# Patient Record
Sex: Female | Born: 1955 | Race: White | Hispanic: No | Marital: Married | Smoking: Former smoker
Health system: Southern US, Community
[De-identification: ages and names within clinical notes are randomized; demographics above are authoritative.]

## PROBLEM LIST (undated history)

## (undated) DIAGNOSIS — M810 Age-related osteoporosis without current pathological fracture: Secondary | ICD-10-CM

## (undated) DIAGNOSIS — N200 Calculus of kidney: Secondary | ICD-10-CM

## (undated) DIAGNOSIS — K862 Cyst of pancreas: Secondary | ICD-10-CM

## (undated) DIAGNOSIS — R978 Other abnormal tumor markers: Secondary | ICD-10-CM

## (undated) DIAGNOSIS — T7840XA Allergy, unspecified, initial encounter: Secondary | ICD-10-CM

## (undated) DIAGNOSIS — I10 Essential (primary) hypertension: Secondary | ICD-10-CM

## (undated) DIAGNOSIS — E785 Hyperlipidemia, unspecified: Secondary | ICD-10-CM

## (undated) DIAGNOSIS — R42 Dizziness and giddiness: Secondary | ICD-10-CM

## (undated) HISTORY — DX: Other abnormal tumor markers: R97.8

## (undated) HISTORY — DX: Cyst of pancreas: K86.2

## (undated) HISTORY — PX: CARDIAC CATHETERIZATION: SHX172

## (undated) HISTORY — DX: Essential (primary) hypertension: I10

## (undated) HISTORY — PX: COLONOSCOPY: SHX174

## (undated) HISTORY — DX: Calculus of kidney: N20.0

## (undated) HISTORY — DX: Allergy, unspecified, initial encounter: T78.40XA

## (undated) HISTORY — DX: Hyperlipidemia, unspecified: E78.5

---

## 2014-07-29 LAB — CBC AND DIFFERENTIAL
HEMATOCRIT: 44 (ref 36–46)
HEMOGLOBIN: 14.8 (ref 12.0–16.0)
Platelets: 219 (ref 150–399)
WBC: 5.5

## 2014-07-29 LAB — LIPID PANEL
Cholesterol: 243 — AB (ref 0–200)
HDL: 85 — AB (ref 35–70)
LDL CALC: 136
TRIGLYCERIDES: 74 (ref 40–160)

## 2014-07-29 LAB — HEPATIC FUNCTION PANEL
ALT: 14 (ref 7–35)
AST: 17 (ref 13–35)
Alkaline Phosphatase: 96 (ref 25–125)
BILIRUBIN, TOTAL: 0.6

## 2014-07-29 LAB — BASIC METABOLIC PANEL
BUN: 9 (ref 4–21)
Creatinine: 0.7 (ref 0.5–1.1)
Glucose: 90
Potassium: 3.7 (ref 3.4–5.3)
SODIUM: 145 (ref 137–147)

## 2014-07-29 LAB — TSH: TSH: 0.53 (ref 0.41–5.90)

## 2015-11-08 LAB — HEPATIC FUNCTION PANEL
ALK PHOS: 94 (ref 25–125)
ALT: 14 (ref 7–35)
AST: 17 (ref 13–35)

## 2015-11-08 LAB — CBC AND DIFFERENTIAL
HEMATOCRIT: 43 (ref 36–46)
HEMOGLOBIN: 14.7 (ref 12.0–16.0)
Platelets: 225 (ref 150–399)
WBC: 5.6

## 2015-11-08 LAB — LIPID PANEL
Cholesterol: 276 — AB (ref 0–200)
HDL: 109 — AB (ref 35–70)
LDL CALC: 157
TRIGLYCERIDES: 51 (ref 40–160)

## 2015-11-08 LAB — TSH: TSH: 0.65 (ref 0.41–5.90)

## 2015-11-08 LAB — BASIC METABOLIC PANEL
BUN: 11 (ref 4–21)
Creatinine: 0.7 (ref 0.5–1.1)
GLUCOSE: 84
Potassium: 3.9 (ref 3.4–5.3)
Sodium: 145 (ref 137–147)

## 2017-04-03 ENCOUNTER — Ambulatory Visit (INDEPENDENT_AMBULATORY_CARE_PROVIDER_SITE_OTHER): Payer: No Typology Code available for payment source | Admitting: Family Medicine

## 2017-04-03 ENCOUNTER — Encounter: Payer: Self-pay | Admitting: Family Medicine

## 2017-04-03 VITALS — BP 110/68 | HR 86 | Resp 12 | Ht 63.5 in | Wt 96.1 lb

## 2017-04-03 DIAGNOSIS — E785 Hyperlipidemia, unspecified: Secondary | ICD-10-CM

## 2017-04-03 DIAGNOSIS — I10 Essential (primary) hypertension: Secondary | ICD-10-CM | POA: Diagnosis not present

## 2017-04-03 DIAGNOSIS — K862 Cyst of pancreas: Secondary | ICD-10-CM

## 2017-04-03 DIAGNOSIS — N83202 Unspecified ovarian cyst, left side: Secondary | ICD-10-CM | POA: Diagnosis not present

## 2017-04-03 DIAGNOSIS — Z8249 Family history of ischemic heart disease and other diseases of the circulatory system: Secondary | ICD-10-CM | POA: Diagnosis not present

## 2017-04-03 MED ORDER — AMLODIPINE BESYLATE 2.5 MG PO TABS: 2.5000 mg | ORAL_TABLET | Freq: Every day | ORAL | 0 refills | Status: DC

## 2017-04-03 MED ORDER — ATORVASTATIN CALCIUM 10 MG PO TABS: 10.0000 mg | ORAL_TABLET | Freq: Every day | ORAL | 0 refills | Status: DC

## 2017-04-03 NOTE — Progress Notes (Signed)
HPI:   [name] is a 61 y.o. female, who is here today to establish care with me.  Former PCP: Dr. Sheral Flow. She moved from [place] in 01/2017. Last preventive routine visit: 01/03/17.  Chronic medical problems: HTN,HLD,FHx of CAD, nephrolithiasis, "fainting spells with negative work-up (including cardiac cath and "numerous test" during hospitalization (03/2016).  She lives with her husband. She exercises regularly and follow a healthy diet.  Hypertension:   Diagnosed around 8-9 years ago. Currently on Amlodipine 2.5 mg daily  She follows low salt diet. Last eye exam within a year. BP readings: 110's-120's/60's She is taking medications as instructed, no side effects reported.  She has not noted unusual headache, visual changes, exertional chest pain, dyspnea,  focal weakness, or edema.   Lab Results  Component Value Date   CREATININE 0.7 11/08/2015   BUN 11 11/08/2015   NA 145 11/08/2015   K 3.9 11/08/2015     Hyperlipidemia: Currently on Atorvastatin 10 mg daily. Following a low fat diet: Yes  She has not noted side effects with medication.  12/2016 last FLP and started statin medication in 01/2017. Denies PMHx of CVD.  FHx positive for CAD, she is not on Aspirin because did not tolerate it well.  Concerns today: Ovarian cyst and pancreatic cyst + CA 19-9   Today she presents copy of abdominal MRI she had done on 12/05/2015. She also had CA 19-9 done, it was elevated at 41. According to patient she was already evaluated by GI and there was not a concern about imaging and laboratory findings. She is asymptomatic, denies alcohol abuse or tobacco use (former smoker).   Her father died from pancreatic cancer at age 11. According to patient, he was exposed to different chemicals that could have contributed to illness.  She denies any abdominal pain, nausea, vomiting, or abnormal weight loss. She is not sure was the indication for abdominal MRI and/or CA  19-9.  She is also reporting incidental ovarian cyst that was seeing when she was undergoing workup for nephrolithiasis in 07/2016. According to patient, a left ovarian cyst was found, 6 months follow-up was recommended. She denies pelvic pain, vaginal bleeding, or abnormal vaginal discharge.   Her mother was recently Dx with "ovarian mass." According to patient, surgeon is certain it is malignant but given her age surgical options have not been considered yet.    Review of Systems  Constitutional: Negative for activity change, appetite change, fatigue, fever and unexpected weight change.  HENT: Negative for mouth sores, nosebleeds, sore throat and trouble swallowing.   Eyes: Negative for redness and visual disturbance.  Respiratory: Negative for cough, shortness of breath and wheezing.   Cardiovascular: Negative for chest pain, palpitations and leg swelling.  Gastrointestinal: Negative for abdominal pain, nausea and vomiting.       Negative for changes in bowel habits.  Endocrine: Negative for cold intolerance and heat intolerance.  Genitourinary: Negative for decreased urine volume, difficulty urinating, dysuria, hematuria, pelvic pain, vaginal bleeding and vaginal discharge.  Musculoskeletal: Negative for gait problem and myalgias.  Skin: Negative for pallor and rash.  Neurological: Positive for light-headedness (occasionally). Negative for seizures, syncope, weakness and headaches.  Hematological: Negative for adenopathy. Does not bruise/bleed easily.  Psychiatric/Behavioral: Negative for confusion. The patient is nervous/anxious.     No current outpatient prescriptions on file prior to visit.   No current facility-administered medications on file prior to visit.      Past Medical History:  Diagnosis  Date  . Hyperlipidemia   . Hypertension   . Kidney stones    Allergies  Allergen Reactions  . Aspirin   . Codeine     Family History  Problem Relation Age of Onset  .  Cancer Father 61       pancreatic  . Cancer Sister   . COPD Sister   . Heart disease Sister   . Mental illness Sister   . Heart disease Brother 9       MI  . Heart disease Paternal Grandfather     Social History   Social History  . Marital status: Married    Spouse name: N/A  . Number of children: 2  . Years of education: N/A   Social History Main Topics  . Smoking status: Former Smoker    Types: Cigarettes    Quit date: 04/04/1987  . Smokeless tobacco: Never Used  . Alcohol use Yes     Comment: rare  . Drug use: No  . Sexual activity: Not Asked   Other Topics Concern  . None   Social History Narrative  . None    Vitals:   04/03/17 1155  BP: 110/68  Pulse: 86  Resp: 12  SpO2: 99%    Body mass index is 16.76 kg/m.  Physical Exam  Nursing note and vitals reviewed. Constitutional: She is oriented to person, place, and time. She appears well-developed. No distress.  HENT:  Head: Normocephalic and atraumatic.  Mouth/Throat: Oropharynx is clear and moist and mucous membranes are normal.  Eyes: Pupils are equal, round, and reactive to light. Conjunctivae and EOM are normal.  Neck: No tracheal deviation present. No thyroid mass and no thyromegaly present.  Cardiovascular: Normal rate and regular rhythm.   No murmur heard. Pulses:      Dorsalis pedis pulses are 2+ on the right side, and 2+ on the left side.  Respiratory: Effort normal and breath sounds normal. No respiratory distress.  GI: Soft. She exhibits no mass. There is no hepatomegaly. There is no tenderness.  Musculoskeletal: She exhibits no edema or tenderness.  Lymphadenopathy:    She has no cervical adenopathy.  Neurological: She is alert and oriented to person, place, and time. She has normal strength. Coordination and gait normal.  Skin: Skin is warm. No erythema.  Psychiatric: Her mood appears anxious.  Well groomed, good eye contact.    ASSESSMENT AND PLAN:   [name] was seen today for  establish care.  Diagnoses and all orders for this visit:  Ovarian cyst, left  We discussed possible etiologies, explained that most of the time of ovarian cyst are transient and benign. Further recommendations will be given according to [place] results.  Transvaginal Non-OB; Future  Cyst of pancreas  Discussed interpretation of cancer markers as well as current recommendations for pancreatic cancer. She is reporting "normal" CA 125.  6 months follow-up with abdominal MRI was recommended, it will be arranged.  -     MR Abdomen W Wo Contrast; Future  Hypertension, essential, benign  Adequately controlled. No changes in current management. DASH-low salt diet to continue. Eye exam recommended annually. F/U in 3-4 months, before if needed.  -     amLODipine (NORVASC) 2.5 MG tablet; Take 1 tablet (2.5 mg total) by mouth daily.  Hyperlipidemia, unspecified hyperlipidemia type -     atorvastatin (LIPITOR) 10 MG tablet; Take 1 tablet (10 mg total) by mouth daily.  FHx: coronary artery disease  We discussed primary prevention for  CAD. She has not tolerated aspirin in the past. Continue healthy lifestyle. Continue startin medication.    45 min face to face OV. > 50% was dedicated to discussion of Dx, prognosis, prevention in general, signs and symptoms of pancreatic masses/malignancy as well as risk factors, and some side effects of medications she is currently taking, and coordination of care.       Jalayna Josten G. [place], MD  [facility]. [place] office.

## 2017-04-03 NOTE — Patient Instructions (Signed)
A few things to remember from today's visit:   Ovarian cyst, left - Plan: US Transvaginal Non-OB  Cyst of pancreas - Plan: MR Abdomen W Wo Contrast   Please be sure medication list is accurate. If a new problem present, please set up appointment sooner than planned today.

## 2017-04-04 ENCOUNTER — Telehealth: Payer: Self-pay | Admitting: Family Medicine

## 2017-04-04 ENCOUNTER — Other Ambulatory Visit: Payer: Self-pay | Admitting: Family Medicine

## 2017-04-04 ENCOUNTER — Encounter: Payer: Self-pay | Admitting: Family Medicine

## 2017-04-04 DIAGNOSIS — N83209 Unspecified ovarian cyst, unspecified side: Secondary | ICD-10-CM

## 2017-04-04 NOTE — Telephone Encounter (Signed)
Arena with [facility] is calling stating that when aTrans-Vaginal [place] is ordered, there has to be a Pelvic Complete with it.  Please add the Pelvic Complete so this can be scheduled for the patient.  The code is [id number].

## 2017-04-04 NOTE — Telephone Encounter (Signed)
Arena with [facility] called back and stated that they are not in-network with the patient's insurance.  A new order needs to be placed and sent to [facility] at fax # [phone].

## 2017-04-04 NOTE — Telephone Encounter (Signed)
Orders placed with facility request of [facility]

## 2017-04-04 NOTE — Addendum Note (Signed)
Addended by: Beckie Busing on: 04/04/2017 10:44 AM   Modules accepted: Orders

## 2017-04-04 NOTE — Telephone Encounter (Signed)
Per Neoma Laming the orders for ultrasounds must be called in to [facility] and a signed order must be faxed.

## 2017-04-04 NOTE — Telephone Encounter (Signed)
Order placed

## 2017-04-05 NOTE — Telephone Encounter (Signed)
Orders placed on MD's desk to be signed.

## 2017-04-05 NOTE — Telephone Encounter (Signed)
Orders faxed

## 2017-04-09 ENCOUNTER — Encounter: Payer: Self-pay | Admitting: Family Medicine

## 2017-04-19 ENCOUNTER — Telehealth: Payer: Self-pay | Admitting: Family Medicine

## 2017-04-19 DIAGNOSIS — N83202 Unspecified ovarian cyst, left side: Secondary | ICD-10-CM

## 2017-04-19 NOTE — Telephone Encounter (Addendum)
Pt is calling had pelvic ultrasound on 04/03/17 and would like the results. Pt is aware md out of office

## 2017-04-19 NOTE — Telephone Encounter (Signed)
I have not seen report yet and it is not in EPIC.  Thanks, BJ

## 2017-04-23 NOTE — Telephone Encounter (Signed)
We had to fax this order, they will fax [place] the results. We haven't received them yet.

## 2017-04-26 NOTE — Telephone Encounter (Signed)
I called and spoke with patient. We went over the results of her imaging and she verbalized understanding. Referral to GYN placed.

## 2017-05-09 ENCOUNTER — Encounter: Payer: Self-pay | Admitting: Family Medicine

## 2017-05-14 ENCOUNTER — Encounter: Payer: Self-pay | Admitting: Gynecology

## 2017-05-14 ENCOUNTER — Ambulatory Visit (INDEPENDENT_AMBULATORY_CARE_PROVIDER_SITE_OTHER): Payer: No Typology Code available for payment source | Admitting: Gynecology

## 2017-05-14 VITALS — BP 112/76 | Ht 62.5 in | Wt 95.0 lb

## 2017-05-14 DIAGNOSIS — N952 Postmenopausal atrophic vaginitis: Secondary | ICD-10-CM

## 2017-05-14 DIAGNOSIS — Z01411 Encounter for gynecological examination (general) (routine) with abnormal findings: Secondary | ICD-10-CM | POA: Diagnosis not present

## 2017-05-14 DIAGNOSIS — N83202 Unspecified ovarian cyst, left side: Secondary | ICD-10-CM | POA: Diagnosis not present

## 2017-05-14 NOTE — Progress Notes (Signed)
Monica Grant Apr 02, 1956 [id number]        61 y.o.  G2P2 new patient for annual gynecologic exam.  Patient relates having been followed in [place] for a left ovarian cyst that was found incidentally approximately 1 year ago on CT scan when she was being evaluated for renal lithiasis. She's had the ultrasound repeated at 6 month intervals and reports that has not changed. She also reports a negative CA 125. Ultrasound ordered last month by her primary physician showed uterus normal size with small 1.5 cm myoma. Endometrial echo 2 mm. Right ovary with 1.7 x 1.1 x 1.4 simple cyst left ovary with 4.2 x 2.6 x 4.1 cyst with small echogenic focus 4 mm. The patient's mother in her 72s underwent surgery yesterday and was found to have what they think is ovarian cancer spread throughout the pelvis. No other family history of ovarian cancer for breast cancer.  Past medical history,surgical history, problem list, medications, allergies, family history and social history were all reviewed and documented as reviewed in the EPIC chart.  ROS:  Performed with pertinent positives and negatives included in the history, assessment and plan.   Additional significant findings :  None   Exam: Monica Grant assistant Vitals:   05/14/17 1354  BP: 112/76  Weight: 95 lb (43.1 kg)  Height: 5' 2.5" (1.588 m)   Body mass index is 17.1 kg/m.  General appearance:  Normal affect, orientation and appearance. Skin: Grossly normal HEENT: Without gross lesions.  No cervical or supraclavicular adenopathy. Thyroid normal.  Lungs:  Clear without wheezing, rales or rhonchi Cardiac: RR, without RMG Abdominal:  Soft, nontender, without masses, guarding, rebound, organomegaly or hernia Breasts:  Examined lying and sitting without masses, retractions, discharge or axillary adenopathy. Pelvic:  Ext, BUS, Vagina: With atrophic changes  Cervix: With atrophic changes  Uterus: Anteverted, normal size, shape and contour, midline  and mobile nontender   Adnexa: Without masses or tenderness    Anus and perineum: Normal   Rectovaginal: Normal sphincter tone without palpated masses or tenderness.    Assessment/Plan:  61 y.o. G2P2 female for annual gynecologic exam.   1. Left ovarian cyst followed over the past year. Do not have copies of the prior ultrasounds for comparison. Most recently measured at 4 cm with small echogenic area 4 mm. Recommend checking CA-125 today and patient to get me a copy of her last ultrasound done in [place]. We reviewed the issues of expectant versus surgical intervention with ovarian cysts and the risks versus benefits of each choice. Patient understands we cannot guarantee pathology without surgery but if a cyst remains unchanged over months observation, has a negative CA 125 unlikely it is a malignancy but cannot guarantee. This is weighed against risks of surgery. I reviewed what would be involved with a laparoscopic BSO as far as the surgery and recovery period. Given patient's most recent diagnoses the patient now is leaning towards intervention which I certainly think is reasonable. She does want to keep thinking about this for now and she'll get the CA-125 drawn and a copy of the ultrasound report to me. She knows to call me if she does not hear from me in several weeks to know that I received the report and have reviewed it.  I also did discuss the possibility of a genetic linkage and genetic testing. Patient will obtain a definitive diagnoses to include histology from her mother's physicians and then we can discuss whether she wants to pursue genetic testing. 2.  Postmenopausal/atrophic genital changes. Patient without significant hot flushes, night sweats, vaginal dryness or any vaginal bleeding. Continue to monitor report any issues or bleeding. 3. Pap smear reported 06/2016. No Pap smear done today. Patient has no history of abnormal Pap smears. We'll plan repeat Pap smear was frequent interval  per current screening guidelines. 4. Mammography coming due in November she will schedule this. Breast exam normal today. 5. Colonoscopy 2017. Repeat at their recommended interval. 6. DEXA 3-4 years ago.  No treatment or intervention was recommended. Will plan repeat DEXA in another 1-2 years at a 5 year interval. 7. Health maintenance. No routine lab work done as patient reports this done at her primary physician's office. Follow up with ultrasound report from her physician. Follow up with decision as far as surgery. Follow up in one year for annual exam.  Additional time in excess of her routine gynecologic exam was spent in direct face to face counseling and coordination of care in regards to her ovarian cyst.    Anastasio Auerbach MD, 2:52 PM 05/14/2017

## 2017-05-14 NOTE — Patient Instructions (Signed)
Forward a copy of the your last ultrasound in [place] to my office.  Call if you do not hear from me within several weeks after reviewing the ultrasound report so that we can formulate a plan.  Your coming due for your mammogram in November.

## 2017-05-15 LAB — CA 125: CA 125: 8 U/mL (ref <35)

## 2017-07-08 ENCOUNTER — Telehealth: Payer: Self-pay | Admitting: Gynecology

## 2017-07-08 NOTE — Telephone Encounter (Signed)
Patient I received a copy of the ultrasound that was done December 2017 through [facility].  That showed both a right and left ovarian cyst.  The left ovarian cyst was approximately 4 cm simple and benign in appearance.  Follow-up ultrasound through [facility] in August showed the left cyst to be approximately 4 cm without significant change in size.  It did have one small area not seen previously but I suspect that this is not a significant change.  We talked about surgery at her office visit versus monitoring.  If she does decide just to follow this and I would recommend a follow-up ultrasound in February which will be 6 months from her November ultrasound to relook at these areas.  If she is interested in proceeding with the laparoscopic removal of her ovaries then we can arrange to schedule this also.  The bottom line disclaimer is if we continue observing no one can guarantee this is not cancer but it would be highly unlikely to remain unchanged for such a long period of time and most likely represents a benign tumor

## 2017-07-09 ENCOUNTER — Encounter: Payer: Self-pay | Admitting: *Deleted

## 2017-07-09 NOTE — Telephone Encounter (Signed)
Sent pt mychart message

## 2017-07-13 ENCOUNTER — Other Ambulatory Visit: Payer: Self-pay | Admitting: Family Medicine

## 2017-07-13 DIAGNOSIS — E785 Hyperlipidemia, unspecified: Secondary | ICD-10-CM

## 2017-07-13 DIAGNOSIS — I10 Essential (primary) hypertension: Secondary | ICD-10-CM

## 2017-07-15 NOTE — Telephone Encounter (Signed)
Patient received mychart message.

## 2017-10-02 ENCOUNTER — Other Ambulatory Visit: Payer: Self-pay | Admitting: Family Medicine

## 2017-10-02 DIAGNOSIS — E785 Hyperlipidemia, unspecified: Secondary | ICD-10-CM

## 2017-10-02 DIAGNOSIS — I10 Essential (primary) hypertension: Secondary | ICD-10-CM

## 2017-10-02 MED ORDER — ATORVASTATIN CALCIUM 10 MG PO TABS: 10.0000 mg | ORAL_TABLET | Freq: Every day | ORAL | 0 refills | Status: DC

## 2017-10-02 MED ORDER — AMLODIPINE BESYLATE 2.5 MG PO TABS: 2.5000 mg | ORAL_TABLET | Freq: Every day | ORAL | 0 refills | Status: DC

## 2018-01-02 ENCOUNTER — Encounter: Payer: Self-pay | Admitting: Family Medicine

## 2018-01-02 ENCOUNTER — Other Ambulatory Visit: Payer: Self-pay | Admitting: Family Medicine

## 2018-01-02 DIAGNOSIS — I10 Essential (primary) hypertension: Secondary | ICD-10-CM

## 2018-01-03 MED ORDER — AMLODIPINE BESYLATE 2.5 MG PO TABS: 2.5000 mg | ORAL_TABLET | Freq: Every day | ORAL | 0 refills | Status: DC

## 2018-01-08 ENCOUNTER — Encounter: Payer: No Typology Code available for payment source | Admitting: Family Medicine

## 2018-01-13 NOTE — Progress Notes (Signed)
HPI:   [name] is a 62 y.o. female, who is here today for her routine physical.  Last CPE: 12/2016  Regular exercise 3 or more time per week: Not consistently. Following a healthy diet: Yes She lives with her husband.   Pap smear 04/2016. She follows with gyn, Dr Monica Grant.  Immunization History  Administered Date(s) Administered  . Tdap 01/14/2018   She already got her first shingles vaccine, Shingrix.  Mammogram: Mammogram in 06/2017.  Colonoscopy: In 01/2016, 5-year follow-up recommended. DEXA: N/A.  She is reporting DEXA done a few years ago, she is not sure about results.  Hep C screening: 04/2016 NR  Follow-up/concerns today:  Hyperlipidemia:  Currently on Lipitor 10 mg daily. Following a low fat diet: Yes.  She has not noted side effects with medication.  Lab Results  Component Value Date   CHOL 276 (A) 11/08/2015   HDL 109 (A) 11/08/2015   LDLCALC 157 11/08/2015   TRIG 51 11/08/2015    -She is requesting CA 19-9, which has been mildly elevated in the past. She is also inquiring about pancreatic imaging, MRI. Abdominal MRI was ordered in 03/2017, apparently her insurance did not cover imaging at [facility] for imaging, so order was faxed to try at imaging per patient request. She did here about appointment, did not have it done.  4 mm pancreatic cystic structure seen on MRI 12/02/15.CA 19-9 was 41,and 6 months f/u was recommended.Asymptomatic.     Denies nausea, vomiting, abdominal pain, or abnormal weight loss.  Concern about wound BP mildly elevated today, history of hypertension. Currently she is on Amlodipine 2.5 mg daily. She follows low-salt diet. Denies severe/frequent headache, visual changes, chest pain, dyspnea, palpitation, claudication, focal weakness, or edema.   Review of Systems  Constitutional: Negative for appetite change, fatigue, fever and unexpected weight change.  HENT: Negative for dental problem, hearing loss, mouth  sores, sore throat, trouble swallowing and voice change.   Eyes: Negative for photophobia and visual disturbance.  Respiratory: Negative for cough, shortness of breath and wheezing.   Cardiovascular: Negative for chest pain and leg swelling.  Gastrointestinal: Negative for abdominal pain, nausea and vomiting.       No changes in bowel habits.  Endocrine: Negative for cold intolerance, heat intolerance, polydipsia, polyphagia and polyuria.  Genitourinary: Negative for decreased urine volume, dysuria, hematuria, vaginal bleeding and vaginal discharge.  Musculoskeletal: Negative for gait problem and myalgias.  Skin: Negative for color change and rash.  Allergic/Immunologic: Negative for environmental allergies.  Neurological: Negative for syncope, weakness and headaches.  Hematological: Negative for adenopathy. Does not bruise/bleed easily.  Psychiatric/Behavioral: Negative for confusion and sleep disturbance. The patient is not nervous/anxious.   All other systems reviewed and are negative.     Current Outpatient Medications on File Prior to Visit  Medication Sig Dispense Refill  . amLODipine (NORVASC) 2.5 MG tablet Take 1 tablet (2.5 mg total) by mouth daily. 90 tablet 0  . atorvastatin (LIPITOR) 10 MG tablet Take 1 tablet (10 mg total) by mouth daily. 90 tablet 0  . Cholecalciferol (VITAMIN D3) 2000 units TABS Take 1 tablet by mouth daily.     No current facility-administered medications on file prior to visit.      Past Medical History:  Diagnosis Date  . Hyperlipidemia   . Hypertension   . Kidney stones     Past Surgical History:  Procedure Laterality Date  . CARDIAC CATHETERIZATION      Allergies  Allergen  Reactions  . Aspirin Other (See Comments)    GI upset  . Codeine Other (See Comments)    GI upset    Family History  Problem Relation Age of Onset  . Cancer Father 64       pancreatic  . Cancer Sister        ? type  . COPD Sister   . Heart disease Sister     . Mental illness Sister   . Heart disease Brother 75       MI  . Heart disease Paternal Grandfather   . Ovarian cancer Mother   . Cancer Mother        Lymphoma    Social History   Socioeconomic History  . Marital status: Married    Spouse name: Not on file  . Number of children: 2  . Years of education: Not on file  . Highest education level: Not on file  Occupational History  . Not on file  Social Needs  . Financial resource strain: Not on file  . Food insecurity:    Worry: Not on file    Inability: Not on file  . Transportation needs:    Medical: Not on file    Non-medical: Not on file  Tobacco Use  . Smoking status: Former Smoker    Types: Cigarettes    Last attempt to quit: 04/04/1987    Years since quitting: 30.8  . Smokeless tobacco: Never Used  Substance and Sexual Activity  . Alcohol use: No  . Drug use: No  . Sexual activity: Yes    Comment: 1st intercourse 58 yo-1 partner  Lifestyle  . Physical activity:    Days per week: Not on file    Minutes per session: Not on file  . Stress: Not on file  Relationships  . Social connections:    Talks on phone: Not on file    Gets together: Not on file    Attends religious service: Not on file    Active member of club or organization: Not on file    Attends meetings of clubs or organizations: Not on file    Relationship status: Not on file  Other Topics Concern  . Not on file  Social History Narrative  . Not on file     Vitals:   01/14/18 0748  BP: 140/84  Pulse: 67  Temp: (!) 97.5 F (36.4 C)  SpO2: 99%   Body mass index is 17.68 kg/m.   Wt Readings from Last 3 Encounters:  01/14/18 98 lb 4 oz (44.6 kg)  05/14/17 95 lb (43.1 kg)  04/03/17 96 lb 2 oz (43.6 kg)      Physical Exam  Nursing note and vitals reviewed. Constitutional: She is oriented to person, place, and time. She appears well-developed. No distress.  HENT:  Head: Normocephalic and atraumatic.  Right Ear: Hearing, tympanic  membrane, external ear and ear canal normal.  Left Ear: Hearing, tympanic membrane, external ear and ear canal normal.  Mouth/Throat: Uvula is midline, oropharynx is clear and moist and mucous membranes are normal.  Eyes: Pupils are equal, round, and reactive to light. Conjunctivae and EOM are normal.  Neck: No tracheal deviation present. No thyromegaly present.  Cardiovascular: Normal rate and regular rhythm.  No murmur heard. Pulses:      Dorsalis pedis pulses are 2+ on the right side, and 2+ on the left side.  Respiratory: Effort normal and breath sounds normal. No respiratory distress.  GI: Soft. She exhibits  no mass. There is no hepatomegaly. There is no tenderness.  Genitourinary:  Genitourinary Comments: Deferred to gynecologist.  Musculoskeletal: She exhibits no edema or tenderness.  No major deformity or signs of synovitis appreciated.  Lymphadenopathy:    She has no cervical adenopathy.       Right: No supraclavicular adenopathy present.       Left: No supraclavicular adenopathy present.  Neurological: She is alert and oriented to person, place, and time. She has normal strength. No cranial nerve deficit. Coordination and gait normal.  Reflex Scores:      Bicep reflexes are 2+ on the right side and 2+ on the left side.      Patellar reflexes are 2+ on the right side and 2+ on the left side. Skin: Skin is warm. No rash noted. No erythema.  Psychiatric: She has a normal mood and affect. Cognition and memory are normal.  Well groomed, good eye contact.      ASSESSMENT AND PLAN:  [name] was here today annual physical examination.   Orders Placed This Encounter  Procedures  . Tdap vaccine greater than or equal to 7yo IM  . HIV antibody  . Cancer Antigen 19-9  . Comprehensive metabolic panel  . Lipid panel  . EXTRA LAV TOP TUBE   Lab Results  Component Value Date   ALT 17 01/14/2018   AST 22 01/14/2018   ALKPHOS 94 11/08/2015   BILITOT 0.7 01/14/2018    Lab Results  Component Value Date   CREATININE 0.59 01/14/2018   BUN 12 01/14/2018   NA 142 01/14/2018   K 3.7 01/14/2018   CL 106 01/14/2018   CO2 24 01/14/2018   Lab Results  Component Value Date   CHOL 163 01/14/2018   HDL 86 01/14/2018   LDLCALC 65 01/14/2018   TRIG 42 01/14/2018   CHOLHDL 1.9 01/14/2018    Routine general medical examination at a health care facility  We discussed the importance of regular physical activity and healthy diet for prevention of chronic illness and/or complications. Preventive guidelines reviewed. She will continue following with her gynecologist for her female preventive care. Vaccination updated, Tdap given today.  She will give second dose of Shingrix at her pharmacy.  Ca++ 1000 to 1200 mg daily, ideally through her diet and vit D supplementation 2000 units daily to continue. Next CPE in a year.   Hyperlipidemia, unspecified hyperlipidemia type  No changes in current management, will follow labs done today and will give further recommendations accordingly. Follow-up in 6 to 12 months, depending of lab results.  -     Lipid panel  Diabetes mellitus screening -     Comprehensive metabolic panel  Encounter for screening for HIV -     HIV antibody  Hypertension, essential, benign  BP recheck 138/75.  Exline recommend continuing monitoring BP at home. No changes in Amlodipine 2.5 mg daily.  Pancreatic cyst  We discussed current recommendations in regard to pancreatic cancer. Father with pancreatic cancer (3 or more relatives with at least one first degree relative with pancreatic cancer for genetic screening).  She had BRCA 2 screening negative.  All other problems could also cause elevation of CA 19-9. In regard to abdominal MRI to follow-up on pancreatic cyst, we decided to hold on imaging for now and will proceed according to CA 19-9 results.  -     Cancer Antigen 19-9  Need for Tdap vaccination -     Tdap vaccine  greater than  or equal to 7yo IM  Other orders -     EXTRA LAV TOP TUBE    Return in 1 year (on 01/15/2019) for CPE.      Betty G. Martinique, MD  [facility]. [place] office.

## 2018-01-14 ENCOUNTER — Ambulatory Visit (INDEPENDENT_AMBULATORY_CARE_PROVIDER_SITE_OTHER): Payer: No Typology Code available for payment source | Admitting: Family Medicine

## 2018-01-14 ENCOUNTER — Encounter: Payer: Self-pay | Admitting: Family Medicine

## 2018-01-14 VITALS — BP 140/84 | HR 67 | Temp 97.5°F | Ht 62.5 in | Wt 98.2 lb

## 2018-01-14 DIAGNOSIS — Z0000 Encounter for general adult medical examination without abnormal findings: Secondary | ICD-10-CM

## 2018-01-14 DIAGNOSIS — Z114 Encounter for screening for human immunodeficiency virus [HIV]: Secondary | ICD-10-CM | POA: Diagnosis not present

## 2018-01-14 DIAGNOSIS — Z23 Encounter for immunization: Secondary | ICD-10-CM | POA: Diagnosis not present

## 2018-01-14 DIAGNOSIS — I10 Essential (primary) hypertension: Secondary | ICD-10-CM

## 2018-01-14 DIAGNOSIS — K862 Cyst of pancreas: Secondary | ICD-10-CM

## 2018-01-14 DIAGNOSIS — Z131 Encounter for screening for diabetes mellitus: Secondary | ICD-10-CM | POA: Diagnosis not present

## 2018-01-14 DIAGNOSIS — E785 Hyperlipidemia, unspecified: Secondary | ICD-10-CM | POA: Diagnosis not present

## 2018-01-14 NOTE — Patient Instructions (Signed)
A few things to remember from today's visit:   Routine general medical examination at a health care facility  Hyperlipidemia, unspecified hyperlipidemia type - Plan: Lipid panel  Diabetes mellitus screening - Plan: Comprehensive metabolic panel  Encounter for screening for HIV - Plan: HIV antibody  Pancreatic cyst - Plan: Cancer Antigen 19-9  Today you have you routine preventive visit.  At least 150 minutes of moderate exercise per week, daily brisk walking for 15-30 min is a good exercise option. Healthy diet low in saturated (animal) fats and sweets and consisting of fresh fruits and vegetables, lean meats such as fish and white chicken and whole grains.  These are some of recommendations for screening depending of age and risk factors:   - Vaccines:  Tdap vaccine every 10 years. Given today.  Shingles vaccine recommended at age 34, could be given after 62 years of age but not sure about insurance coverage.   Pneumonia vaccines:  Prevnar 13 at 65 and Pneumovax at 14. Sometimes Pneumovax is giving earlier if history of smoking, lung disease,diabetes,kidney disease among some.    Screening for diabetes at age 48 and every 3 years.  Cervical cancer prevention:  Pap smear starts at 62 years of age and continues periodically until 62 years old in low risk women. Pap smear every 3 years between 29 and 2 years old. Pap smear every 3-5 years between women 78 and older if pap smear negative and HPV screening negative.   -Breast cancer: Mammogram: There is disagreement between experts about when to start screening in low risk asymptomatic female but recent recommendations are to start screening at 61 and not later than 62 years old , every 1-2 years and after 62 yo q 2 years. Screening is recommended until 62 years old but some women can continue screening depending of healthy issues.   Colon cancer screening: starts at 62 years old until 62 years old.  Cholesterol disorder  screening at age 64 and every 3 years.  Also recommended:  1. Dental visit- Brush and floss your teeth twice daily; visit your dentist twice a year. 2. Eye doctor- Get an eye exam at least every 2 years. 3. Helmet use- Always wear a helmet when riding a bicycle, motorcycle, rollerblading or skateboarding. 4. Safe sex- If you may be exposed to sexually transmitted infections, use a condom. 5. Seat belts- Seat belts can save your live; always wear one. 6. Smoke/Carbon Monoxide detectors- These detectors need to be installed on the appropriate level of your home. Replace batteries at least once a year. 7. Skin cancer- When out in the sun please cover up and use sunscreen 15 SPF or higher. 8. Violence- If anyone is threatening or hurting you, please tell your healthcare provider.  9. Drink alcohol in moderation- Limit alcohol intake to one drink or less per day. Never drink and drive.   Please be sure medication list is accurate. If a new problem present, please set up appointment sooner than planned today.

## 2018-01-15 ENCOUNTER — Other Ambulatory Visit: Payer: Self-pay | Admitting: Family Medicine

## 2018-01-15 ENCOUNTER — Encounter: Payer: Self-pay | Admitting: Family Medicine

## 2018-01-15 DIAGNOSIS — E785 Hyperlipidemia, unspecified: Secondary | ICD-10-CM

## 2018-01-15 LAB — COMPREHENSIVE METABOLIC PANEL
AG RATIO: 1.8 (calc) (ref 1.0–2.5)
ALBUMIN MSPROF: 4.9 g/dL (ref 3.6–5.1)
ALT: 17 U/L (ref 6–29)
AST: 22 U/L (ref 10–35)
Alkaline phosphatase (APISO): 100 U/L (ref 33–130)
BILIRUBIN TOTAL: 0.7 mg/dL (ref 0.2–1.2)
BUN: 12 mg/dL (ref 7–25)
CALCIUM: 9.8 mg/dL (ref 8.6–10.4)
CHLORIDE: 106 mmol/L (ref 98–110)
CO2: 24 mmol/L (ref 20–32)
Creat: 0.59 mg/dL (ref 0.50–0.99)
Globulin: 2.7 g/dL (calc) (ref 1.9–3.7)
Glucose, Bld: 82 mg/dL (ref 65–99)
POTASSIUM: 3.7 mmol/L (ref 3.5–5.3)
SODIUM: 142 mmol/L (ref 135–146)
Total Protein: 7.6 g/dL (ref 6.1–8.1)

## 2018-01-15 LAB — HIV ANTIBODY (ROUTINE TESTING W REFLEX): HIV: NONREACTIVE

## 2018-01-15 LAB — EXTRA LAV TOP TUBE

## 2018-01-15 LAB — LIPID PANEL
CHOLESTEROL: 163 mg/dL (ref <200)
HDL: 86 mg/dL (ref >50)
LDL Cholesterol (Calc): 65 mg/dL (calc)
Non-HDL Cholesterol (Calc): 77 mg/dL (calc) (ref <130)
Total CHOL/HDL Ratio: 1.9 (calc) (ref <5.0)
Triglycerides: 42 mg/dL (ref <150)

## 2018-01-15 LAB — CANCER ANTIGEN 19-9: CA 19-9: 44 U/mL — ABNORMAL HIGH (ref <34)

## 2018-01-15 MED ORDER — ATORVASTATIN CALCIUM 10 MG PO TABS: 10.0000 mg | ORAL_TABLET | Freq: Every day | ORAL | 0 refills | Status: DC

## 2018-01-20 ENCOUNTER — Encounter: Payer: Self-pay | Admitting: Family Medicine

## 2018-02-06 ENCOUNTER — Telehealth: Payer: Self-pay | Admitting: *Deleted

## 2018-02-06 DIAGNOSIS — N83202 Unspecified ovarian cyst, left side: Secondary | ICD-10-CM

## 2018-02-06 NOTE — Telephone Encounter (Signed)
patient was to follow up for repeat ultrasound in feb 2019, called today wanting to schedule this per your note on 07/08/17. Okay to still schedule? Please advise

## 2018-02-06 NOTE — Addendum Note (Signed)
Addended by: Thamas Jaegers on: 02/06/2018 02:15 PM   Modules accepted: Orders

## 2018-02-06 NOTE — Telephone Encounter (Signed)
Patient does not want to schedule [place] here she wants to be referred to outside provider.

## 2018-02-06 NOTE — Telephone Encounter (Signed)
Order placed, claudia can you call and schedule for patient

## 2018-02-06 NOTE — Telephone Encounter (Signed)
Yes okay to schedule ultrasound

## 2018-02-17 ENCOUNTER — Encounter: Payer: Self-pay | Admitting: Gynecology

## 2018-02-17 NOTE — Telephone Encounter (Signed)
Okay to place order.  Reference follow-up ovarian cysts

## 2018-02-17 NOTE — Telephone Encounter (Signed)
Dr. Loetta Rough- May I provide order to go to [facility] for u/s?

## 2018-02-26 ENCOUNTER — Telehealth: Payer: Self-pay | Admitting: Gynecology

## 2018-02-26 ENCOUNTER — Encounter: Payer: Self-pay | Admitting: Gynecology

## 2018-02-26 NOTE — Telephone Encounter (Signed)
Tell patient I received her ultrasound report.  The left ovarian cyst measured 4.2 x 2.6 cm previously.  Most recent measurement was 5.1 x 3 cm.  The radiologist reports that there was no significant change in this cystic area.  We do except some error within the measurements so 4.2 to 5.1 is not really considered a significant change.  Highly unlikely malignant but no guarantees.  2 options are to continue to follow this or proceed with laparoscopic removal which given her age I would recommend removing both ovaries and fallopian tubes.  If she would like to proceed with surgery then her 2 options would be office visit for discussion now and then we will schedule or Monica Grant can schedule it now and she would present for a preoperative consult where we will go over the risks and what is involved with this.  It would be considered day surgery and go home the same day with approximately a 2-week recovery period afterwards.  If she would prefer to continue with observation then I would recommend follow-up ultrasound 6 months and we can repeat the CA 125 at that time.  Let me know her decision.

## 2018-02-27 ENCOUNTER — Encounter: Payer: Self-pay | Admitting: *Deleted

## 2018-02-27 NOTE — Telephone Encounter (Signed)
Patient said she will call back once decision made.

## 2018-02-27 NOTE — Telephone Encounter (Signed)
Patient informed, asked me to send her a my chart message as well.

## 2018-03-17 ENCOUNTER — Telehealth: Payer: Self-pay | Admitting: *Deleted

## 2018-03-17 ENCOUNTER — Encounter: Payer: Self-pay | Admitting: *Deleted

## 2018-03-17 DIAGNOSIS — N83202 Unspecified ovarian cyst, left side: Secondary | ICD-10-CM

## 2018-03-17 NOTE — Telephone Encounter (Signed)
Patient called back follow up from encounter on 02/26/18, would like to follow-up ultrasound 6 months and we can repeat the CA 125 at that time  order placed for both will have appointment to schedule.

## 2018-04-07 ENCOUNTER — Other Ambulatory Visit: Payer: Self-pay | Admitting: Family Medicine

## 2018-04-07 DIAGNOSIS — E785 Hyperlipidemia, unspecified: Secondary | ICD-10-CM

## 2018-04-07 DIAGNOSIS — I10 Essential (primary) hypertension: Secondary | ICD-10-CM

## 2018-04-08 MED ORDER — AMLODIPINE BESYLATE 2.5 MG PO TABS: 2.5000 mg | ORAL_TABLET | Freq: Every day | ORAL | 0 refills | Status: DC

## 2018-04-08 MED ORDER — ATORVASTATIN CALCIUM 10 MG PO TABS: 10.0000 mg | ORAL_TABLET | Freq: Every day | ORAL | 0 refills | Status: DC

## 2018-04-10 ENCOUNTER — Ambulatory Visit: Payer: Self-pay | Admitting: Family Medicine

## 2018-04-10 NOTE — Telephone Encounter (Signed)
Patient is calling to report that she is experiencing lightheadedness and dizziness. She is so faint that she is unable to walk without assistance. Patient feels she is having irregular heartbeats. BP 170/90 and P 95. Patient's husband advised to call 911 for transport and he agrees.  Reason for Disposition . Unable to walk, or can only walk with assistance (e.g., requires support)  Answer Assessment - Initial Assessment Questions 1. DESCRIPTION: "Please describe your heart rate or heart beat that you are having" (e.g., fast/slow, regular/irregular, skipped or extra beats, "palpitations")     Lightheaded and dizzy- 95 2. ONSET: "When did it start?" (Minutes, hours or days)      3 hours ago- chest pain, soft stools, dizziness 1 hour ago 3. DURATION: "How long does it last" (e.g., seconds, minutes, hours)     Irregular heart beat- now 4. PATTERN "Does it come and go, or has it been constant since it started?"  "Does it get worse with exertion?"   "Are you feeling it now?"     Comes and goes 5. TAP: "Using your hand, can you tap out what you are feeling on a chair or table in front of you, so that I can hear?" (Note: not all patients can do this)       irregular 6. HEART RATE: "Can you tell me your heart rate?" "How many beats in 15 seconds?"  (Note: not all patients can do this)       95 7. RECURRENT SYMPTOM: "Have you ever had this before?" If so, ask: "When was the last time?" and "What happened that time?"      Yes- not as long, a few days ago- patient was at work and has this happen- she has tested- BP medication was adjusted-2 years ago 8. CAUSE: "What do you think is causing the palpitations?"     unsure 9. CARDIAC HISTORY: "Do you have any history of heart disease?" (e.g., heart attack, angina, bypass surgery, angioplasty, arrhythmia)      no 10. OTHER SYMPTOMS: "Do you have any other symptoms?" (e.g., dizziness, chest pain, sweating, difficulty breathing)       Dizziness, chills 11.  PREGNANCY: "Is there any chance you are pregnant?" "When was your last menstrual period?"       n/a  Protocols used: HEART RATE AND HEARTBEAT QUESTIONS-A-AH

## 2018-04-10 NOTE — Telephone Encounter (Signed)
Will monitor for ED arrival.  

## 2018-04-11 ENCOUNTER — Encounter: Payer: Self-pay | Admitting: Family Medicine

## 2018-04-11 ENCOUNTER — Ambulatory Visit: Payer: No Typology Code available for payment source | Admitting: Family Medicine

## 2018-04-11 VITALS — BP 110/66 | HR 75 | Temp 98.0°F | Resp 16 | Ht 62.5 in | Wt 98.2 lb

## 2018-04-11 DIAGNOSIS — R002 Palpitations: Secondary | ICD-10-CM | POA: Diagnosis not present

## 2018-04-11 DIAGNOSIS — R42 Dizziness and giddiness: Secondary | ICD-10-CM | POA: Diagnosis not present

## 2018-04-11 DIAGNOSIS — K862 Cyst of pancreas: Secondary | ICD-10-CM

## 2018-04-11 DIAGNOSIS — I10 Essential (primary) hypertension: Secondary | ICD-10-CM

## 2018-04-11 DIAGNOSIS — R978 Other abnormal tumor markers: Secondary | ICD-10-CM

## 2018-04-11 DIAGNOSIS — R9431 Abnormal electrocardiogram [ECG] [EKG]: Secondary | ICD-10-CM

## 2018-04-11 NOTE — Assessment & Plan Note (Signed)
She has some mildly low BPs but because yesterday BP was elevated, she will continue amlodipine 2.5 mg daily. She will continue monitoring her BP. Follow-up in 3 months.

## 2018-04-11 NOTE — Progress Notes (Signed)
ACUTE VISIT   HPI:  Chief Complaint  Patient presents with  . Follow-up    lightheadness    Monica Grant is a 62 y.o. female, who is here today with her husband complaining of worsening episodes of lightheadedness. According to patient, for years she has had transient lightheadedness when she gets up fast, mainly when she is working on her yard. Yesterday she had an episode of dizziness, she could not walk without assistance associated with palpitations, no chest pain, and BP 176/96.  She was instructed to call 911.  Evaluated by paramedics at home, EKG was done BP recheck (117/97).  Associated nausea, no vomiting.  She has not been under stress.  Home BP readings go from 109-176/60s-70s, more often 120s/60s. She is currently on amlodipine 2.5 mg daily. She has been following low-salt diet and exercising regularly.  She is drinking fluids adequately.  Today she is feeling better.   She also mentions episodes of left-sided chest pain, no radiated, no associated with exception.  She cannot describe type of pain. No associated palpitation or dyspnea.  Usually it last a few seconds.  According to patient, she has had this intermittently for years but is becoming more frequent.  She has not identified exacerbating factors.  Negative for GI symptoms, heartburn, and acid reflux.  She is reporting cardiac catheter done in 03/2016 that was "clear."  On 04/17/2016 she presented to the ER at [facility], complaining of heart palpitations and near syncope.She stayed overnight and cardiac work-up was otherwise negative. She was recommended to follow with cardiologist for a Holter monitor, but she did not do so.  She still has intermittent episodes of palpitations for years, she is not sure if this is related to panic attacks. She has several episodes per week. She has not identified exacerbating factors. Episode may last from seconds to 1 minute. No  associated diaphoresis, she is not sure about associated dyspnea.  She mentions that sometimes when she is talking she feels like she cannot catch her breath, she has no problems walking on flat surveys but she has dyspnea upon walking a hill.  She is also concerned about pancreatic cyst on her family history of pancreatic cancer. Last visit CA 19-9 was ordered upon patient request, this has been slightly elevated in the past. 01/14/18 CA 19-9 was 44. Pancreatic MRI was also ordered but it seems like it was not approved by her insurance.  She reports GI evaluation and imaging done in the past, and according to patient, she was reassured and no further follow-up was recommended. No abdominal pain, nausea, vomiting, or abnormal weight loss. Former smoker.  Lab Results  Component Value Date   ALT 17 01/14/2018   AST 22 01/14/2018   ALKPHOS 94 11/08/2015   BILITOT 0.7 01/14/2018     Review of Systems  Constitutional: Positive for fatigue. Negative for chills, diaphoresis and fever.  HENT: Negative for mouth sores, nosebleeds, sore throat and trouble swallowing.   Respiratory: Positive for shortness of breath. Negative for cough and wheezing.   Cardiovascular: Positive for chest pain and palpitations.  Gastrointestinal: Positive for nausea. Negative for abdominal distention, abdominal pain and vomiting.       No changes in bowel habits.  Endocrine: Negative for cold intolerance and heat intolerance.  Genitourinary: Negative for decreased urine volume and hematuria.  Musculoskeletal: Negative for gait problem and myalgias.  Neurological: Positive for light-headedness. Negative for seizures, syncope and weakness.  Psychiatric/Behavioral:  Negative for confusion. The patient is nervous/anxious.       Current Outpatient Medications on File Prior to Visit  Medication Sig Dispense Refill  . amLODipine (NORVASC) 2.5 MG tablet Take 1 tablet (2.5 mg total) by mouth daily. 90 tablet 0  .  atorvastatin (LIPITOR) 10 MG tablet Take 1 tablet (10 mg total) by mouth daily. 90 tablet 0  . Cholecalciferol (VITAMIN D3) 2000 units TABS Take 1 tablet by mouth daily.     No current facility-administered medications on file prior to visit.      Past Medical History:  Diagnosis Date  . Hyperlipidemia   . Hypertension   . Kidney stones    Past Surgical History:  Procedure Laterality Date  . CARDIAC CATHETERIZATION      Allergies  Allergen Reactions  . Aspirin Other (See Comments)    GI upset  . Codeine Other (See Comments)    GI upset   Family History  Problem Relation Age of Onset  . Cancer Father 28       pancreatic  . Cancer Sister        ? type  . COPD Sister   . Heart disease Sister   . Mental illness Sister   . Heart disease Brother 71       MI  . Heart disease Paternal Grandfather   . Ovarian cancer Mother   . Cancer Mother        Lymphoma    Social History   Socioeconomic History  . Marital status: Married    Spouse name: Not on file  . Number of children: 2  . Years of education: Not on file  . Highest education level: Not on file  Occupational History  . Not on file  Social Needs  . Financial resource strain: Not on file  . Food insecurity:    Worry: Not on file    Inability: Not on file  . Transportation needs:    Medical: Not on file    Non-medical: Not on file  Tobacco Use  . Smoking status: Former Smoker    Types: Cigarettes    Last attempt to quit: 04/04/1987    Years since quitting: 31.0  . Smokeless tobacco: Never Used  Substance and Sexual Activity  . Alcohol use: No  . Drug use: No  . Sexual activity: Yes    Comment: 1st intercourse 89 yo-1 partner  Lifestyle  . Physical activity:    Days per week: Not on file    Minutes per session: Not on file  . Stress: Not on file  Relationships  . Social connections:    Talks on phone: Not on file    Gets together: Not on file    Attends religious service: Not on file    Active  member of club or organization: Not on file    Attends meetings of clubs or organizations: Not on file    Relationship status: Not on file  Other Topics Concern  . Not on file  Social History Narrative  . Not on file    Vitals:   04/11/18 0954  BP: 110/66  Pulse: 75  Resp: 16  Temp: 98 F (36.7 C)  SpO2: 99%   Body mass index is 17.68 kg/m.   Physical Exam  Nursing note and vitals reviewed. Constitutional: She is oriented to person, place, and time. She appears well-developed. No distress.  HENT:  Head: Normocephalic and atraumatic.  Mouth/Throat: Oropharynx is clear and moist and mucous  membranes are normal.  Eyes: Pupils are equal, round, and reactive to light. Conjunctivae are normal.  Cardiovascular: Normal rate and regular rhythm.  No murmur heard. Pulses:      Dorsalis pedis pulses are 2+ on the right side, and 2+ on the left side.  Respiratory: Effort normal and breath sounds normal. No respiratory distress.  GI: Soft. She exhibits no mass. There is no hepatomegaly. There is no tenderness.  Musculoskeletal: She exhibits no edema.  Lymphadenopathy:    She has no cervical adenopathy.  Neurological: She is alert and oriented to person, place, and time. She has normal strength. No cranial nerve deficit. Gait normal.  Skin: Skin is warm. No rash noted. No erythema.  Psychiatric: Her mood appears anxious.  Well groomed, good eye contact.    ASSESSMENT AND PLAN:  Monica Grant was seen today for follow-up.  Orders Placed This Encounter  Procedures  . Basic metabolic panel  . CBC with Differential/Platelet  . TSH  . Cancer Antigen 19-9  . Ambulatory referral to Cardiology  . EKG 12-Lead   Lab Results  Component Value Date   CREATININE 0.68 04/11/2018   BUN 11 04/11/2018   NA 136 04/11/2018   K 3.7 04/11/2018   CL 98 04/11/2018   CO2 24 04/11/2018   Lab Results  Component Value Date   TSH 1.22 04/11/2018   Lab Results  Component Value Date   WBC 5.1  04/11/2018   HGB 15.5 04/11/2018   HCT 46.4 (H) 04/11/2018   MCV 91.7 04/11/2018   PLT 225 04/11/2018    Palpitations  ?  Cardiac arrhythmia vs anxiety. Episode of palpitations do not seem to be associated with chest pain or lightheadedness she is reporting today. Recommend decreasing caffeine intake. EKG done today: SR, normal axis, inverted T waves in septal leads.  I do not have another EKG for comparison.  Currently she is not having chest pain. She was clearly instructed about warning signs. Appointment with cardiologist will be arranged.   -     EKG 12-Lead -     Ambulatory referral to Cardiology -     Basic metabolic panel -     CBC with Differential/Platelet -     TSH  Abnormal EKG -     Ambulatory referral to Cardiology  Intermittent lightheadedness  We discussed possible etiologies, including orthostatic hypotension, dehydration, cardiac arrhythmia from. Fall precautions discussed. Adequate hydration Further recommendations will be given to lab results.  -     Basic metabolic panel -     CBC with Differential/Platelet   Hypertension, essential, benign She has some mildly low BPs but because yesterday BP was elevated, she will continue amlodipine 2.5 mg daily. She will continue monitoring her BP. Follow-up in 3 months.  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 Asymptomatic Last CA-19-9 was mildly elevated at 44.  History of cystic focus left ovary, last pelvic US in 02/2018: No significant changes.  She follows with gynecologist. CA 125 normal at 8 in 04/2017.  We will repeat CA 19-9 today and make recommendations accordingly.    40 min face to face OV. > 50% was dedicated to discussion of differential dx, prognosis, treatment options, and coordination of care. She is not sure why pancreatic abdominal MRI was not done.  For now I will hold on placing a new order, I will wait for lab  results.     Return in about  3 months (around 07/12/2018) for HTN,lightheadedness.       Betty G. Martinique, MD  [facility] [place] office.

## 2018-04-11 NOTE — Assessment & Plan Note (Addendum)
She is still very concerned about this problem, given her family history of congestive cancer. Asymptomatic Last CA-19-9 was mildly elevated at 44.  History of cystic focus left ovary, last pelvic US in 02/2018: No significant changes.  She follows with gynecologist. CA 125 normal at 8 in 04/2017.  We will repeat CA 19-9 today and make recommendations accordingly.

## 2018-04-11 NOTE — Telephone Encounter (Signed)
Patient is currently in the office for appt.

## 2018-04-11 NOTE — Patient Instructions (Addendum)
A few things to remember from today's visit:   Palpitations - Plan: EKG 31-RXYV, Basic metabolic panel, TSH, CBC, Ambulatory referral to Cardiology  Abnormal EKG - Plan: Ambulatory referral to Cardiology  Intermittent lightheadedness - Plan: Basic metabolic panel, CBC  Adequate hydration. Continue monitoring blood pressure. For now continue same dose of amlodipine. Avoid caffeine intake.   Please be sure medication list is accurate. If a new problem present, please set up appointment sooner than planned today.

## 2018-04-12 ENCOUNTER — Encounter: Payer: Self-pay | Admitting: Family Medicine

## 2018-04-12 LAB — BASIC METABOLIC PANEL
BUN: 11 mg/dL (ref 7–25)
CALCIUM: 10.4 mg/dL (ref 8.6–10.4)
CO2: 24 mmol/L (ref 20–32)
CREATININE: 0.68 mg/dL (ref 0.50–0.99)
Chloride: 98 mmol/L (ref 98–110)
Glucose, Bld: 89 mg/dL (ref 65–99)
POTASSIUM: 3.7 mmol/L (ref 3.5–5.3)
SODIUM: 136 mmol/L (ref 135–146)

## 2018-04-12 LAB — CBC WITH DIFFERENTIAL/PLATELET
BASOS ABS: 31 {cells}/uL (ref 0–200)
Basophils Relative: 0.6 %
Eosinophils Absolute: 41 cells/uL (ref 15–500)
Eosinophils Relative: 0.8 %
HEMATOCRIT: 46.4 % — AB (ref 35.0–45.0)
Hemoglobin: 15.5 g/dL (ref 11.7–15.5)
LYMPHS ABS: 1142 {cells}/uL (ref 850–3900)
MCH: 30.6 pg (ref 27.0–33.0)
MCHC: 33.4 g/dL (ref 32.0–36.0)
MCV: 91.7 fL (ref 80.0–100.0)
MPV: 11.8 fL (ref 7.5–12.5)
Monocytes Relative: 9.8 %
NEUTROS PCT: 66.4 %
Neutro Abs: 3386 cells/uL (ref 1500–7800)
Platelets: 225 10*3/uL (ref 140–400)
RBC: 5.06 10*6/uL (ref 3.80–5.10)
RDW: 12.8 % (ref 11.0–15.0)
Total Lymphocyte: 22.4 %
WBC: 5.1 10*3/uL (ref 3.8–10.8)
WBCMIX: 500 {cells}/uL (ref 200–950)

## 2018-04-12 LAB — TSH: TSH: 1.22 mIU/L (ref 0.40–4.50)

## 2018-04-14 LAB — CANCER ANTIGEN 19-9: CA 19-9: 38 U/mL — ABNORMAL HIGH (ref <34)

## 2018-04-16 ENCOUNTER — Other Ambulatory Visit: Payer: Self-pay | Admitting: Family Medicine

## 2018-04-16 ENCOUNTER — Encounter: Payer: Self-pay | Admitting: Family Medicine

## 2018-04-16 ENCOUNTER — Encounter: Payer: Self-pay | Admitting: Gastroenterology

## 2018-04-16 DIAGNOSIS — R978 Other abnormal tumor markers: Secondary | ICD-10-CM | POA: Insufficient documentation

## 2018-04-16 DIAGNOSIS — K862 Cyst of pancreas: Secondary | ICD-10-CM

## 2018-05-08 ENCOUNTER — Encounter: Payer: Self-pay | Admitting: Cardiology

## 2018-05-08 ENCOUNTER — Ambulatory Visit (INDEPENDENT_AMBULATORY_CARE_PROVIDER_SITE_OTHER): Payer: No Typology Code available for payment source | Admitting: Cardiology

## 2018-05-08 VITALS — BP 142/88 | HR 72 | Ht 62.0 in | Wt 98.8 lb

## 2018-05-08 DIAGNOSIS — R002 Palpitations: Secondary | ICD-10-CM | POA: Diagnosis not present

## 2018-05-08 DIAGNOSIS — I10 Essential (primary) hypertension: Secondary | ICD-10-CM

## 2018-05-08 DIAGNOSIS — Z7189 Other specified counseling: Secondary | ICD-10-CM | POA: Diagnosis not present

## 2018-05-08 DIAGNOSIS — R079 Chest pain, unspecified: Secondary | ICD-10-CM

## 2018-05-08 NOTE — Progress Notes (Signed)
Cardiology Office Note:    Date:  05/08/2018   ID:  Monica Grant, DOB 07/15/1956, MRN [id number]  PCP:  Martinique, Betty G, MD  Cardiologist:  Buford Dresser, MD PhD  Referring MD: Martinique, Betty G, MD   CC: palpitations, chest pain  History of Present Illness:    Monica Grant is a 62 y.o. female with a hx of hypertension, hyperlipidemia who is seen as a new consult at the request of Martinique, Malka So, MD for the evaluation and management of palpitations.  Patient concerns: palpitations and chest pain. Chest pain is sharp, can come any time, can be related to palpitations. Related to movement. She walks every morning, has only felt one time when walking. Very brief, goes away on its own.   Had an episode when she was making the bed and had an episode of pain and palpitations, lasted longer than usual, called EMS. BP was elevated, ECG ok.   Takes blood pressures at home, usually runs 366Y systolic.  Had an episode of near syncope in 2017, went to [facility]. Has discharge instructions with her, but unfortunately we cannot connect in Care Everywhere. Noted to have echo, cardiac cath, VQ scan, reports all as normal. I cannot see results.  Tachycardia/palpitations/chest pain: -Initial onset: has been going on for years -Frequency: can be several times a week or go a week or more between -Duration: usually very brief, only one long episode -Triggers: no clear  -Aggravating/alleviating factors: no clear. -Syncope/near syncope: -Prior cardiac history: see above workup notes -Prior ECG:  -Prior workup: see above -Prior treatment: amlodipine for HTN -Possible medication interactions: -Caffeine: has cut back, one cup/day decaf -Alcohol: none -Tobacco: none -OTC supplements: vitamin D in the winter -Comorbidities:  -Exercise level: walked daily, [place]. -Labs: TSH , kidney function/electrolytes , CBC . -Cardiac ROS: no shortness of  breath, no PND, no orthopnea, no LE edema. -Family history: brother died of MI at age 25 (smoker, poor health). Sister died of COPD (smoker, drug use). Another sister had cancer and had heart issues, had a 3V CABG (tobacco, alcohol use). All with poor diets. Two other sisters in good health.  Past Medical History:  Diagnosis Date  . Hyperlipidemia   . Hypertension   . Kidney stones     Past Surgical History:  Procedure Laterality Date  . CARDIAC CATHETERIZATION      Current Medications: Current Outpatient Medications on File Prior to Visit  Medication Sig  . amLODipine (NORVASC) 2.5 MG tablet Take 1 tablet (2.5 mg total) by mouth daily.  Marland Kitchen atorvastatin (LIPITOR) 10 MG tablet Take 1 tablet (10 mg total) by mouth daily.  . Cholecalciferol (VITAMIN D3) 2000 units TABS Take 1 tablet by mouth daily.   No current facility-administered medications on file prior to visit.      Allergies:   Aspirin and Codeine   Social History   Socioeconomic History  . Marital status: Married    Spouse name: Not on file  . Number of children: 2  . Years of education: Not on file  . Highest education level: Not on file  Occupational History  . Not on file  Social Needs  . Financial resource strain: Not on file  . Food insecurity:    Worry: Not on file    Inability: Not on file  . Transportation needs:    Medical: Not on file    Non-medical: Not on file  Tobacco Use  . Smoking status:  Former Smoker    Types: Cigarettes    Last attempt to quit: 04/04/1987    Years since quitting: 31.1  . Smokeless tobacco: Never Used  Substance and Sexual Activity  . Alcohol use: No  . Drug use: No  . Sexual activity: Yes    Comment: 1st intercourse 47 yo-1 partner  Lifestyle  . Physical activity:    Days per week: Not on file    Minutes per session: Not on file  . Stress: Not on file  Relationships  . Social connections:    Talks on phone: Not on file    Gets together: Not on file    Attends  religious service: Not on file    Active member of club or organization: Not on file    Attends meetings of clubs or organizations: Not on file    Relationship status: Not on file  Other Topics Concern  . Not on file  Social History Narrative  . Not on file     Family History: The patient's family history includes COPD in her sister; Cancer in her mother and sister; Cancer (age of onset: 50) in her father; Heart disease in her paternal grandfather and sister; Heart disease (age of onset: 56) in her brother; Mental illness in her sister; Ovarian cancer in her mother. Mother living, age 8, has ovarian cancer and lymphoma, has HTN. Father passed age 27 from pancreatic cancer. Paternal Gma and Gpa had Mis, passed in their 83s and 76s, respectively. Brother had MI at age 47 and passed away.  ROS:   Please see the history of present illness.  Additional pertinent ROS: Review of Systems  Constitutional: Negative for chills, fever and weight loss.  HENT: Negative for ear pain and hearing loss.   Eyes: Negative for blurred vision and pain.  Respiratory: Negative for cough and shortness of breath.   Cardiovascular: Positive for chest pain and palpitations. Negative for orthopnea, claudication, leg swelling and PND.  Gastrointestinal: Negative for abdominal pain, blood in stool and melena.  Genitourinary: Negative for dysuria and hematuria.  Musculoskeletal: Negative for falls and myalgias.  Skin: Negative for rash.  Neurological: Negative for sensory change, focal weakness and loss of consciousness.  Endo/Heme/Allergies: Does not bruise/bleed easily.   EKGs/Labs/Other Studies Reviewed:    The following studies were reviewed today: Reports history of clean cardiac cath in 2017, do not have access to records.  EKG:  EKG is  ordered today.  The ekg ordered today demonstrates normal sinus rhythm, IVCD  Recent Labs: 01/14/2018: ALT 17 04/11/2018: BUN 11; Creat 0.68; Hemoglobin 15.5; Platelets  225; Potassium 3.7; Sodium 136; TSH 1.22  Recent Lipid Panel    Component Value Date/Time   CHOL 163 01/14/2018 0858   TRIG 42 01/14/2018 0858   HDL 86 01/14/2018 0858   CHOLHDL 1.9 01/14/2018 0858   LDLCALC 65 01/14/2018 0858    Physical Exam:    VS:  BP (!) 142/88 (BP Location: Left Arm)   Pulse 72   Ht 5\' 2"  (1.575 m)   Wt 98 lb 12.8 oz (44.8 kg)   BMI 18.07 kg/m     Wt Readings from Last 3 Encounters:  05/08/18 98 lb 12.8 oz (44.8 kg)  04/11/18 98 lb 4 oz (44.6 kg)  01/14/18 98 lb 4 oz (44.6 kg)     GEN: Well nourished, well developed in no acute distress HEENT: Normal NECK: No JVD; No carotid bruits LYMPHATICS: No lymphadenopathy CARDIAC: regular rhythm, normal S1 and S2,  no murmurs, rubs, gallops. Radial and DP pulses 2+ bilaterally. RESPIRATORY:  Clear to auscultation without rales, wheezing or rhonchi  ABDOMEN: Soft, non-tender, non-distended MUSCULOSKELETAL:  No edema; No deformity  SKIN: Warm and dry NEUROLOGIC:  Alert and oriented x 3 PSYCHIATRIC:  Normal affect   ASSESSMENT:    1. Palpitation   2. Chest pain, unspecified type   3. Essential hypertension   4. Counseling on health promotion and disease prevention    PLAN:    1. Palpitations, Chest pain: has been going on for several years, varying frequency. Had one event that required a call to EMS. Chest pain is largely atypical in nature. We discussed workup plan. Her preference would be to workup up the palpitations first with a 30 day event monitor, then if that is unrevealing, we will evaluate the chest pain. She would be a good candidate for exercise treadmill testing given her activity level and lack of other risk factors. -ordered 30 day event monitor  2. Hypertension: slightly elevated today. Recent visit with Dr. Martinique had BP 110/66. Will not adjust dosing at this time. Continue amlodipine.  3. Primary prevention The 10-year ASCVD risk score Mikey Bussing DC Jr., et al., 2013) is: 4.5%   Values used  to calculate the score:     Age: 49 years     Sex: Female     Is Non-Hispanic African American: No     Diabetic: No     Tobacco smoker: No     Systolic Blood Pressure: 412 mmHg     Is BP treated: Yes     HDL Cholesterol: 86 mg/dL     Total Cholesterol: 163 mg/dL Already on atorvastatin 10 mg. Appears to me that these levels were drawn on atorvastatin.  She has an excellent HDL level. Her LDL level is 65. Her ASCVD risk score does not account for her family history, but she appears to be responding well to atorvastatin. Prevention: -recommend heart healthy/Mediterranean diet, with whole grains, fruits, vegetable, fish, lean meats, nuts, and olive oil. Limit salt. -recommend moderate walking, 3-5 times/week for 30-50 minutes each session. Aim for at least 150 minutes.week. Goal should be pace of 3 miles/hours, or walking 1.5 miles in 30 minutes. She exceeds these targets already. -recommend avoidance of tobacco products. Avoid excess alcohol. She is already doing both of these things. -Additional risk factor control:  -Diabetes: A1c is not available. She is very low risk overall. Will defer to PCP.  -Lipids: as above  -Blood pressure control: as above  -Weight: underweight with a BMI of 18.  Plan for follow up: fu 6 mos or sooner PRN based on results of monitor and possible treadmill test.  Medication Adjustments/Labs and Tests Ordered: Current medicines are reviewed at length with the patient today.  Concerns regarding medicines are outlined above.  Orders Placed This Encounter  Procedures  . CARDIAC EVENT MONITOR  . EKG 12-Lead   No orders of the defined types were placed in this encounter.   Patient Instructions  Medication Instructions: Your physician recommends that you continue on your current medications as directed.    If you need a refill on your cardiac medications before your next appointment, please call your pharmacy.   Labwork: None  Procedures/Testing: Your  physician has recommended that you wear a 30 day event monitor. Event monitors are medical devices that record the heart's electrical activity. Doctors most often [place] these monitors to diagnose arrhythmias. Arrhythmias are problems with the speed or rhythm of  the heartbeat. The monitor is a small, portable device. You can wear one while you do your normal daily activities. This is usually used to diagnose what is causing palpitations/syncope (passing out). [place] [address]   Follow-Up: Your physician wants you to follow-up in 6 months with dr. Harrell Gave. You will receive a reminder letter in the mail two months in advance. If you don't receive a letter, please call our office at [phone] to schedule this follow-up appointment.   Special Instructions:    Thank you for choosing Heartcare at [facility]!!       Signed, Buford Dresser, MD PhD 05/08/2018 9:02 AM    Monica Grant

## 2018-05-08 NOTE — Patient Instructions (Signed)
Medication Instructions: Your physician recommends that you continue on your current medications as directed.    If you need a refill on your cardiac medications before your next appointment, please call your pharmacy.   Labwork: None  Procedures/Testing: Your physician has recommended that you wear a 30 day event monitor. Event monitors are medical devices that record the heart's electrical activity. Doctors most often [place] these monitors to diagnose arrhythmias. Arrhythmias are problems with the speed or rhythm of the heartbeat. The monitor is a small, portable device. You can wear one while you do your normal daily activities. This is usually used to diagnose what is causing palpitations/syncope (passing out). [place] [address]   Follow-Up: Your physician wants you to follow-up in 6 months with dr. Harrell Gave. You will receive a reminder letter in the mail two months in advance. If you don't receive a letter, please call our office at [phone] to schedule this follow-up appointment.   Special Instructions:    Thank you for choosing Heartcare at [facility]!!

## 2018-05-09 ENCOUNTER — Encounter: Payer: Self-pay | Admitting: Cardiology

## 2018-05-15 ENCOUNTER — Encounter: Payer: Self-pay | Admitting: Gynecology

## 2018-05-15 ENCOUNTER — Ambulatory Visit (INDEPENDENT_AMBULATORY_CARE_PROVIDER_SITE_OTHER): Payer: No Typology Code available for payment source | Admitting: Gynecology

## 2018-05-15 VITALS — BP 134/86 | Ht 63.0 in | Wt 99.0 lb

## 2018-05-15 DIAGNOSIS — Z01419 Encounter for gynecological examination (general) (routine) without abnormal findings: Secondary | ICD-10-CM

## 2018-05-15 DIAGNOSIS — N952 Postmenopausal atrophic vaginitis: Secondary | ICD-10-CM

## 2018-05-15 DIAGNOSIS — N83202 Unspecified ovarian cyst, left side: Secondary | ICD-10-CM | POA: Diagnosis not present

## 2018-05-15 DIAGNOSIS — N83201 Unspecified ovarian cyst, right side: Secondary | ICD-10-CM | POA: Diagnosis not present

## 2018-05-15 NOTE — Patient Instructions (Signed)
Call to schedule surgery  Call to Schedule your mammogram  Facilities in [place]: 1)  [facility]. [address] [place] Phone: [phone]  Dr. Isaiah Blakes at [facility] [address] Phone: [phone]     Mammogram A mammogram is an X-ray test to find changes in a woman's breast. You should get a mammogram if:  You are 62 years of age or older  You have risk factors.   Your doctor recommends that you have one.  BEFORE THE TEST  Do not schedule the test the week before your period, especially if your breasts are sore during this time.  On the day of your mammogram:  Wash your breasts and armpits well. After washing, do not put on any deodorant or talcum powder on until after your test.   Eat and drink as you usually do.   Take your medicines as usual.   If you are diabetic and take insulin, make sure you:   Eat before coming for your test.   Take your insulin as usual.   If you cannot keep your appointment, call before the appointment to cancel. Schedule another appointment.  TEST  You will need to undress from the waist up. You will put on a hospital gown.   Your breast will be put on the mammogram machine, and it will press firmly on your breast with a piece of plastic called a compression paddle. This will make your breast flatter so that the machine can X-ray all parts of your breast.   Both breasts will be X-rayed. Each breast will be X-rayed from above and from the side. An X-ray might need to be taken again if the picture is not good enough.   The mammogram will last about 15 to 30 minutes.  AFTER THE TEST Finding out the results of your test Ask when your test results will be ready. Make sure you get your test results.  Document Released: 11/02/2008 Document Revised: 07/26/2011 Document Reviewed: 11/02/2008 [facility] Patient Information 2012 [place].

## 2018-05-15 NOTE — Progress Notes (Signed)
Diamond Jentz [birth year] [id number]        62 y.o.  G2P2 for annual gynecologic exam.  With several issues noted below.  Past medical history,surgical history, problem list, medications, allergies, family history and social history were all reviewed and documented as reviewed in the EPIC chart.  ROS:  Performed with pertinent positives and negatives included in the history, assessment and plan.   Additional significant findings : None   Exam: [name] assistant Vitals:   05/15/18 1106  BP: 134/86  Weight: 99 lb (44.9 kg)  Height: '5\' 3"'  (1.6 m)   Body mass index is 17.54 kg/m.  General appearance:  Normal affect, orientation and appearance. Skin: Grossly normal HEENT: Without gross lesions.  No cervical or supraclavicular adenopathy. Thyroid normal.  Lungs:  Clear without wheezing, rales or rhonchi Cardiac: RR, without RMG Abdominal:  Soft, nontender, without masses, guarding, rebound, organomegaly or hernia Breasts:  Examined lying and sitting without masses, retractions, discharge or axillary adenopathy. Pelvic:  Ext, BUS, Vagina: With atrophic changes  Cervix: With atrophic changes  Uterus: Anteverted, normal size, shape and contour, midline and mobile nontender   Adnexa: Without masses or tenderness    Anus and perineum: Normal   Rectovaginal: Normal sphincter tone without palpated masses or tenderness.    Assessment/Plan:  62 y.o. G2P2 female for annual gynecologic exam.   1. History of bilateral ovarian cysts.  Was seen as a new patient last year with a history of bilateral ovarian cysts ultrasound elsewhere showed endometrial echo 2 mm.  Right ovary with 1.7 x 1.1 x 1.4 simple cyst in left ovary with 4.2 x 2.6 x 4.1 cyst with small echogenic focus 4 mm.  CA 125 was 8.  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  We discussed bilateral salpingo-oophorectomies and the patient at that point did not want to  proceed with these.  We discussed risks versus benefits and she understands by expectant management no guarantee as far as pathology and that the ovarian cysts could represent ovarian carcinoma.  Had a follow-up ultrasound 02/2018 with the measurements reported at 5.1 x 3 cm.  Again the discussion of expectant versus surgery with no guarantee of his far as pathology.  The patient elected for expectant and has an ultrasound follow-up scheduled in January.  We will check baseline CA 125 today.  We again discussed proceeding with laparoscopic BSO for definitive diagnoses and treatment.  She understands by expectant management she accepts the risk that this could represent ovarian malignancy.  She did have genetic testing to include BRCA 1 and 2 as well as an expanded panel which were all negative fortunately.  Patient will follow-up for her CA 125 results drawn today and she will decide whether she wants to proceed with laparoscopic BSO now or wait the January ultrasound. 2. Mammography 2017.  I again strongly recommended patient schedule a screening mammogram.  Most common cancer in women reviewed.  Names and numbers provided.  Patient agrees to call and schedule.  Breast exam normal today. 3. Colonoscopy 2017.  Repeat at their recommended interval. 4. Pap smear 06/2016.  No Pap smear done today.  No history of abnormal Pap smears previously.  Plan repeat Pap smear next year at 3-year interval per current screening guidelines. 5. DEXA reported 4 to 5 years ago.  Recommend scheduling DEXA now given her thin status Caucasian.  Patient agrees to schedule and will follow-up for this. 6.  Health maintenance.  No routine lab work done as patient does this elsewhere.  Follow-up for surgery or ultrasound as she decides otherwise follow-up in 1 year for annual exam.   Anastasio Auerbach MD, 11:39 AM 05/15/2018

## 2018-05-16 ENCOUNTER — Ambulatory Visit (INDEPENDENT_AMBULATORY_CARE_PROVIDER_SITE_OTHER): Payer: No Typology Code available for payment source

## 2018-05-16 ENCOUNTER — Encounter: Payer: Self-pay | Admitting: Gynecology

## 2018-05-16 DIAGNOSIS — R002 Palpitations: Secondary | ICD-10-CM | POA: Diagnosis not present

## 2018-05-16 LAB — CA 125: CA 125: 8 U/mL (ref <35)

## 2018-05-21 ENCOUNTER — Telehealth: Payer: Self-pay | Admitting: *Deleted

## 2018-05-21 NOTE — Telephone Encounter (Signed)
Patient called asked if order for mammogram & dexa can be faxed to [facility], this was done patient will call to schedule.

## 2018-05-22 ENCOUNTER — Encounter: Payer: Self-pay | Admitting: Family Medicine

## 2018-06-03 ENCOUNTER — Ambulatory Visit (INDEPENDENT_AMBULATORY_CARE_PROVIDER_SITE_OTHER): Payer: No Typology Code available for payment source | Admitting: Gastroenterology

## 2018-06-03 ENCOUNTER — Encounter: Payer: Self-pay | Admitting: Gastroenterology

## 2018-06-03 DIAGNOSIS — Z86010 Personal history of colonic polyps: Secondary | ICD-10-CM | POA: Diagnosis not present

## 2018-06-03 DIAGNOSIS — C259 Malignant neoplasm of pancreas, unspecified: Secondary | ICD-10-CM

## 2018-06-03 NOTE — Progress Notes (Signed)
[facility] Consult Note:  History: Dia Donate 06/03/2018  Referring physician: Martinique, Betty G, MD  Reason for consult/chief complaint: pancreatic cyst (elevated ca19-9 and father had pancreatic cancer )   Subjective  HPI:  This is a very pleasant 62 year old woman referred by primary care for evaluation of a previously discovered pancreatic cystic lesion and elevated CA-19-9.  Adrean's father had pancreatic cancer, so a previous primary care provider in [place] did a CA-19-9 that was mildly elevated.  A follow-up MRI of abdomen/MRCP was done, with results noted below.  She had seen a GI doctor in [place] at that time, but it was really her primary care provider who did all this work-up.  The GI physician did a colonoscopy as noted below.  Zari feels well, with good appetite, no dysphagia, nausea vomiting or weight loss.  Her bowel habits are regular without rectal bleeding.  She has occasional bloating that she attributes to certain foods.  She also tells me that an ovarian cyst was discovered that is being monitored.  ROS:  Review of Systems  Constitutional: Negative for appetite change and unexpected weight change.  HENT: Negative for mouth sores and voice change.   Eyes: Negative for pain and redness.  Respiratory: Negative for cough and shortness of breath.   Cardiovascular: Positive for palpitations. Negative for chest pain.  Genitourinary: Negative for dysuria and hematuria.  Musculoskeletal: Negative for arthralgias and myalgias.  Skin: Negative for pallor and rash.  Neurological: Negative for weakness and headaches.  Hematological: Negative for adenopathy.     Past Medical History: Past Medical History:  Diagnosis Date  . Elevated CA 19-9 level   . Hyperlipidemia   . Hypertension   . Kidney stones   . Pancreatic cyst      Past Surgical History: Past Surgical History:  Procedure Laterality Date  . CARDIAC CATHETERIZATION        Family History: Family History  Problem Relation Age of Onset  . Pancreatic cancer Father 18       pancreatic  . Cancer Sister        ? type  . COPD Sister   . Heart disease Sister   . Mental illness Sister   . Heart disease Brother 38       MI  . Heart disease Paternal Grandfather   . Ovarian cancer Mother   . Lymphoma Mother   . Heart disease Sister   . Stomach cancer Neg Hx   . Throat cancer Neg Hx     Social History: Social History   Socioeconomic History  . Marital status: Married    Spouse name: Not on file  . Number of children: 2  . Years of education: Not on file  . Highest education level: Not on file  Occupational History  . Not on file  Social Needs  . Financial resource strain: Not on file  . Food insecurity:    Worry: Not on file    Inability: Not on file  . Transportation needs:    Medical: Not on file    Non-medical: Not on file  Tobacco Use  . Smoking status: Former Smoker    Types: Cigarettes    Last attempt to quit: 04/04/1987    Years since quitting: 31.1  . Smokeless tobacco: Never Used  Substance and Sexual Activity  . Alcohol use: No  . Drug use: No  . Sexual activity: Yes    Partners: Male    Comment: 1st intercourse 87 yo-1  partner  Lifestyle  . Physical activity:    Days per week: Not on file    Minutes per session: Not on file  . Stress: Not on file  Relationships  . Social connections:    Talks on phone: Not on file    Gets together: Not on file    Attends religious service: Not on file    Active member of club or organization: Not on file    Attends meetings of clubs or organizations: Not on file    Relationship status: Not on file  Other Topics Concern  . Not on file  Social History Narrative  . Not on file    Allergies: Allergies  Allergen Reactions  . Aspirin Other (See Comments)    GI upset  . Codeine Other (See Comments)    GI upset    Outpatient Meds: Current Outpatient Medications  Medication  Sig Dispense Refill  . amLODipine (NORVASC) 2.5 MG tablet Take 1 tablet (2.5 mg total) by mouth daily. 90 tablet 0  . atorvastatin (LIPITOR) 10 MG tablet Take 1 tablet (10 mg total) by mouth daily. 90 tablet 0  . Cholecalciferol (VITAMIN D3) 2000 units TABS Take 1 tablet by mouth daily.     No current facility-administered medications for this visit.       ___________________________________________________________________ Objective   Exam:  There were no vitals taken for this visit.   General: this is a(n) well-appearing woman  Eyes: sclera anicteric, no redness  ENT: oral mucosa moist without lesions, no cervical or supraclavicular lymphadenopathy, good dentition  CV: RRR without murmur, S1/S2, no JVD, no peripheral edema.  She has a cardiac loop recorder the upper chest wall  Resp: clear to auscultation bilaterally, normal RR and effort noted  GI: soft, no tenderness, with active bowel sounds. No guarding or palpable organomegaly noted.  Skin; warm and dry, no rash or jaundice noted  Neuro: awake, alert and oriented x 3. Normal gross motor function and fluent speech  Labs:  Labs 11/08/2015: Normal LFTs CA-19-9 = 41 (35 listed as ULN for that lab)  CA 19-9 = 44 May 2019 38 in Aug 2019  Radiologic Studies:  Report from MRI abdomen/MRCP dated 12/02/2015 at [facility]ology in [place]:  No gallstones.  No intra-or extrahepatic biliary dilatation.  CBD 2 mm without filling defects. Pancreatic duct was not dilated (55mm).  No evidence of pancreatic divisum. 4 mm cystic structure in pancreatic body with connection to main pancreatic duct, consistent with sidebranch IPMN   Colonoscopy report by Dr. Bea Graff [place] needed 01/27/2016, indication screening Normal terminal ileum Sigmoid diverticulosis 6 mm cecal polyp removed with forceps. 6 mm rectal polyp removed with forceps. All internal hemorrhoids reported. Recommendation was for repeat colonoscopy in  5 years, no pathology included with these records. Assessment: Encounter Diagnoses  Name Primary?  . 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 Personal history of colonic polyps     She had a diminutive, benign-appearing lesion that was believed to be a branch duct IPMN found to have years ago.  CA-19-9 has very little over the last couple years, remains mildly elevated.  We discussed the need for periodic radiographic evaluation based on current ([place]) guidelines.  We also discussed the need for future surveillance and the possibility of further work-up such as EUS depending on MRI appearance of this lesion.  I asked her to obtain the colon polyp pathology report from her 2017 exam, since it is not clear why  the five-year recommendation was made in the colonoscopy report before pathology was certain.  When that happens, I can give her a more confident recommendation on colonoscopy interval.  Plan:  MRI/MRCP when she no longer has the loop recorder.  Initial appointment for that was scheduled, insurance authorization process underway.  Thank you for the courtesy of this consult.  Please call me with any questions or concerns.  Nelida Meuse III  CC: Martinique, Betty G, MD

## 2018-06-03 NOTE — Patient Instructions (Addendum)
If you are age 62 or older, your body mass index should be between 23-30. Your There is no height or weight on file to calculate BMI. If this is out of the aforementioned range listed, please consider follow up with your Primary Care Provider.  If you are age 51 or younger, your body mass index should be between 19-25. Your There is no height or weight on file to calculate BMI. If this is out of the aformentioned range listed, please consider follow up with your Primary Care Provider.   You have been scheduled for an MRI/MRCP at [facility] on 06-09-2018. Your appointment time is 8am. Please arrive 15 minutes prior to your appointment time for registration purposes. Please make certain not to have anything to eat or drink 6 hours prior to your test. In addition, if you have any metal in your body, have a pacemaker or defibrillator, please be sure to let your ordering physician know. This test typically takes 45 minutes to 1 hour to complete. Should you need to reschedule, please call [phone] to do so.  It was a pleasure to see you today!  Dr. Loletha Carrow

## 2018-06-04 ENCOUNTER — Telehealth: Payer: Self-pay | Admitting: Gastroenterology

## 2018-06-04 NOTE — Telephone Encounter (Signed)
Spoke with pt who states she seemed to have called the wrong office. She report she is trying to get in touch with someone who can help her find an imaging center under her insurance plan that can do the Abd MRI. Will route to Dr. Corena Pilgrim nurse.

## 2018-06-04 NOTE — Telephone Encounter (Signed)
Please contact her - it seems she has a question or concern regarding her MRI.

## 2018-06-04 NOTE — Telephone Encounter (Signed)
Talked to Marcie Bal and our pre-cert dept rep Amy. Monica Grant's insurance is covered for [facility] where her MRCP is scheduled. Amy will precert the week before.

## 2018-06-04 NOTE — Telephone Encounter (Signed)
Follow up:   Patient calling back she has some question about some test.

## 2018-06-09 ENCOUNTER — Ambulatory Visit (HOSPITAL_COMMUNITY): Payer: No Typology Code available for payment source

## 2018-06-17 ENCOUNTER — Encounter: Payer: Self-pay | Admitting: Gynecology

## 2018-06-19 ENCOUNTER — Other Ambulatory Visit: Payer: Self-pay | Admitting: Gastroenterology

## 2018-06-19 ENCOUNTER — Encounter: Payer: Self-pay | Admitting: Gynecology

## 2018-06-19 ENCOUNTER — Ambulatory Visit (HOSPITAL_COMMUNITY)
Admission: RE | Admit: 2018-06-19 | Discharge: 2018-06-19 | Disposition: A | Discharge status: Home / Self Care | Payer: No Typology Code available for payment source | Source: Ambulatory Visit | Attending: Gastroenterology | Admitting: Gastroenterology

## 2018-06-19 ENCOUNTER — Ambulatory Visit
Admission: RE | Admit: 2018-06-19 | Discharge: 2018-06-19 | Disposition: A | Discharge status: Home / Self Care | Payer: Self-pay | Source: Ambulatory Visit | Attending: Gastroenterology | Admitting: Gastroenterology

## 2018-06-19 DIAGNOSIS — Z86010 Personal history of colonic polyps: Secondary | ICD-10-CM | POA: Insufficient documentation

## 2018-06-19 DIAGNOSIS — C259 Malignant neoplasm of pancreas, unspecified: Secondary | ICD-10-CM | POA: Diagnosis not present

## 2018-06-19 DIAGNOSIS — C801 Malignant (primary) neoplasm, unspecified: Secondary | ICD-10-CM

## 2018-06-19 LAB — POCT I-STAT CREATININE: CREATININE: 0.6 mg/dL (ref 0.44–1.00)

## 2018-06-19 MED ORDER — GADOBUTROL 1 MMOL/ML IV SOLN
4.0000 mL | Freq: Once | INTRAVENOUS | Status: AC | PRN
  Administered 2018-06-19: 4 mL via INTRAVENOUS

## 2018-07-08 ENCOUNTER — Other Ambulatory Visit: Payer: Self-pay | Admitting: Family Medicine

## 2018-07-08 DIAGNOSIS — I10 Essential (primary) hypertension: Secondary | ICD-10-CM

## 2018-07-08 DIAGNOSIS — E785 Hyperlipidemia, unspecified: Secondary | ICD-10-CM

## 2018-07-08 MED ORDER — AMLODIPINE BESYLATE 2.5 MG PO TABS: 2.5000 mg | ORAL_TABLET | Freq: Every day | ORAL | 0 refills | Status: DC

## 2018-07-08 MED ORDER — ATORVASTATIN CALCIUM 10 MG PO TABS: 10.0000 mg | ORAL_TABLET | Freq: Every day | ORAL | 0 refills | Status: DC

## 2018-07-14 ENCOUNTER — Encounter: Payer: Self-pay | Admitting: Family Medicine

## 2018-07-14 ENCOUNTER — Ambulatory Visit: Payer: No Typology Code available for payment source | Admitting: Family Medicine

## 2018-07-14 VITALS — BP 126/80 | HR 85 | Temp 97.8°F | Resp 12 | Ht 63.0 in | Wt 101.5 lb

## 2018-07-14 DIAGNOSIS — M810 Age-related osteoporosis without current pathological fracture: Secondary | ICD-10-CM

## 2018-07-14 DIAGNOSIS — R002 Palpitations: Secondary | ICD-10-CM | POA: Diagnosis not present

## 2018-07-14 DIAGNOSIS — I10 Essential (primary) hypertension: Secondary | ICD-10-CM | POA: Diagnosis not present

## 2018-07-14 NOTE — Patient Instructions (Addendum)
A few things to remember from today's visit:   Hypertension, essential, benign  Palpitations  Osteoporosis, unspecified osteoporosis type, unspecified pathological fracture presence  I will see you back in 04/2019.  Keep appt with cardiologist in 09/2018.   Osteoporosis Osteoporosis happens when your bones become thinner and weaker. Weak bones can break (fracture) more easily when you slip or fall. Bones most at risk of breaking are in the hip, wrist, and spine. Follow these instructions at home:  Get enough calcium and vitamin D. These nutrients are good for your bones.  Exercise as told by your doctor.  Do not use any tobacco products. This includes cigarettes, chewing tobacco, and electronic cigarettes. If you need help quitting, ask your doctor.  Limit the amount of alcohol you drink.  Take medicines only as told by your doctor.  Keep all follow-up visits as told by your doctor. This is important.  Take care at home to prevent falls. Some ways to do this are: ? Keep rooms well lit and tidy. ? Put safety rails on your stairs. ? Put a rubber mat in the bathroom and other places that are often wet or slippery. Get help right away if:  You fall.  You hurt yourself. This information is not intended to replace advice given to you by your health care provider. Make sure you discuss any questions you have with your health care provider. Document Released: 10/29/2011 Document Revised: 01/12/2016 Document Reviewed: 01/14/2014 Elsevier Interactive Patient Education  2018 [employer].  Please be sure medication list is accurate. If a new problem present, please set up appointment sooner than planned today.

## 2018-07-14 NOTE — Progress Notes (Signed)
HPI:   Monica Grant is a 62 y.o. female, who is here today for 3 months follow up.   She was last seen on 04/11/18  Since her last OV she has seen GI and cardiologist. Last OV she was c/o palpitations and lightheadedness.  GI recommended 2 years follow up with abdominal imaging to follow on pancreatic cyst.  Palpitations, improved after stopping caffeine intake.  She is following with cardiologist. According to pt,she did wear a 30 days heart monitor and no serious arrhythmia was found. She had a few episodes while she was wearing monitor. Occasional "little" "flutter" sensation. She has not identified exacerbating factors.  Chest pain has resolved.  Hypertension:  Currently on Amlodipine 2.5 mg daily.  Home BP 110-120/70's.  She is taking medications as instructed, no side effects reported. She has not noted unusual headache, visual changes, exertional chest pain, dyspnea,  focal weakness, or edema.   Lab Results  Component Value Date   CREATININE 0.60 06/19/2018   BUN 11 04/11/2018   NA 136 04/11/2018   K 3.7 04/11/2018   CL 98 04/11/2018   CO2 24 04/11/2018      She is also reporting recent DEXA, showed osteoporosis multiple sites,06/17/18.  She is planning on meeting with her gyn to discussed treatment options. She is concerned about possible side effects. She wonders if she can take Ca++ and vit D + increasing physical activity instead taking medication.    Review of Systems  Constitutional: Negative for activity change, appetite change, fatigue, fever and unexpected weight change.  HENT: Negative for mouth sores, nosebleeds and trouble swallowing.   Eyes: Negative for redness and visual disturbance.  Respiratory: Negative for cough, shortness of breath and wheezing.   Cardiovascular: Positive for palpitations. Negative for chest pain and leg swelling.  Gastrointestinal: Negative for abdominal pain, nausea and vomiting.       Negative for  changes in bowel habits.  Genitourinary: Negative for decreased urine volume, difficulty urinating, dysuria and hematuria.  Neurological: Negative for syncope, weakness and headaches.  Psychiatric/Behavioral: Negative for confusion. The patient is nervous/anxious.     Current Outpatient Medications on File Prior to Visit  Medication Sig Dispense Refill  . amLODipine (NORVASC) 2.5 MG tablet Take 1 tablet (2.5 mg total) by mouth daily. 90 tablet 0  . atorvastatin (LIPITOR) 10 MG tablet Take 1 tablet (10 mg total) by mouth daily. 90 tablet 0  . Cholecalciferol (VITAMIN D3) 2000 units TABS Take 1 tablet by mouth daily.     No current facility-administered medications on file prior to visit.      Past Medical History:  Diagnosis Date  . Elevated CA 19-9 level   . Hyperlipidemia   . Hypertension   . Kidney stones   . Pancreatic cyst    Allergies  Allergen Reactions  . Aspirin Other (See Comments)    GI upset  . Codeine Other (See Comments)    GI upset    Social History   Socioeconomic History  . Marital status: Married    Spouse name: Not on file  . Number of children: 2  . Years of education: Not on file  . Highest education level: Not on file  Occupational History  . Not on file  Social Needs  . Financial resource strain: Not on file  . Food insecurity:    Worry: Not on file    Inability: Not on file  . Transportation needs:    Medical: Not on  file    Non-medical: Not on file  Tobacco Use  . Smoking status: Former Smoker    Types: Cigarettes    Last attempt to quit: 04/04/1987    Years since quitting: 31.3  . Smokeless tobacco: Never Used  Substance and Sexual Activity  . Alcohol use: No  . Drug use: No  . Sexual activity: Yes    Partners: Male    Comment: 1st intercourse 53 yo-1 partner  Lifestyle  . Physical activity:    Days per week: Not on file    Minutes per session: Not on file  . Stress: Not on file  Relationships  . Social connections:    Talks  on phone: Not on file    Gets together: Not on file    Attends religious service: Not on file    Active member of club or organization: Not on file    Attends meetings of clubs or organizations: Not on file    Relationship status: Not on file  Other Topics Concern  . Not on file  Social History Narrative  . Not on file    Vitals:   07/14/18 0849  BP: 126/80  Pulse: 85  Resp: 12  Temp: 97.8 F (36.6 C)  SpO2: 99%   Body mass index is 17.98 kg/m.  Physical Exam  Nursing note and vitals reviewed. Constitutional: She is oriented to person, place, and time. She appears well-developed. No distress.  HENT:  Head: Normocephalic and atraumatic.  Mouth/Throat: Oropharynx is clear and moist and mucous membranes are normal.  Eyes: Pupils are equal, round, and reactive to light. Conjunctivae are normal.  Cardiovascular: Normal rate and regular rhythm.  No murmur heard. Pulses:      Dorsalis pedis pulses are 2+ on the right side, and 2+ on the left side.  Respiratory: Effort normal and breath sounds normal. No respiratory distress.  GI: Soft. She exhibits no mass. There is no hepatomegaly. There is no tenderness.  Musculoskeletal: She exhibits no edema.  Lymphadenopathy:    She has no cervical adenopathy.  Neurological: She is alert and oriented to person, place, and time. She has normal strength. No cranial nerve deficit. Gait normal.  Skin: Skin is warm. No rash noted. No erythema.  Psychiatric: She has a normal mood and affect.  Well groomed, good eye contact.      ASSESSMENT AND PLAN:   [name] was seen today for 3 months follow-up.  Diagnoses and all orders for this visit:  Hypertension, essential, benign Adequately controlled. No changes in current management. Continue low salt diet. Eye exam recommended annually.  Palpitations Improved after decreasing caffeine intake. Heart monitor negative for serious arrhythmia. She has appt with cardiologist, Dr  Harrell Gave ,early next year. Instructed about warning signs.   Osteoporosis, unspecified osteoporosis type, unspecified pathological fracture presence Educated about Dx,prognosistic and treatment options. We reviewed recommended Ca++ and vit D intake,explained that supplementation does not treat osteoporosis. Regular physical activity and weightbearing exercises 2-3 times per week may help. Fall prevention. Some side effects of fosamax discussed.     Tashari Schoenfelder G. Martinique, MD  [facility]. [place] office.

## 2018-07-18 ENCOUNTER — Encounter: Payer: Self-pay | Admitting: Family Medicine

## 2018-10-05 ENCOUNTER — Other Ambulatory Visit: Payer: Self-pay | Admitting: Family Medicine

## 2018-10-05 DIAGNOSIS — E785 Hyperlipidemia, unspecified: Secondary | ICD-10-CM

## 2018-10-05 DIAGNOSIS — I10 Essential (primary) hypertension: Secondary | ICD-10-CM

## 2018-10-06 MED ORDER — ATORVASTATIN CALCIUM 10 MG PO TABS: 10.0000 mg | ORAL_TABLET | Freq: Every day | ORAL | 0 refills | Status: DC

## 2018-10-06 MED ORDER — AMLODIPINE BESYLATE 2.5 MG PO TABS: 2.5000 mg | ORAL_TABLET | Freq: Every day | ORAL | 0 refills | Status: DC

## 2018-11-07 ENCOUNTER — Ambulatory Visit: Payer: No Typology Code available for payment source | Admitting: Cardiology

## 2019-01-05 ENCOUNTER — Other Ambulatory Visit: Payer: Self-pay | Admitting: Family Medicine

## 2019-01-05 DIAGNOSIS — I10 Essential (primary) hypertension: Secondary | ICD-10-CM

## 2019-01-05 DIAGNOSIS — E785 Hyperlipidemia, unspecified: Secondary | ICD-10-CM

## 2019-01-05 MED ORDER — ATORVASTATIN CALCIUM 10 MG PO TABS: 10.0000 mg | ORAL_TABLET | Freq: Every day | ORAL | 0 refills | Status: DC

## 2019-01-05 MED ORDER — AMLODIPINE BESYLATE 2.5 MG PO TABS: 2.5000 mg | ORAL_TABLET | Freq: Every day | ORAL | 0 refills | Status: DC

## 2019-01-19 ENCOUNTER — Telehealth: Payer: Self-pay | Admitting: Cardiology

## 2019-01-19 NOTE — Telephone Encounter (Signed)
Mychart, smartphone, consent (verbal), pre reg complete 01/19/19 AF

## 2019-01-21 ENCOUNTER — Telehealth (INDEPENDENT_AMBULATORY_CARE_PROVIDER_SITE_OTHER): Payer: No Typology Code available for payment source | Admitting: Cardiology

## 2019-01-21 ENCOUNTER — Encounter: Payer: Self-pay | Admitting: Cardiology

## 2019-01-21 VITALS — BP 122/70 | HR 76 | Ht 62.5 in | Wt 99.0 lb

## 2019-01-21 DIAGNOSIS — I10 Essential (primary) hypertension: Secondary | ICD-10-CM

## 2019-01-21 DIAGNOSIS — Z7189 Other specified counseling: Secondary | ICD-10-CM | POA: Diagnosis not present

## 2019-01-21 DIAGNOSIS — Z8249 Family history of ischemic heart disease and other diseases of the circulatory system: Secondary | ICD-10-CM

## 2019-01-21 DIAGNOSIS — R002 Palpitations: Secondary | ICD-10-CM | POA: Diagnosis not present

## 2019-01-21 NOTE — Patient Instructions (Signed)

## 2019-01-21 NOTE — Progress Notes (Signed)
Virtual Visit via Video Note   This visit type was conducted due to national recommendations for restrictions regarding the COVID-19 Pandemic (e.g. social distancing) in an effort to limit this patient's exposure and mitigate transmission in our community.  Due to her co-morbid illnesses, this patient is at least at moderate risk for complications without adequate follow up.  This format is felt to be most appropriate for this patient at this time.  All issues noted in this document were discussed and addressed.  A limited physical exam was performed with this format.  Please refer to the patient's chart for her consent to telehealth for [facility].   Date:  01/21/2019   ID:  Monica Grant, DOB 1956/02/14, MRN [id number]  Patient Location: Home Provider Location: Home  PCP:  Martinique, Betty G, MD  Cardiologist:  Buford Dresser, MD  Electrophysiologist:  None   Evaluation Performed:  Follow-Up Visit  Chief Complaint:  Follow up  History of Present Illness:    Monica Grant is a 63 y.o. female with a hx of hypertension, hyperlipidemia who was initially seen in consult on 05/08/18  The patient does not have symptoms concerning for COVID-19 infection (fever, chills, cough, or new shortness of breath).   Updates today: She has stopped caffeine, feels much better. Reviewed monitor results again from 04/2018, which notes that her primary rhythm is sinus rhythm. She had rare PAC/PVC and one very brief episode of SVT (8 beats at 119 bpm). She had 140 triggered events without any significant arrhythmia.  Blood pressure well controlled, staying active.  Chest pain resolved.   Denies shortness of breath at rest or with normal exertion. No PND, orthopnea, LE edema or unexpected weight gain. No syncope.   Past Medical History:  Diagnosis Date  . Elevated CA 19-9 level   . Hyperlipidemia   . Hypertension   . Kidney stones   . Pancreatic cyst    Past Surgical History:  Procedure  Laterality Date  . CARDIAC CATHETERIZATION       Current Meds  Medication Sig  . amLODipine (NORVASC) 2.5 MG tablet Take 1 tablet (2.5 mg total) by mouth daily.  Marland Kitchen atorvastatin (LIPITOR) 10 MG tablet Take 1 tablet (10 mg total) by mouth daily.  . Cholecalciferol (VITAMIN D3) 2000 units TABS Take 1 tablet by mouth daily.     Allergies:   Aspirin and Codeine   Social History   Tobacco Use  . Smoking status: Former Smoker    Types: Cigarettes    Last attempt to quit: 04/04/1987    Years since quitting: 31.8  . Smokeless tobacco: Never Used  Substance Use Topics  . Alcohol use: No  . Drug use: No     Family Hx: The patient's family history includes COPD in her sister; Cancer in her sister; Heart disease in her paternal grandfather, sister, and sister; Heart disease (age of onset: 39) in her brother; Lymphoma in her mother; Mental illness in her sister; Ovarian cancer in her mother; Pancreatic cancer (age of onset: 31) in her father. There is no history of Stomach cancer or Throat cancer.  ROS:   Please see the history of present illness.    Constitutional: Negative for chills, fever, night sweats, unintentional weight loss  HENT: Negative for ear pain and hearing loss.   Eyes: Negative for loss of vision and eye pain.  Respiratory: Negative for cough, sputum, wheezing.   Cardiovascular: See HPI. Gastrointestinal: Negative for abdominal pain, melena, and hematochezia.  Genitourinary:  Negative for dysuria and hematuria.  Musculoskeletal: Negative for falls and myalgias.  Skin: Negative for itching and rash.  Neurological: Negative for focal weakness, focal sensory changes and loss of consciousness.  Endo/Heme/Allergies: Does not bruise/bleed easily.  All other systems reviewed and are negative.   Prior CV studies:   The following studies were reviewed today:  Monitor from 04/2018  Labs/Other Tests and Data Reviewed:    EKG:  An ECG dated 05/08/18 was personally reviewed  today and demonstrated:  NSR, IVCD  Recent Labs: 04/11/2018: BUN 11; Hemoglobin 15.5; Platelets 225; Potassium 3.7; Sodium 136; TSH 1.22 06/19/2018: Creatinine, Ser 0.60   Recent Lipid Panel Lab Results  Component Value Date/Time   CHOL 163 01/14/2018 08:58 AM   TRIG 42 01/14/2018 08:58 AM   HDL 86 01/14/2018 08:58 AM   CHOLHDL 1.9 01/14/2018 08:58 AM   LDLCALC 65 01/14/2018 08:58 AM    Wt Readings from Last 3 Encounters:  01/21/19 99 lb (44.9 kg)  07/14/18 101 lb 8 oz (46 kg)  05/15/18 99 lb (44.9 kg)     Objective:    Vital Signs:  BP 122/70   Pulse 76   Ht 5' 2.5" (1.588 m)   Wt 99 lb (44.9 kg)   BMI 17.82 kg/m    VITAL SIGNS:  reviewed GEN:  no acute distress EYES:  sclerae anicteric, EOMI - Extraocular Movements Intact RESPIRATORY:  normal respiratory effort, symmetric expansion CARDIOVASCULAR:  no peripheral edema SKIN:  no rash, lesions or ulcers. MUSCULOSKELETAL:  no obvious deformities. NEURO:  alert and oriented x 3, no obvious focal deficit PSYCH:  normal affect  ASSESSMENT & PLAN:    Palpitations, chest pain: symptoms improved since stopping caffeine. Reviewed monitor results with her.  -no further workup at this time, but if symptoms recur, would consider exercise treadmill test  Hypertension: well controlled today -continue amlodipine  Primary prevention, with strong family history of heart disease: on atorvastatin 10 mg, already has an active lifestyle, reviewed recommendations for diet and risk factor control.  COVID-19 Education: The signs and symptoms of COVID-19 were discussed with the patient and how to seek care for testing (follow up with PCP or arrange E-visit).  The importance of social distancing was discussed today.  Time:   Today, I have spent 15 minutes with the patient with telehealth technology discussing the above problems.    Patient Instructions  Medication Instructions:  Your Physician recommend you continue on your current  medication as directed.    If you need a refill on your cardiac medications before your next appointment, please call your pharmacy.   Lab work: None  Testing/Procedures: None  Follow-Up: At [facility], you and your health needs are our priority.  As part of our continuing mission to provide you with exceptional heart care, we have created designated Provider Care Teams.  These Care Teams include your primary Cardiologist (physician) and Advanced Practice Providers (APPs -  Physician Assistants and Nurse Practitioners) who all work together to provide you with the care you need, when you need it. You will need a follow up appointment in 1 years.  Please call our office 2 months in advance to schedule this appointment.  You may see Buford Dresser, MD or one of the following Advanced Practice Providers on your designated Care Team:   Rosaria Ferries, PA-C . Jory Sims, DNP, ANP      Medication Adjustments/Labs and Tests Ordered: Current medicines are reviewed at length with the patient today.  Concerns regarding medicines are outlined above.   Tests Ordered: No orders of the defined types were placed in this encounter.   Medication Changes: No orders of the defined types were placed in this encounter.  Disposition:  Follow up 1 year or sooner PRN  Signed, Buford Dresser, MD  01/21/2019  [facility]

## 2019-01-28 ENCOUNTER — Encounter: Payer: Self-pay | Admitting: Cardiology

## 2019-01-29 ENCOUNTER — Ambulatory Visit (INDEPENDENT_AMBULATORY_CARE_PROVIDER_SITE_OTHER): Payer: No Typology Code available for payment source | Admitting: Family Medicine

## 2019-01-29 ENCOUNTER — Ambulatory Visit: Payer: Self-pay | Admitting: *Deleted

## 2019-01-29 ENCOUNTER — Telehealth: Payer: Self-pay

## 2019-01-29 ENCOUNTER — Encounter: Payer: Self-pay | Admitting: Family Medicine

## 2019-01-29 ENCOUNTER — Other Ambulatory Visit: Payer: Self-pay

## 2019-01-29 ENCOUNTER — Other Ambulatory Visit: Payer: No Typology Code available for payment source

## 2019-01-29 ENCOUNTER — Telehealth: Payer: Self-pay | Admitting: *Deleted

## 2019-01-29 VITALS — Temp 97.4°F | Wt 98.0 lb

## 2019-01-29 DIAGNOSIS — R519 Headache, unspecified: Secondary | ICD-10-CM

## 2019-01-29 DIAGNOSIS — Z7189 Other specified counseling: Secondary | ICD-10-CM | POA: Diagnosis not present

## 2019-01-29 DIAGNOSIS — R51 Headache: Secondary | ICD-10-CM | POA: Diagnosis not present

## 2019-01-29 DIAGNOSIS — R110 Nausea: Secondary | ICD-10-CM

## 2019-01-29 DIAGNOSIS — R197 Diarrhea, unspecified: Secondary | ICD-10-CM

## 2019-01-29 DIAGNOSIS — Z20822 Contact with and (suspected) exposure to covid-19: Secondary | ICD-10-CM

## 2019-01-29 NOTE — Progress Notes (Signed)
Virtual Visit via Video Note  I connected with Monica Grant  on 01/29/19 at  1:00 PM EDT by a video enabled telemedicine application and verified that I am speaking with the correct person using two identifiers.  Location patient: home Location provider:work or home office Persons participating in the virtual visit: patient, provider  I discussed the limitations of evaluation and management by telemedicine and the availability of in person appointments. The patient expressed understanding and agreed to proceed.   HPI:  Acute visit for nausea and headaches: -this started 2 days ago after working out in the sun for a long time in the sun, it was hot -comes and goes -symptoms: waves of nausea, mild headaches on and off, loss of appetite, tickle in her throat,mild diarrhea yesterday - no blood in the stools -no fevers, abd pain, vomiting, hematochezia, melena, cough, SOB, severe headache, rashes, tick bite, vision changes, speech changes, neurological symptoms otherwise -she has a history of mild headaches this time of the year with allergies -temp: 97.4 this is normal for her -sister passed away recently, they were at a funeral 2 weeks agp, stayed in a hotel -was around a lot of people, not everyone had masks -no sick contacts that she is aware of -she has been stressed -she is worried about Coronavirus and would like to get tested   ROS: See pertinent positives and negatives per HPI.  Past Medical History:  Diagnosis Date  . Elevated CA 19-9 level   . Hyperlipidemia   . Hypertension   . Kidney stones   . Pancreatic cyst     Past Surgical History:  Procedure Laterality Date  . CARDIAC CATHETERIZATION      Family History  Problem Relation Age of Onset  . Pancreatic cancer Father 12       pancreatic  . Cancer Sister        ? type  . COPD Sister   . Heart disease Sister   . Mental illness Sister   . Heart disease Brother 40       MI  . Heart disease Paternal Grandfather   .  Ovarian cancer Mother   . Lymphoma Mother   . Heart disease Sister   . Stomach cancer Neg Hx   . Throat cancer Neg Hx     SOCIAL HX: see hpi   Current Outpatient Medications:  .  amLODipine (NORVASC) 2.5 MG tablet, Take 1 tablet (2.5 mg total) by mouth daily., Disp: 90 tablet, Rfl: 0 .  atorvastatin (LIPITOR) 10 MG tablet, Take 1 tablet (10 mg total) by mouth daily., Disp: 90 tablet, Rfl: 0 .  Cholecalciferol (VITAMIN D3) 2000 units TABS, Take 1 tablet by mouth daily., Disp: , Rfl:   EXAM:  VITALS per patient if applicable: Temp 16.1, BP 1teens over 70s which is normal for her  GENERAL: alert, oriented, appears well and in no acute distress  HEENT: atraumatic, conjunttiva clear, no obvious abnormalities on inspection of external nose and ears. moist mucus membranes  NECK: normal movements of the head and neck  LUNGS: on inspection no signs of respiratory distress, breathing rate appears normal, no obvious gross SOB, gasping or wheezing  CV: no obvious cyanosis  MS: moves all visible extremities without noticeable abnormality  PSYCH/NEURO: pleasant and cooperative, no obvious depression or anxiety, speech and thought processing grossly intact  ASSESSMENT AND PLAN:  Discussed the following assessment and plan: More than 50% of over 25 minutes spent in total in caring for this patient was  spent counseling and/or coordinating care.   Diarrhea, unspecified type  Nausea   Nonintractable headache, unspecified chronicity pattern, unspecified headache type   Advice Given About 2019 Novel Coronavirus Infection  -we discussed possible serious and likely etiologies, workup and treatment, treatment risks and return precautions -after this discussion, Monica Grant opted for COVID19 testing, OTC symptomatic care, return precuations -follow up advised Monday or Tuesday, self isolation -of course, we advised Monica Grant  to return or notify a doctor immediately if symptoms worsen or persist or new  concerns arise.    I discussed the assessment and treatment plan with the patient. The patient was provided an opportunity to ask questions and all were answered. The patient agreed with the plan and demonstrated an understanding of the instructions.   Patient Instructions  I have asked my assistant to order Coronavirus (COVID19) testing for you. Please call our office if you have any concerns or questions or this testing has not been arranged in the next 24-48 hours.   Self Isolate: -see the CDC site for information:   [email or web address].za.html   -stay home except for to seek medical care -stay in your own room away from others in your house, wash hands frequently, wear a mask if you leave your room and interacted as little as possible with others -seek medical care if worsening - call out office for a visit or call ahead if going elsewhere -seek emergency care if very sick or severe symptoms - call 911 -isolate for at least 10 days from the onset of symptoms PLUS 3 days of no fever PLUS 3 days of improving symptoms, unless instructed otherwise by a doctor.   Follow up Monday or Tuesday with PCP or Dr. Maudie Mercury. Sooner as needed.     Follow up instructions: Advised assistant Wendie Simmer to help patient arrange the following: -testing for coronavirus -follow up with me or Dr. Martinique on Monday or Tuesday  Monica Kern, DO

## 2019-01-29 NOTE — Telephone Encounter (Signed)
Community message sent to the [facility] for COVID testing and the pt is aware someone will call with appt info.  Follow up visit scheduled with Dr Martinique on 6/15.

## 2019-01-29 NOTE — Telephone Encounter (Signed)
-----   Message from Lucretia Kern, DO sent at 01/29/2019  1:24 PM EDT ----- -testing for coronavirus-follow up with me or Dr. Martinique on Monday or Tuesday

## 2019-01-29 NOTE — Patient Instructions (Addendum)
I have asked my assistant to order Coronavirus (COVID19) testing for you. Please call our office if you have any concerns or questions or this testing has not been arranged in the next 24-48 hours.   Self Isolate: -see the CDC site for information:   RunningShows.co.za.html   -stay home except for to seek medical care -stay in your own room away from others in your house, wash hands frequently, wear a mask if you leave your room and interacted as little as possible with others -seek medical care if worsening - call out office for a visit or call ahead if going elsewhere -seek emergency care if very sick or severe symptoms - call 911 -isolate for at least 10 days from the onset of symptoms PLUS 3 days of no fever PLUS 3 days of improving symptoms, unless instructed otherwise by a doctor.   Follow up Monday or Tuesday with PCP or Dr. Maudie Mercury. Sooner as needed.

## 2019-01-29 NOTE — Telephone Encounter (Signed)
Patient called and advised of the request for covid testing, she verbalized understanding. Appointment scheduled for today at 1515 at [facility], I advised to head on over as the last test is at 1545 and she will be tested today, advised to wear a mask for everyone in the vehicle, she verbalized understanding. Order placed.

## 2019-01-29 NOTE — Telephone Encounter (Signed)
Patient had to travel out of town recently for funeral and she has developed nausea and headache since returning. Call to office for appointment.  Reason for Disposition . [1] COVID-19 infection suspected by caller or triager AND [2] mild symptoms (cough, fever, or others) AND [3] no complications or SOB  Answer Assessment - Initial Assessment Questions 1. COVID-19 DIAGNOSIS: "Who made your Coronavirus (COVID-19) diagnosis?" "Was it confirmed by a positive lab test?" If not diagnosed by a HCP, ask "Are there lots of cases (community spread) where you live?" (See public health department website, if unsure)     Patient has traveled out of state to [place] and she is concerned about some symptoms she is having 2. ONSET: "When did the COVID-19 symptoms start?"      Tuesday- patient started with sinus symptoms 3. WORST SYMPTOM: "What is your worst symptom?" (e.g., cough, fever, shortness of breath, muscle aches)     nausea 4. COUGH: "Do you have a cough?" If so, ask: "How bad is the cough?"       no 5. FEVER: "Do you have a fever?" If so, ask: "What is your temperature, how was it measured, and when did it start?"     no 6. RESPIRATORY STATUS: "Describe your breathing?" (e.g., shortness of breath, wheezing, unable to speak)      normal 7. BETTER-SAME-WORSE: "Are you getting better, staying the same or getting worse compared to yesterday?"  If getting worse, ask, "In what way?"     Same- nausea is same- no appetite, soft stool one day   8. HIGH RISK DISEASE: "Do you have any chronic medical problems?" (e.g., asthma, heart or lung disease, weak immune system, etc.)     High blood pressure 9. PREGNANCY: "Is there any chance you are pregnant?" "When was your last menstrual period?"     n/a 10. OTHER SYMPTOMS: "Do you have any other symptoms?"  (e.g., chills, fatigue, headache, loss of smell or taste, muscle pain, sore throat)       Headache, some chills  Protocols used: CORONAVIRUS (COVID-19)  DIAGNOSED OR SUSPECTED-A-AH

## 2019-01-31 LAB — NOVEL CORONAVIRUS, NAA: SARS-CoV-2, NAA: NOT DETECTED

## 2019-02-02 ENCOUNTER — Encounter: Payer: Self-pay | Admitting: Family Medicine

## 2019-02-02 ENCOUNTER — Ambulatory Visit (INDEPENDENT_AMBULATORY_CARE_PROVIDER_SITE_OTHER): Payer: No Typology Code available for payment source | Admitting: Family Medicine

## 2019-02-02 ENCOUNTER — Other Ambulatory Visit: Payer: Self-pay

## 2019-02-02 DIAGNOSIS — R51 Headache: Secondary | ICD-10-CM

## 2019-02-02 DIAGNOSIS — R519 Headache, unspecified: Secondary | ICD-10-CM

## 2019-02-02 NOTE — Progress Notes (Signed)
Virtual Visit via Video Note   I connected with Monica Grant on 02/04/19 at  8:45 AM EDT by a video enabled telemedicine application and verified that I am speaking with the correct person using two identifiers.  Location patient: home Location provider:work office Persons participating in the virtual visit: patient, provider  I discussed the limitations of evaluation and management by telemedicine and the availability of in person appointments. The patient expressed understanding and agreed to proceed.   HPI: Monica Grant is a 63 yo female with hx of HTN and HLD who is following on recent visit. She was seen on 01/29/2019, when she was complaining about headache, nausea,and diarrhea. Negative for clear sick contact. She has been under some stress lately.  She states that symptoms have resolved, including headache. She started feeling better 01/30/19. She had loose stool 6/10 and 6/11.  She denies fever, chills, unusual fatigue, visual changes, sore throat, cough, wheezing, dyspnea, abdominal pain, nausea, vomiting, or urinary symptoms.  She has been checking BP and taking her Amlodipine 2.5 mg, BP is "good."  She has no further concerns today.  ROS: See pertinent positives and negatives per HPI.  Past Medical History:  Diagnosis Date  . Elevated CA 19-9 level   . Hyperlipidemia   . Hypertension   . Kidney stones   . Pancreatic cyst     Past Surgical History:  Procedure Laterality Date  . CARDIAC CATHETERIZATION      Family History  Problem Relation Age of Onset  . Pancreatic cancer Father 98       pancreatic  . Cancer Sister        ? type  . COPD Sister   . Heart disease Sister   . Mental illness Sister   . Heart disease Brother 21       MI  . Heart disease Paternal Grandfather   . Ovarian cancer Mother   . Lymphoma Mother   . Heart disease Sister   . Stomach cancer Neg Hx   . Throat cancer Neg Hx     Social History   Socioeconomic History  . Marital  status: Married    Spouse name: Not on file  . Number of children: 2  . Years of education: Not on file  . Highest education level: Not on file  Occupational History  . Not on file  Social Needs  . Financial resource strain: Not on file  . Food insecurity    Worry: Not on file    Inability: Not on file  . Transportation needs    Medical: Not on file    Non-medical: Not on file  Tobacco Use  . Smoking status: Former Smoker    Types: Cigarettes    Quit date: 04/04/1987    Years since quitting: 31.8  . Smokeless tobacco: Never Used  Substance and Sexual Activity  . Alcohol use: No  . Drug use: No  . Sexual activity: Yes    Partners: Male    Comment: 1st intercourse 35 yo-1 partner  Lifestyle  . Physical activity    Days per week: Not on file    Minutes per session: Not on file  . Stress: Not on file  Relationships  . Social Herbalist on phone: Not on file    Gets together: Not on file    Attends religious service: Not on file    Active member of club or organization: Not on file    Attends meetings of clubs  or organizations: Not on file    Relationship status: Not on file  . Intimate partner violence    Fear of current or ex partner: Not on file    Emotionally abused: Not on file    Physically abused: Not on file    Forced sexual activity: Not on file  Other Topics Concern  . Not on file  Social History Narrative  . Not on file      Current Outpatient Medications:  .  amLODipine (NORVASC) 2.5 MG tablet, Take 1 tablet (2.5 mg total) by mouth daily., Disp: 90 tablet, Rfl: 0 .  atorvastatin (LIPITOR) 10 MG tablet, Take 1 tablet (10 mg total) by mouth daily., Disp: 90 tablet, Rfl: 0 .  Cholecalciferol (VITAMIN D3) 2000 units TABS, Take 1 tablet by mouth daily., Disp: , Rfl:   EXAM:  VITALS per patient if applicable:N/A   GENERAL: alert, oriented, appears well and in no acute distress  HEENT: atraumatic, conjunctiva clear, no obvious abnormalities on  inspection of external nose and ears  NECK: normal movements of the head and neck  LUNGS: on inspection no signs of respiratory distress, breathing rate appears normal, no obvious gross SOB, gasping or wheezing  CV: no obvious cyanosis  PSYCH/NEURO: pleasant and cooperative, no obvious depression or anxiety, speech and thought processing grossly intact  ASSESSMENT AND PLAN:  Discussed the following assessment and plan:  Headache, unspecified headache type  Headache, nausea, and diarrhea have all resolved. ? Viral illness. COVID 19 was negative.  Instructed to monitor for recurrence. Clearly instructed about warning signs.  10 min face to face OV. > 50% was dedicated to discussion of differential Dx. I discussed the assessment and treatment plan with the patient. She was provided an opportunity to ask questions and all were answered. The patient agreed with the plan and demonstrated an understanding of the instructions.   The patient was advised to call back or seek an in-person evaluation if the symptoms worsen or if the condition fails to improve as anticipated.  Return if symptoms worsen or fail to improve.    Amra Shukla Martinique, MD

## 2019-02-04 ENCOUNTER — Encounter: Payer: Self-pay | Admitting: Family Medicine

## 2019-03-30 ENCOUNTER — Other Ambulatory Visit: Payer: Self-pay | Admitting: Family Medicine

## 2019-03-30 DIAGNOSIS — I10 Essential (primary) hypertension: Secondary | ICD-10-CM

## 2019-03-30 DIAGNOSIS — E785 Hyperlipidemia, unspecified: Secondary | ICD-10-CM

## 2019-03-31 ENCOUNTER — Other Ambulatory Visit: Payer: Self-pay | Admitting: Family Medicine

## 2019-03-31 DIAGNOSIS — I10 Essential (primary) hypertension: Secondary | ICD-10-CM

## 2019-03-31 DIAGNOSIS — E785 Hyperlipidemia, unspecified: Secondary | ICD-10-CM

## 2019-03-31 MED ORDER — AMLODIPINE BESYLATE 2.5 MG PO TABS: 2.5000 mg | ORAL_TABLET | Freq: Every day | ORAL | 0 refills | Status: DC

## 2019-03-31 MED ORDER — ATORVASTATIN CALCIUM 10 MG PO TABS: 10.0000 mg | ORAL_TABLET | Freq: Every day | ORAL | 0 refills | Status: DC

## 2019-04-14 ENCOUNTER — Other Ambulatory Visit: Payer: Self-pay

## 2019-04-14 ENCOUNTER — Ambulatory Visit (INDEPENDENT_AMBULATORY_CARE_PROVIDER_SITE_OTHER): Payer: No Typology Code available for payment source | Admitting: Family Medicine

## 2019-04-14 ENCOUNTER — Encounter: Payer: Self-pay | Admitting: Family Medicine

## 2019-04-14 VITALS — BP 120/80 | HR 70 | Temp 97.8°F | Resp 12 | Ht 62.5 in | Wt 98.5 lb

## 2019-04-14 DIAGNOSIS — Z13228 Encounter for screening for other metabolic disorders: Secondary | ICD-10-CM

## 2019-04-14 DIAGNOSIS — Z1329 Encounter for screening for other suspected endocrine disorder: Secondary | ICD-10-CM | POA: Diagnosis not present

## 2019-04-14 DIAGNOSIS — Z0000 Encounter for general adult medical examination without abnormal findings: Secondary | ICD-10-CM

## 2019-04-14 DIAGNOSIS — Z130 Encounter for screening for diseases of the blood and blood-forming organs and certain disorders involving the immune mechanism: Secondary | ICD-10-CM

## 2019-04-14 DIAGNOSIS — I10 Essential (primary) hypertension: Secondary | ICD-10-CM

## 2019-04-14 DIAGNOSIS — E785 Hyperlipidemia, unspecified: Secondary | ICD-10-CM | POA: Diagnosis not present

## 2019-04-14 MED ORDER — AMLODIPINE BESYLATE 2.5 MG PO TABS: 2.5000 mg | ORAL_TABLET | Freq: Every day | ORAL | 2 refills | Status: DC

## 2019-04-14 MED ORDER — ATORVASTATIN CALCIUM 10 MG PO TABS: 10.0000 mg | ORAL_TABLET | Freq: Every day | ORAL | 2 refills | Status: DC

## 2019-04-14 NOTE — Progress Notes (Signed)
HPI:   Monica Grant is a 63 y.o. female, who is here today for her routine physical.  Last CPE: 01/14/18  Regular exercise 3 or more time per week: She is walking for 30 to 45 minutes 5 times per week. Following a healthful diet: Yes.   She lives with her husband.  Chronic medical problems: HTN,palpitations,HLD,and pancreatic cyst among some.  No new problems since her last OV. She continues following with GI to follow on pancreatic cyst, every 1-2 years. Palpitations,reporting negative cardiac work up and resolved after giving up coffee and chocolate.  Pap smear 2017.  She is planning on arranging appointment, postponed because she is afraid of COVID-19 exposure. She sees Dr Phineas Real.  Immunization History  Administered Date(s) Administered  . Tdap 01/14/2018    Mammogram: 05/2018. Colonoscopy: 01/2016. DEXA: N/A  Hep C screening : 04/2016. Former smoker.  She has no concerns today.  Hyperlipidemia, currently she is on atorvastatin 10 mg daily. She has been consistent with following a low-fat diet. Tolerating medication well.  Lab Results  Component Value Date   CHOL 163 01/14/2018   HDL 86 01/14/2018   LDLCALC 65 01/14/2018   TRIG 42 01/14/2018   CHOLHDL 1.9 01/14/2018   HTN:BP readings at home low 100s/70s. Occasionally she has some lightheadedness when she gets up fast. Negative for headache, chest pain, dyspnea, or edema.  She is on amlodipine 2.5 mg daily.  Lab Results  Component Value Date   CREATININE 0.60 06/19/2018   BUN 11 04/11/2018   NA 136 04/11/2018   K 3.7 04/11/2018   CL 98 04/11/2018   CO2 24 04/11/2018   Review of Systems  Constitutional: Negative for appetite change, fatigue and fever.  HENT: Negative for dental problem, hearing loss, mouth sores, sore throat, trouble swallowing and voice change.   Eyes: Negative for redness and visual disturbance.  Respiratory: Negative for cough, shortness of breath and wheezing.    Cardiovascular: Negative for chest pain and leg swelling.  Gastrointestinal: Negative for abdominal pain, nausea and vomiting.       No changes in bowel habits.  Endocrine: Negative for cold intolerance, heat intolerance, polydipsia, polyphagia and polyuria.  Genitourinary: Negative for decreased urine volume, dysuria, hematuria, vaginal bleeding and vaginal discharge.  Musculoskeletal: Negative for arthralgias, gait problem and myalgias.  Skin: Negative for color change and rash.  Allergic/Immunologic: Negative for environmental allergies.  Neurological: Negative for syncope, weakness and headaches.  Hematological: Negative for adenopathy. Does not bruise/bleed easily.  Psychiatric/Behavioral: Negative for confusion and sleep disturbance. The patient is not nervous/anxious.   All other systems reviewed and are negative.  Current Outpatient Medications on File Prior to Visit  Medication Sig Dispense Refill  . Cholecalciferol (VITAMIN D3) 2000 units TABS Take 1 tablet by mouth daily.     No current facility-administered medications on file prior to visit.     Past Medical History:  Diagnosis Date  . Elevated CA 19-9 level   . Hyperlipidemia   . Hypertension   . Kidney stones   . Pancreatic cyst     Past Surgical History:  Procedure Laterality Date  . CARDIAC CATHETERIZATION      Allergies  Allergen Reactions  . Aspirin Other (See Comments)    GI upset  . Codeine Other (See Comments)    GI upset    Family History  Problem Relation Age of Onset  . Pancreatic cancer Father 54       pancreatic  .  Cancer Sister        ? type  . COPD Sister   . Heart disease Sister   . Mental illness Sister   . Heart disease Brother 81       MI  . Heart disease Paternal Grandfather   . Ovarian cancer Mother   . Lymphoma Mother   . Heart disease Sister   . Stomach cancer Neg Hx   . Throat cancer Neg Hx     Social History   Socioeconomic History  . Marital status: Married     Spouse name: Not on file  . Number of children: 2  . Years of education: Not on file  . Highest education level: Not on file  Occupational History  . Not on file  Social Needs  . Financial resource strain: Not on file  . Food insecurity    Worry: Not on file    Inability: Not on file  . Transportation needs    Medical: Not on file    Non-medical: Not on file  Tobacco Use  . Smoking status: Former Smoker    Types: Cigarettes    Quit date: 04/04/1987    Years since quitting: 32.0  . Smokeless tobacco: Never Used  Substance and Sexual Activity  . Alcohol use: No  . Drug use: No  . Sexual activity: Yes    Partners: Male    Comment: 1st intercourse 67 yo-1 partner  Lifestyle  . Physical activity    Days per week: Not on file    Minutes per session: Not on file  . Stress: Not on file  Relationships  . Social Herbalist on phone: Not on file    Gets together: Not on file    Attends religious service: Not on file    Active member of club or organization: Not on file    Attends meetings of clubs or organizations: Not on file    Relationship status: Not on file  Other Topics Concern  . Not on file  Social History Narrative  . Not on file    Vitals:   04/14/19 0804  BP: 120/80  Pulse: 70  Resp: 12  Temp: 97.8 F (36.6 C)  SpO2: 99%   Body mass index is 17.73 kg/m.   Wt Readings from Last 3 Encounters:  04/14/19 98 lb 8 oz (44.7 kg)  01/29/19 98 lb (44.5 kg)  01/21/19 99 lb (44.9 kg)    Physical Exam  Nursing note and vitals reviewed. Constitutional: She is oriented to person, place, and time. She appears well-developed and in no distress.  HENT:  Head: Normocephalic and atraumatic.  Right Ear: Hearing, tympanic membrane, external ear and ear canal normal.  Left Ear: Hearing, tympanic membrane, external ear and ear canal normal.  Mouth/Throat: Uvula is midline, oropharynx is clear and moist and mucous membranes are normal.  Eyes: Pupils are equal,  round, and reactive to light. Conjunctivae and EOM are normal.  Neck: No tracheal deviation present. No thyromegaly present.  Cardiovascular: Normal rate and regular rhythm.  No murmur heard. Pulses:      Dorsalis pedis pulses are 2+ on the right side, and 2+ on the left side.  Respiratory: Effort normal and breath sounds normal. No respiratory distress.  GI: Soft. She exhibits no mass. There is no hepatomegaly. There is no tenderness.  Genitourinary:Comments: Deferred to gyn.  Musculoskeletal: She exhibits no edema.  No major deformity or signs of synovitis appreciated.  Lymphadenopathy:  She has no cervical adenopathy.       Right: No supraclavicular adenopathy present.       Left: No supraclavicular adenopathy present.  Neurological: She is alert and oriented to person, place, and time. She has normal strength. No cranial nerve deficit. Coordination and gait normal.  Reflex Scores:      Bicep reflexes are 2+ on the right side and 2+ on the left side.      Patellar reflexes are 2+ on the right side and 2+ on the left side. Skin: Skin is warm. No rash noted. No erythema.  Psychiatric: She has a normal mood and affect. Cognitive function grossly intact. Well groomed, good eye contact.     ASSESSMENT AND PLAN:  Monica Grant was here today annual physical examination.  Orders Placed This Encounter  Procedures  . Comprehensive metabolic panel  . Lipid panel    Lab Results  Component Value Date   CREATININE 0.66 04/14/2019   BUN 12 04/14/2019   NA 142 04/14/2019   K 4.4 04/14/2019   CL 105 04/14/2019   CO2 26 04/14/2019   Lab Results  Component Value Date   CHOL 176 04/14/2019   HDL 80 04/14/2019   LDLCALC 82 04/14/2019   TRIG 68 04/14/2019   CHOLHDL 2.2 04/14/2019   Lab Results  Component Value Date   ALT 15 04/14/2019   AST 19 04/14/2019   ALKPHOS 94 11/08/2015   BILITOT 0.6 04/14/2019    Routine general medical examination at a health care facility  We discussed the importance of regular physical activity and healthy diet for prevention of chronic illness and/or complications. Preventive guidelines reviewed. Vaccination up to date She will continue following with gynecologist for her female preventive care.  Ca++ and vit D supplementation recommended. Ca++ through her diet preferable.  Next CPE in a year.  Hyperlipidemia, unspecified hyperlipidemia type It has been well controlled. Continue Atorvastatin 10 mg daily. Will adjust dose if needed and according to FLP results.   -     atorvastatin (LIPITOR) 10 MG tablet; Take 1 tablet (10 mg total) by mouth daily. -     Lipid panel  Screening for endocrine, metabolic and immunity disorder -     Cancel: Comprehensive metabolic panel -     Comprehensive metabolic panel  Hypertension, essential, benign BP running on lower normal range + having some lightheadedness. Recommend monitoring BP a few times per week for 2 to 3 weeks, if readings are in the low 100s/60s, we can consider stopping amlodipine and continue nonpharmacologic treatment. Continue low salt diet.  -     amLODipine (NORVASC) 2.5 MG tablet; Take 1 tablet (2.5 mg total) by mouth daily.    Return in 1 year (on 04/13/2020) for CPE.   Redford Behrle G. Martinique, MD  [facility]. [place] office.

## 2019-04-14 NOTE — Patient Instructions (Signed)
  It was nice to see you today. A few things to remember from today's visit:   Routine general medical examination at a health care facility  Hyperlipidemia, unspecified hyperlipidemia type - Plan: Lipid panel  Screening for endocrine, metabolic and immunity disorder - Plan: Comprehensive metabolic panel  Today you have you routine preventive visit.  At least 150 minutes of moderate exercise per week, daily brisk walking for 15-30 min is a good exercise option. Healthy diet low in saturated (animal) fats and sweets and consisting of fresh fruits and vegetables, lean meats such as fish and white chicken and whole grains.  These are some of recommendations for screening depending of age and risk factors:   - Vaccines:  Tdap vaccine every 10 years.  Shingles vaccine recommended at age 21, could be given after 63 years of age but not sure about insurance coverage.   Pneumonia vaccines:  Prevnar 13 at 65 and Pneumovax at 10. Sometimes Pneumovax is giving earlier if history of smoking, lung disease,diabetes,kidney disease among some.    Screening for diabetes at age 75 and every 3 years.  Cervical cancer prevention:  Pap smear starts at 63 years of age and continues periodically until 63 years old in low risk women. Pap smear every 3 years between 64 and 79 years old. Pap smear every 3-5 years between women 69 and older if pap smear negative and HPV screening negative.   -Breast cancer: Mammogram: There is disagreement between experts about when to start screening in low risk asymptomatic female but recent recommendations are to start screening at 76 and not later than 63 years old , every 1-2 years and after 63 yo q 2 years. Screening is recommended until 62 years old but some women can continue screening depending of healthy issues.   Colon cancer screening: starts at 63 years old until 63 years old.  Also recommended:  1. Dental visit- Brush and floss your teeth twice daily;  visit your dentist twice a year. 2. Eye doctor- Get an eye exam at least every 2 years. 3. Helmet use- Always wear a helmet when riding a bicycle, motorcycle, rollerblading or skateboarding. 4. Safe sex- If you may be exposed to sexually transmitted infections, use a condom. 5. Seat belts- Seat belts can save your live; always wear one. 6. Smoke/Carbon Monoxide detectors- These detectors need to be installed on the appropriate level of your home. Replace batteries at least once a year. 7. Skin cancer- When out in the sun please cover up and use sunscreen 15 SPF or higher. 8. Violence- If anyone is threatening or hurting you, please tell your healthcare provider.  9. Drink alcohol in moderation- Limit alcohol intake to one drink or less per day. Never drink and drive.  Take care!

## 2019-04-15 ENCOUNTER — Encounter: Payer: Self-pay | Admitting: Family Medicine

## 2019-04-15 LAB — LIPID PANEL
Cholesterol: 176 mg/dL (ref <200)
HDL: 80 mg/dL (ref >=50)
LDL Cholesterol (Calc): 82 mg/dL (calc)
Non-HDL Cholesterol (Calc): 96 mg/dL (calc) (ref <130)
Total CHOL/HDL Ratio: 2.2 (calc) (ref <5.0)
Triglycerides: 68 mg/dL (ref <150)

## 2019-04-15 LAB — COMPREHENSIVE METABOLIC PANEL
AG Ratio: 2 (calc) (ref 1.0–2.5)
ALT: 15 U/L (ref 6–29)
AST: 19 U/L (ref 10–35)
Albumin: 4.9 g/dL (ref 3.6–5.1)
Alkaline phosphatase (APISO): 87 U/L (ref 37–153)
BUN: 12 mg/dL (ref 7–25)
CO2: 26 mmol/L (ref 20–32)
Calcium: 10 mg/dL (ref 8.6–10.4)
Chloride: 105 mmol/L (ref 98–110)
Creat: 0.66 mg/dL (ref 0.50–0.99)
Globulin: 2.5 g/dL (calc) (ref 1.9–3.7)
Glucose, Bld: 89 mg/dL (ref 65–99)
Potassium: 4.4 mmol/L (ref 3.5–5.3)
Sodium: 142 mmol/L (ref 135–146)
Total Bilirubin: 0.6 mg/dL (ref 0.2–1.2)
Total Protein: 7.4 g/dL (ref 6.1–8.1)

## 2019-05-19 ENCOUNTER — Encounter: Payer: Self-pay | Admitting: Gynecology

## 2019-07-07 ENCOUNTER — Other Ambulatory Visit: Payer: Self-pay | Admitting: Family Medicine

## 2019-07-07 DIAGNOSIS — I10 Essential (primary) hypertension: Secondary | ICD-10-CM

## 2019-07-07 DIAGNOSIS — E785 Hyperlipidemia, unspecified: Secondary | ICD-10-CM

## 2019-07-07 NOTE — Telephone Encounter (Signed)
Medication Refill - Medication: atorvastatin, amlodipine  Has the patient contacted their pharmacy? Yes.   (Agent: If no, request that the patient contact the pharmacy for the refill.) (Agent: If yes, when and what did the pharmacy advise?)  Preferred Pharmacy (with phone number or street name):  [facility] [address], [place] - [place]  [place] [address]  Phone: [phone] Fax: [phone]  Not a 24 hour pharmacy; exact hours not known.     Agent: Please be advised that RX refills may take up to 3 business days. We ask that you follow-up with your pharmacy.

## 2019-07-08 ENCOUNTER — Encounter: Payer: Self-pay | Admitting: Family Medicine

## 2019-07-08 DIAGNOSIS — E785 Hyperlipidemia, unspecified: Secondary | ICD-10-CM

## 2019-07-08 DIAGNOSIS — I10 Essential (primary) hypertension: Secondary | ICD-10-CM

## 2019-07-08 MED ORDER — ATORVASTATIN CALCIUM 10 MG PO TABS: 10.0000 mg | ORAL_TABLET | Freq: Every day | ORAL | 2 refills | Status: DC

## 2019-07-08 MED ORDER — AMLODIPINE BESYLATE 2.5 MG PO TABS: 2.5000 mg | ORAL_TABLET | Freq: Every day | ORAL | 2 refills | Status: DC

## 2019-07-10 ENCOUNTER — Encounter: Payer: Self-pay | Admitting: Family Medicine

## 2019-07-10 NOTE — Telephone Encounter (Signed)
ATC patient and [place] unable to leave a voicemail.

## 2019-07-11 ENCOUNTER — Ambulatory Visit (INDEPENDENT_AMBULATORY_CARE_PROVIDER_SITE_OTHER): Payer: No Typology Code available for payment source | Admitting: Family Medicine

## 2019-07-11 ENCOUNTER — Encounter: Payer: Self-pay | Admitting: Family Medicine

## 2019-07-11 VITALS — BP 124/79 | HR 81 | Temp 96.8°F | Ht 63.0 in | Wt 98.0 lb

## 2019-07-11 DIAGNOSIS — R300 Dysuria: Secondary | ICD-10-CM

## 2019-07-11 MED ORDER — CEPHALEXIN 500 MG PO CAPS: 500.0000 mg | ORAL_CAPSULE | Freq: Two times a day (BID) | ORAL | 0 refills | Status: DC

## 2019-07-11 NOTE — Progress Notes (Signed)
Virtual Visit via Video Note  I connected with Monica Grant on 07/11/19 at 10:00 AM EST by a video enabled telemedicine application and verified that I am speaking with the correct person using two identifiers.  Location: Patient: home alone Provider: home    I discussed the limitations of evaluation and management by telemedicine and the availability of in person appointments. The patient expressed understanding and agreed to proceed.  History of Present Illness: Pt is home c/o urinary frequency and dysuria since Wednesday.    No back pain + suprapubic pressure  No fevers  Pt has a hx of kidney stones but this feels completely different  Observations/Objective: Vitals:   07/11/19 0941  BP: 124/79  Pulse: 81  Temp: (!) 96.8 F (36 C)    Pt is in NAD Assessment and Plan: 1. Dysuria abx per orders  If no better Monday--- call pcp to arrange for UA/ culture  - cephALEXin (KEFLEX) 500 MG capsule; Take 1 capsule (500 mg total) by mouth 2 (two) times daily.  Dispense: 14 capsule; Refill: 0   Follow Up Instructions:    I discussed the assessment and treatment plan with the patient. The patient was provided an opportunity to ask questions and all were answered. The patient agreed with the plan and demonstrated an understanding of the instructions.   The patient was advised to call back or seek an in-person evaluation if the symptoms worsen or if the condition fails to improve as anticipated.  I provided 15 minutes of non-face-to-face time during this encounter.   Ann Held, DO

## 2019-07-20 ENCOUNTER — Encounter: Payer: Self-pay | Admitting: Family Medicine

## 2019-07-20 ENCOUNTER — Other Ambulatory Visit: Payer: Self-pay

## 2019-07-20 ENCOUNTER — Ambulatory Visit (INDEPENDENT_AMBULATORY_CARE_PROVIDER_SITE_OTHER): Payer: No Typology Code available for payment source | Admitting: Family Medicine

## 2019-07-20 ENCOUNTER — Telehealth: Payer: Self-pay | Admitting: Family Medicine

## 2019-07-20 ENCOUNTER — Ambulatory Visit (INDEPENDENT_AMBULATORY_CARE_PROVIDER_SITE_OTHER): Payer: No Typology Code available for payment source

## 2019-07-20 VITALS — BP 140/90 | HR 100 | Temp 97.6°F | Resp 12 | Ht 63.0 in | Wt 101.4 lb

## 2019-07-20 DIAGNOSIS — N200 Calculus of kidney: Secondary | ICD-10-CM | POA: Diagnosis not present

## 2019-07-20 DIAGNOSIS — R310 Gross hematuria: Secondary | ICD-10-CM | POA: Diagnosis not present

## 2019-07-20 DIAGNOSIS — R300 Dysuria: Secondary | ICD-10-CM

## 2019-07-20 LAB — POC URINALSYSI DIPSTICK (AUTOMATED)
Bilirubin, UA: NEGATIVE
Glucose, UA: NEGATIVE
Ketones, UA: NEGATIVE
Nitrite, UA: NEGATIVE
Protein, UA: NEGATIVE
Spec Grav, UA: 1.01 (ref 1.010–1.025)
Urobilinogen, UA: 0.2 E.U./dL
pH, UA: 8 (ref 5.0–8.0)

## 2019-07-20 MED ORDER — SULFAMETHOXAZOLE-TRIMETHOPRIM 800-160 MG PO TABS: 1.0000 | ORAL_TABLET | Freq: Two times a day (BID) | ORAL | 0 refills | Status: AC

## 2019-07-20 MED ORDER — ALIGN 4 MG PO CAPS: 1.0000 | ORAL_CAPSULE | Freq: Every day | ORAL | 0 refills | Status: AC

## 2019-07-20 NOTE — Patient Instructions (Signed)
A few things to remember from today's visit:   Gross hematuria - Plan: POCT Urinalysis Dipstick (Automated), DG Abd 1 View, Urine culture, DG Abd 1 View  Dysuria - Plan: Urine culture  Nephrolithiasis - Plan: DG Abd 1 View, DG Abd 1 View   Adequate fluid intake, avoid holding urine for long hours, and over the counter Vit C OR cranberry capsules might help.  Today we will treat empirically with antibiotic, which we might need to change when urine culture comes back depending of bacteria susceptibility.  Seek immediate medical attention if severe abdominal pain, vomiting, fever/chills, or worsening symptoms.  Please be sure medication list is accurate. If a new problem present, please set up appointment sooner than planned today.

## 2019-07-20 NOTE — Progress Notes (Signed)
HPI:  Chief Complaint  Patient presents with   Dysuria   Hematuria    [name] is a 63 y.o. female, who is here today complaining of 12 of urinary symptoms.  Dysuria: Yes, she still has burning sensation without urination. Urinary frequency: Yes Urinary urgency: Yes Incontinence: Denies Gross hematuria: Yes Symptoms improved but not resolved (except for gross hematuria) with 7 days of cephalexin treatment. Gross hematuria started again this morning.  Yesterday she noted right lower back pain, no radiated, mild.  She denies fever, chills, change in appetite, or unusual fatigue. Negative for abdominal pain, nausea, vomiting, changes in bowel habits.  Abnormal vaginal bleeding or discharge: Denies  LMP: Postmenopausal Hx of UTI: Negative  She has history of nephrolithiasis, which has presented with severe bilateral lower back pain.   Review of Systems  Constitutional: Negative for activity change, appetite change and fatigue.  Gastrointestinal: Negative for abdominal distention and blood in stool.  Genitourinary: Negative for decreased urine volume, difficulty urinating, flank pain and pelvic pain.  Musculoskeletal: Negative for gait problem and myalgias.  Neurological: Negative for syncope and headaches.  Psychiatric/Behavioral: Negative for confusion. The patient is nervous/anxious.   Rest see pertinent positives and negatives per HPI.   Current Outpatient Medications on File Prior to Visit  Medication Sig Dispense Refill   amLODipine (NORVASC) 2.5 MG tablet Take 1 tablet (2.5 mg total) by mouth daily. 90 tablet 2   atorvastatin (LIPITOR) 10 MG tablet Take 1 tablet (10 mg total) by mouth daily. 90 tablet 2   cephALEXin (KEFLEX) 500 MG capsule Take 1 capsule (500 mg total) by mouth 2 (two) times daily. 14 capsule 0   Cholecalciferol (VITAMIN D3) 2000 units TABS Take 1 tablet by mouth daily.     No current facility-administered medications on file  prior to visit.      Past Medical History:  Diagnosis Date   Elevated CA 19-9 level    Hyperlipidemia    Hypertension    Kidney stones    Pancreatic cyst    Allergies  Allergen Reactions   Aspirin Other (See Comments)    GI upset   Codeine Other (See Comments)    GI upset    Social History   Socioeconomic History   Marital status: Married    Spouse name: Not on file   Number of children: 2   Years of education: Not on file   Highest education level: Not on file  Occupational History   Not on file  Social Needs   Financial resource strain: Not on file   Food insecurity    Worry: Not on file    Inability: Not on file   Transportation needs    Medical: Not on file    Non-medical: Not on file  Tobacco Use   Smoking status: Former Smoker    Types: Cigarettes    Quit date: 04/04/1987    Years since quitting: 32.3   Smokeless tobacco: Never Used  Substance and Sexual Activity   Alcohol use: No   Drug use: No   Sexual activity: Yes    Partners: Male    Comment: 1st intercourse 69 yo-1 partner  Lifestyle   Physical activity    Days per week: Not on file    Minutes per session: Not on file   Stress: Not on file  Relationships   Social connections    Talks on phone: Not on file    Gets together: Not on file  Attends religious service: Not on file    Active member of club or organization: Not on file    Attends meetings of clubs or organizations: Not on file    Relationship status: Not on file  Other Topics Concern   Not on file  Social History Narrative   Not on file    Vitals:   07/20/19 1028  BP: 140/90  Pulse: 100  Resp: 12  Temp: 97.6 F (36.4 C)  SpO2: 98%   Body mass index is 17.96 kg/m.    Physical Exam  Nursing note and vitals reviewed. Constitutional: She is oriented to person, place, and time. She appears well-developed. No distress.  HENT:  Head: Normocephalic and atraumatic.  Mouth/Throat: Oropharynx is  clear and moist and mucous membranes are normal.  Eyes: Pupils are equal, round, and reactive to light. Conjunctivae are normal.  Cardiovascular: Normal rate and regular rhythm.  No murmur heard. Respiratory: Effort normal and breath sounds normal. No respiratory distress.  GI: Soft. She exhibits no mass. There is no hepatomegaly. There is no abdominal tenderness. There is no CVA tenderness.  Musculoskeletal:        General: No edema.     Lumbar back: She exhibits no tenderness and no bony tenderness.  Lymphadenopathy:    She has no cervical adenopathy.  Neurological: She is alert and oriented to person, place, and time. She has normal strength. No cranial nerve deficit. Gait normal.  Skin: Skin is warm. No rash noted. No erythema.  Psychiatric: Her mood appears anxious.  Well groomed, good eye contact.   ASSESSMENT AND PLAN:  [name] was seen today for dysuria and hematuria.  Diagnoses and all orders for this visit:  Gross hematuria -     POCT Urinalysis Dipstick (Automated) -     DG Abd 1 View; Future -     Urine culture -     DG Abd 1 View  Dysuria Urine dipstick abnormal. We will treat empirically as UTI. Bactrim DS bid x 5 days. Side effects of abx discussed. Instructed about warning signs. We will follow Ucx.  -     Urine culture  Nephrolithiasis KUB done today. I discussed finding with [name], calcifications around right kidney, ?kidney stone. Pending formal report. Increase fluid intake. Instructed about warning signs.  If symptoms are persistent abdominal CT and/or urology referral will be considered. She agrees with plan and voices understanding.  Other orders -     sulfamethoxazole-trimethoprim (BACTRIM DS) 800-160 MG tablet; Take 1 tablet by mouth 2 (two) times daily for 5 days.  Return if symptoms worsen or fail to improve.      Damichael Hofman G. Martinique, MD  [facility]. [place] office.

## 2019-07-20 NOTE — Telephone Encounter (Signed)
Pt would like a return call from [place].  She would like the lab work she did this morning to be sent to [facility].

## 2019-07-20 NOTE — Telephone Encounter (Signed)
Spoke with patient. [facility] has already picked up the specimen.

## 2019-07-21 ENCOUNTER — Telehealth: Payer: Self-pay | Admitting: Family Medicine

## 2019-07-21 ENCOUNTER — Encounter: Payer: Self-pay | Admitting: Family Medicine

## 2019-07-21 MED ORDER — ONDANSETRON HCL 4 MG PO TABS: 4.0000 mg | ORAL_TABLET | Freq: Three times a day (TID) | ORAL | 0 refills | Status: AC | PRN

## 2019-07-21 NOTE — Telephone Encounter (Signed)
I sent a prescription for Zofran 4 mg to take every 8 hours as needed for nausea. Most antibiotics have GI side effects, she just completed cephalexin 7 days treatment.  I would like to try to complete at least 3 days of Bactrim DS treatment. Urine culture is still pending. Thanks, BJ

## 2019-07-21 NOTE — Telephone Encounter (Signed)
Patient is aware of Dr. Morrison Old message below. Nothing further needed.

## 2019-07-21 NOTE — Telephone Encounter (Signed)
sulfamethoxazole-trimethoprim (BACTRIM DS) 800-160 MG tablet  Pt was given yesterday and wants Dr Lenna Sciara to know it is making her extremely nauseated. She took a dose at 2:30 pm when she picked it up yesterday but did not take at @ 2:30 am this morning as it was making her so sick, asking for something for nausea or either to change medication. Please FU at [phone] asap

## 2019-07-22 LAB — URINE CULTURE
MICRO NUMBER:: 1146095
SPECIMEN QUALITY:: ADEQUATE

## 2019-07-24 ENCOUNTER — Encounter: Payer: Self-pay | Admitting: Family Medicine

## 2019-08-06 ENCOUNTER — Encounter: Payer: Self-pay | Admitting: Family Medicine

## 2019-08-06 DIAGNOSIS — R319 Hematuria, unspecified: Secondary | ICD-10-CM

## 2019-08-06 NOTE — Telephone Encounter (Signed)
Spoke to pt and she is wanting a referral for Urologist. I advised that per Dr.Jordans last note the next move would be abdominal CT and or Urologist referral. Per pt she was told that she can just get the referral. I advised that Dr.jordan was out of the office but I will route the message to her CMA. I also advised pt that if sx worsen or persist to go to the local UC. Pt verbalized understanding.

## 2019-08-07 ENCOUNTER — Other Ambulatory Visit: Payer: Self-pay | Admitting: Family Medicine

## 2019-08-07 ENCOUNTER — Telehealth: Payer: Self-pay

## 2019-08-07 DIAGNOSIS — R110 Nausea: Secondary | ICD-10-CM

## 2019-08-07 DIAGNOSIS — R300 Dysuria: Secondary | ICD-10-CM

## 2019-08-07 MED ORDER — NITROFURANTOIN MONOHYD MACRO 100 MG PO CAPS: 100.0000 mg | ORAL_CAPSULE | Freq: Two times a day (BID) | ORAL | 0 refills | Status: AC

## 2019-08-07 MED ORDER — ONDANSETRON HCL 4 MG PO TABS: 4.0000 mg | ORAL_TABLET | Freq: Three times a day (TID) | ORAL | 0 refills | Status: AC | PRN

## 2019-08-07 NOTE — Progress Notes (Signed)
She has completed 2 courses of antibiotic, cephalexin and Bactrim.Ucx with E. Coli sensitive to these. We need to consider other possible etiologies for dysuria. Urology referral has been placed, appointment is pending. We will treat again empirically with Macrobid 100 mg twice daily for 5 days. We discussed adverse effects of frequent antibiotic use. Antibiotics cause nausea, so Zofran 4 mg to take 3 times per day as needed also sent to her pharmacy. Janal Haak Martin[place], MD

## 2019-08-07 NOTE — Telephone Encounter (Signed)
Spoke with Dr. Martinique - she will call pt.

## 2019-08-07 NOTE — Telephone Encounter (Signed)
Copied from [place] [id number]. Topic: General - Other >> Aug 07, 2019  1:33 PM Yvette Rack wrote: Reason for CRM: Pt requests to speak with Dr. Doug Sou nurse Judson Roch. Pt requests call back. Cb# [phone]

## 2019-08-10 ENCOUNTER — Encounter: Payer: Self-pay | Admitting: Family Medicine

## 2019-09-09 ENCOUNTER — Telehealth: Payer: Self-pay | Admitting: *Deleted

## 2019-09-09 NOTE — Telephone Encounter (Signed)
Pt has visit with Dr. Delilah Shan on 09/25/19 for CE. Had an incidental finding of possible adnexal cyst found on CT being performed for Bladder issues. I advised Dr. Delilah Shan will evaluate it at her visit and order an [place] to better look at the ovary.  Pt advised we will do insurance verification for [place] prior apt.  Pt understands and we will see her on 09/25/19 for her visit. KW CMA

## 2019-09-22 ENCOUNTER — Other Ambulatory Visit: Payer: Self-pay

## 2019-09-24 ENCOUNTER — Ambulatory Visit (INDEPENDENT_AMBULATORY_CARE_PROVIDER_SITE_OTHER): Payer: No Typology Code available for payment source | Admitting: Obstetrics and Gynecology

## 2019-09-24 ENCOUNTER — Encounter: Payer: Self-pay | Admitting: Obstetrics and Gynecology

## 2019-09-24 ENCOUNTER — Other Ambulatory Visit: Payer: Self-pay

## 2019-09-24 VITALS — BP 122/78 | Ht 63.0 in | Wt 101.0 lb

## 2019-09-24 DIAGNOSIS — M810 Age-related osteoporosis without current pathological fracture: Secondary | ICD-10-CM | POA: Diagnosis not present

## 2019-09-24 DIAGNOSIS — Z124 Encounter for screening for malignant neoplasm of cervix: Secondary | ICD-10-CM

## 2019-09-24 DIAGNOSIS — Z01411 Encounter for gynecological examination (general) (routine) with abnormal findings: Secondary | ICD-10-CM

## 2019-09-24 DIAGNOSIS — N83202 Unspecified ovarian cyst, left side: Secondary | ICD-10-CM | POA: Diagnosis not present

## 2019-09-24 NOTE — Patient Instructions (Signed)
Please check the blood lab test before leaving today (CA 125) Please schedule bone density scan and pelvic ultrasound After those are done, we will have you back for an appointment to discuss the results and osteoporosis treatment

## 2019-09-24 NOTE — Progress Notes (Signed)
Monica Grant 10-Aug-1956 [id number]  SUBJECTIVE:  64 y.o. G2P2 female for annual routine gynecologic exam. Had an incidental finding of possible adnexal cyst found on CT being performed for bladder issues (09/03/19 at [facility]).  Radiology interpretation comment indicates the collection to left of uterus measures 4.6 cm.  She is not having any pain or pelvic pressure but notes some increased urinary frequency and trouble with recurrent bladder infections that she has been treated for.  Per review of the last annual exam note from 05/15/2018, it was noted that she had had previous imaging in 2018 or so that indicated her right ovary had a 1.7 cm simple cyst, and the left ovary had a 4.2 x 2.6 x 4.1 cm cyst and her CA-125 at that time was 8.  Apparently her follow-up ultrasound 02/2018 indicated the cyst was 5.1 x 3 cm.  Patient had been offered a BSO but elected expectant management.  According to her account her testing for BRCA 1 and 2 was negative, as her mother had undergone surgery for ovarian cancer, and she recalls the stage being 1A..  She has no gynecologic concerns. Current Outpatient Medications  Medication Sig Dispense Refill  . amLODipine (NORVASC) 2.5 MG tablet Take 1 tablet (2.5 mg total) by mouth daily. 90 tablet 2  . atorvastatin (LIPITOR) 10 MG tablet Take 1 tablet (10 mg total) by mouth daily. 90 tablet 2  . Cholecalciferol (VITAMIN D3) 2000 units TABS Take 1 tablet by mouth daily.     No current facility-administered medications for this visit.   Allergies: Aspirin and Codeine  No LMP recorded. Patient is postmenopausal.  Past medical history,surgical history, problem list, medications, allergies, family history and social history were all reviewed and documented as reviewed in the EPIC chart.  ROS:  Feeling well. No dyspnea or chest pain on exertion.  No abdominal pain, change in bowel habits, black or bloody stools.  No urinary tract symptoms. GYN ROS: normal menses, no  abnormal bleeding, pelvic pain or discharge, no breast pain or new or enlarging lumps on self exam. No neurological complaints.    OBJECTIVE:  BP 122/78   Ht '5\' 3"'  (1.6 m)   Wt 101 lb (45.8 kg)   BMI 17.89 kg/m  The patient appears well, alert, oriented x 3, in no distress. ENT normal.  Neck supple. No cervical or supraclavicular adenopathy or thyromegaly.  Lungs are clear, good air entry, no wheezes, rhonchi or rales. S1 and S2 normal, no murmurs, regular rate and rhythm.  Abdomen soft without tenderness, guarding, mass or organomegaly.  Neurological is normal, no focal findings.  BREAST EXAM: breasts appear normal, no suspicious masses, no skin or nipple changes or axillary nodes  PELVIC EXAM: VULVA: normal appearing vulva with no masses, tenderness or lesions, VAGINA: normal appearing vagina with normal color and discharge, no lesions, CERVIX: normal appearing cervix without discharge or lesions, UTERUS: uterus is normal size, shape, consistency and nontender, ADNEXA: mass present left side, size 3-4 cm, right adnexa is normal  Chaperone: [name] present during the examination  ASSESSMENT:  64 y.o. G2P2 here for annual gynecologic exam  PLAN:   1. Postmenopausal.  No specific concerns at this time. 2. Pap smear 06/2016. Pap smear is repeated today. No prior significant history of abnormal Pap smears.  3. Mammogram 05/2018. Will continue with annual mammography. Breast exam normal today. 4. Colonoscopy 2017. Recommended that she continue per the prescribed interval.   5.  Osteoporosis noted on DEXA 05/2018.  Discussion had been held with her regarding recommendations for medication treatments, but she wanted to try conservative management with dietary and weightbearing exercise intervention.  She has been taking vitamin D but not calcium due to her understanding of risks regarding adverse effects on cardiovascular health and also her recurrent kidney stone history.  We discussed  that treatment of osteoporosis is best managed with medications and she seems open to this idea now.  Since it has been about 18 months since her last DEXA, I think it would make more sense to get a new baseline DEXA which she will plan to schedule and then we can bring her back to discuss initiation of osteoporosis treatments. 6.  History of bilateral ovarian cysts.  Per the discussion above, the patient has had a persistent left ovarian cyst and has intermittently been followed for this.  She just had CT scan recently to evaluate for kidney stones and there was an incidental finding of this left adnexal fluid collection which presumably is the same cyst that has been seen in the past few years worth of imaging studies.  I recommended that she get a repeat CA-125 and get scheduled for a repeat pelvic ultrasound to better characterize the fluid collection and then we can have her back to discuss these results in addition to initiation of osteoporosis therapy as above.  We discussed that the cyst that size may be start to get big enough to be causing pelvic pain symptoms, including possibly some urinary symptoms.  May need further discussion on consideration for surgical removal but we will discuss that in detail at follow-up visits.  At the very least, the cyst would warrant ultrasound surveillance. 7. Health maintenance.  No lab work as she has this completed with her primary care provider.    Return annually or sooner, prn.  Joseph Pierini MD  09/24/19

## 2019-09-25 ENCOUNTER — Encounter: Payer: No Typology Code available for payment source | Admitting: Obstetrics and Gynecology

## 2019-09-25 LAB — CA 125: CA 125: 7 U/mL (ref <35)

## 2019-09-25 NOTE — Addendum Note (Signed)
Addended by: Nelva Nay on: 09/25/2019 12:50 PM   Modules accepted: Orders

## 2019-09-28 LAB — PAP IG W/ RFLX HPV ASCU

## 2019-10-05 ENCOUNTER — Telehealth: Payer: Self-pay | Admitting: Family Medicine

## 2019-10-05 DIAGNOSIS — I10 Essential (primary) hypertension: Secondary | ICD-10-CM

## 2019-10-05 DIAGNOSIS — E785 Hyperlipidemia, unspecified: Secondary | ICD-10-CM

## 2019-10-05 MED ORDER — ATORVASTATIN CALCIUM 10 MG PO TABS: 10.0000 mg | ORAL_TABLET | Freq: Every day | ORAL | 2 refills | Status: DC

## 2019-10-05 MED ORDER — AMLODIPINE BESYLATE 2.5 MG PO TABS: 2.5000 mg | ORAL_TABLET | Freq: Every day | ORAL | 2 refills | Status: DC

## 2019-10-05 NOTE — Telephone Encounter (Signed)
Patient needs a 90 day refill on Amlodipine and Atorvastatin.  Patient only has 3 days left.  Pharmacy- Kristopher Oppenheim at [facility]

## 2019-10-05 NOTE — Telephone Encounter (Signed)
Rx's sent in. °

## 2019-10-14 ENCOUNTER — Other Ambulatory Visit: Payer: Self-pay

## 2019-10-15 ENCOUNTER — Ambulatory Visit: Payer: No Typology Code available for payment source | Admitting: Obstetrics and Gynecology

## 2019-10-15 ENCOUNTER — Other Ambulatory Visit: Payer: Self-pay | Admitting: Obstetrics and Gynecology

## 2019-10-15 ENCOUNTER — Ambulatory Visit: Payer: No Typology Code available for payment source

## 2019-10-15 ENCOUNTER — Encounter: Payer: Self-pay | Admitting: Obstetrics and Gynecology

## 2019-10-15 VITALS — BP 122/78

## 2019-10-15 DIAGNOSIS — R87615 Unsatisfactory cytologic smear of cervix: Secondary | ICD-10-CM | POA: Diagnosis not present

## 2019-10-15 DIAGNOSIS — R978 Other abnormal tumor markers: Secondary | ICD-10-CM

## 2019-10-15 DIAGNOSIS — N858 Other specified noninflammatory disorders of uterus: Secondary | ICD-10-CM | POA: Diagnosis not present

## 2019-10-15 DIAGNOSIS — N83202 Unspecified ovarian cyst, left side: Secondary | ICD-10-CM | POA: Diagnosis not present

## 2019-10-15 DIAGNOSIS — N9489 Other specified conditions associated with female genital organs and menstrual cycle: Secondary | ICD-10-CM

## 2019-10-15 DIAGNOSIS — R1909 Other intra-abdominal and pelvic swelling, mass and lump: Secondary | ICD-10-CM

## 2019-10-15 DIAGNOSIS — Z124 Encounter for screening for malignant neoplasm of cervix: Secondary | ICD-10-CM

## 2019-10-15 NOTE — Progress Notes (Signed)
Monica Grant  [birth year] [id number]  HPI The patient is a 64 y.o. G2P2 who presents for follow-up from her routine annual visit with [place] on 09/24/2019 for a pelvic ultrasound to evaluate a left adnexal cyst that was incidentally found on a CT of the abdomen and pelvis, and she is here and to repeat her Pap smear as the cytology was insufficient at her last exam.  Please see the previous note from 09/24/2019 for details.  Her Pap smear was also insufficient and we will be collecting a repeat Pap smear today.  Past medical history,surgical history, problem list, medications, allergies, family history and social history were all reviewed and documented as reviewed in the EPIC chart.  ROS:  Feeling well. No dyspnea or chest pain on exertion.  No abdominal pain, change in bowel habits, black or bloody stools.  +urinary urgency. GYN ROS: no abnormal bleeding, pelvic pain or discharge  Physical Exam  BP 122/78   General: Pleasant female, no acute distress, alert and oriented PELVIC EXAM: VULVA: normal appearing vulva with no masses, tenderness or lesions, VAGINA: normal appearing vagina with normal color and discharge, no lesions, CERVIX: normal appearing cervix without discharge or lesions  Pap smear was collected  Procedure note Endometrial biopsy The cervix was cleansed with a Betadine swab.  The anterior lip of the cervix was grasped with single-tooth tenaculum.  The endometrial Pipelle sampler was easily introduced into the endometrial cavity and sound length measurement was taken of 6 cm.  The plunger was withdrawn causing suction in the Pipelle and the device was moved about the cavity for about 10 sec.  The Pipelle was removed then the sample was emptied in a specimen cup. The specimen contents were collected and sent to pathology for analysis.  Tenaculum was removed from the cervix and it was hemostatic.  The patient tolerated the procedure well without any complication.  [name] was  present for the examination and procedure   Pelvic ultrasound Small anteverted uterus, 5.7 x 4.1 x 3.3 cm Several small intramural and subserosal fibroids, the largest of which measures 1.5 cm. Irregular endometrium 3.5 mm thickness.  Small echogenic mass suspicious for polyp measuring 9 x 6 mm with internal blood flow. Right ovary with 9 mm simple follicle Left ovary with cystic mass 5.1 x 4.3 x 3.3 cm, 3 mm nodule noted along the periphery of the mass with positive blood flow. Moderate free fluid noted in the posterior cul-de-sac measuring 2.2 cm.  Assessment 64 yo G2P2 senting with a complex left ovarian cyst, endometrial mass likely consistent with polyp, and here for repeat Pap smear examination.  Plan I discussed the ultrasound findings with the patient.  I let her know that there is some concern due to this finding of a solid nodule in the wall of the left ovarian cyst that has blood flow to it.  Due to this finding and also the finding of an endometrial mass likely consistent with a polyp, I did perform an endometrial biopsy today to provide additional information.  She was not having any vaginal bleeding.  Her Pap smear was collected again today.  We discussed that the left ovarian cyst finding could be concerning for a potential sign of malignancy, however, her recent CA-125 level was 7, well within normal range, and this value has been stable over the past 2 years.  I do note that she had a mildly elevated CA 19-9 of 44 back on 01/14/2018.  We discussed that the only  way to know for certain the nature of the cyst is to have it surgically excised, which I would recommend.  However, due to the concern for complex findings within the cyst, I recommended referral to gynecologic oncology for further evaluation and management.   I reminded her that we do not know that the cyst is necessarily anything malignant at this point but we do need to do further investigation.  She does not have any  further questions at this time.  I will have staff contact her to set up the referral.  Joseph Pierini MD 10/15/19

## 2019-10-16 ENCOUNTER — Telehealth: Payer: Self-pay | Admitting: *Deleted

## 2019-10-16 LAB — PAP IG W/ RFLX HPV ASCU

## 2019-10-16 NOTE — Telephone Encounter (Signed)
Patient called back and Dr Denman George is under her insurance. Appt made for 3/2. Gave the patient the address and phone number. Also gave the policy for masks, visitors and parking

## 2019-10-16 NOTE — Telephone Encounter (Signed)
Staff message sent to Gyn-oncology at the [facility] ,they will contact patient to schedule.

## 2019-10-16 NOTE — Telephone Encounter (Signed)
Called and spoke with the patient regarding her referral. Patient had questions regarding her insurance and coverage. She will contact her insurance company and call the office back. Explained that I would contact the person that does the insurance authorization also.

## 2019-10-16 NOTE — Telephone Encounter (Signed)
-----   Message from Joseph Pierini, MD sent at 10/15/2019  1:23 PM EST ----- Regarding: gyn oncology referral Hi, I would like Monica Grant to be seen by gyn oncology for the left ovarian cyst if we could arrange that.  Thank you!

## 2019-10-16 NOTE — Telephone Encounter (Signed)
Patient scheduled with Dr Denman George 3/2 at 11:30 at [facility].

## 2019-10-20 ENCOUNTER — Inpatient Hospital Stay: Payer: No Typology Code available for payment source | Attending: Gynecologic Oncology | Admitting: Gynecologic Oncology

## 2019-10-20 ENCOUNTER — Other Ambulatory Visit: Payer: Self-pay

## 2019-10-20 ENCOUNTER — Encounter: Payer: Self-pay | Admitting: Gynecologic Oncology

## 2019-10-20 VITALS — BP 148/88 | HR 97 | Temp 97.8°F | Resp 18 | Ht 62.5 in | Wt 101.4 lb

## 2019-10-20 DIAGNOSIS — I10 Essential (primary) hypertension: Secondary | ICD-10-CM | POA: Insufficient documentation

## 2019-10-20 DIAGNOSIS — R978 Other abnormal tumor markers: Secondary | ICD-10-CM | POA: Diagnosis not present

## 2019-10-20 DIAGNOSIS — E7800 Pure hypercholesterolemia, unspecified: Secondary | ICD-10-CM | POA: Insufficient documentation

## 2019-10-20 DIAGNOSIS — Z79899 Other long term (current) drug therapy: Secondary | ICD-10-CM | POA: Diagnosis not present

## 2019-10-20 DIAGNOSIS — N83202 Unspecified ovarian cyst, left side: Secondary | ICD-10-CM | POA: Diagnosis not present

## 2019-10-20 DIAGNOSIS — Z8041 Family history of malignant neoplasm of ovary: Secondary | ICD-10-CM | POA: Diagnosis not present

## 2019-10-20 DIAGNOSIS — Z87891 Personal history of nicotine dependence: Secondary | ICD-10-CM | POA: Diagnosis not present

## 2019-10-20 DIAGNOSIS — E785 Hyperlipidemia, unspecified: Secondary | ICD-10-CM | POA: Insufficient documentation

## 2019-10-20 DIAGNOSIS — Z800 Family history of malignant neoplasm of digestive organs: Secondary | ICD-10-CM | POA: Diagnosis not present

## 2019-10-20 NOTE — Patient Instructions (Signed)
Dr Denman George is offering either close follow-up with ultrasounds every 6 months until you have monitored the cyst for 5 years with no significant change. Or alternatively you could proceed with surgery to remove both tubes and ovaries and test the left ovary for cancer (with staging biopsies if a cancer is found). This could be performed through 4 tiny incisions on the abdomen as an outpatient procedure. The uterus could be removed ("hysterectomy"), or left in place at your discretion (provided there is no cancer in the ovary).   Dr Serita Grit office can be reached at [phone] once you have determined how you would like to proceed.

## 2019-10-21 ENCOUNTER — Encounter: Payer: Self-pay | Admitting: Gynecologic Oncology

## 2019-10-21 DIAGNOSIS — N83202 Unspecified ovarian cyst, left side: Secondary | ICD-10-CM | POA: Insufficient documentation

## 2019-10-21 NOTE — Progress Notes (Signed)
Consult Note: Gyn-Onc  Consult was requested by Dr. Joseph Pierini for the evaluation of Monica Grant 64 y.o. female  CC:  Chief Complaint  Patient presents with  . Ovarian cyst, left    New patient    Assessment/Plan:  [name]  is a 64 y.o.  year old with a left sided mildly complex ovarian cyst that has been present and stable since approximately 2017.  It is associated with a normal Ca1 25 tumor marker.  I reviewed the images from the transvaginal ultrasound scan.  I reviewed her records including her recent CT scan of the abdomen and pelvis from January 2021.  I explained to the patient that I had overall low suspicion for malignancy.  I explained that determination of malignancy could only definitively be performed with surgical excision.  I discussed 2 options were available to her and reasonable at this time. 1/serial imaging with repeat ultrasound scans at 6 monthly intervals until 5 years of stable follow-up has been ascertained.  At that time he can be felt with confidence that this is nonmalignant cyst.  I explained that malignancy does not occur from transformation within the benign cystic lesion.  I explained that this approach would avoid surgical risk, however would involve some persistent prolonged follow-up. 2/proceeding with surgical intervention with robotic assisted BSO and possible hysterectomy.  I explained that frozen section will be performed of the left tube and ovary to malignancy was identified staging procedure to be performed.  I explained that hysterectomy could be added somewhat electively to the BSO if malignancy was not identified in the left ovary.  I explained that this option carries with some surgical risk however in this patient with a normal BMI and otherwise fairly healthy past medical history feel that that surgical risk is reasonable.  I would recommend BSO rather than USO at the time of surgery given her family history of a first-degree relative  with ovarian cancer.  After considering these 2 options the patient is unclear how she would like to proceed at this time.  She will continue to contemplate her options further and notify us if she desires proceeding with surgery.  If she elects for serial surveillance, she will return to Dr. Delilah Shan for repeat ultrasound imaging and Ca1 25.   HPI: [name] is a very pleasant 64 year old P2 who was seen in consultation at the request of Dr. Joseph Pierini for evaluation of a complex left ovarian cystic mass in the setting of a normal Ca1 25 tumor marker.  The patient has known about a left ovarian cyst since imaging in 2017 for kidney stone identified.  It was monitored in [place] where she lived previously with serial ultrasounds and remained stable.  [place] to have surveillance.  An ultrasound scan performed on Fabry 25th 2021 revealed a small anteverted uterus measuring 5.7 x 4.1 x 3.3 cm.  There was a small echogenic mass suspicious for a polyp measuring 9 x 6 mm with internal blood flow.  The right ovary contained a 9 mm simple follicle.  The left ovary contained a cystic mass measuring 5.1 x 4.3 x 3.3 cm with a 3 mm nodule noted along the periphery with positive blood flow.  There was moderate free fluid noted in the posterior cul-de-sac measuring 2.2 cm.  The patient had previously undergone a CT scan of the abdomen and pelvis on September 03, 2019 at [facility] to evaluate and rule out  kidney stone.  It was a noncontrasted scan.  There were no stones or hydronephrosis identified.  There is no retroperitoneal or pelvic adenopathy.  There was no ascites or carcinomatosis noted on scan report.  There was an oval fluid collection to the left of the uterus measuring 4.6 cm and could represent either loculated fluid or adnexal cyst.  There is a small associated calcification in its posterior wall.  To the right of the uterus there was a more elliptical and  probably free fluid collection.  The patient denied vaginal bleeding.  She has a past medical history significant for hypertension, hypercholesterolemia. Her surgical history is unremarkable she has never had surgery.  She had 2 prior vaginal births.  Her family history significant for mother with ovarian cancer stage Ib at age 3.  She declined chemotherapy.  She is alive.  She has a maternal aunt who died of cancer which was possibly thyroid cancer in a maternal uncle who also had a cancer of unclear diagnosis. Her father had a diagnosis of pancreatic cancer.  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  She has CA 19-9 tumor markers observed and there has been a history of some mild elevation of these albeit stable.  Her most recent 61 was 38 on April 11, 2018.  The preceding value had been 44 on Jan 14, 2018.  She sees low-power GI for follow-up of this.  The patient is retired and enjoys gardening.  She lives with her husband who is of good health.  Current Meds:  Outpatient Encounter Medications as of 10/20/2019  Medication Sig  . amLODipine (NORVASC) 2.5 MG tablet Take 1 tablet (2.5 mg total) by mouth daily.  Marland Kitchen atorvastatin (LIPITOR) 10 MG tablet Take 1 tablet (10 mg total) by mouth daily.  . Cholecalciferol (VITAMIN D3) 2000 units TABS Take 1 tablet by mouth daily.   No facility-administered encounter medications on file as of 10/20/2019.    Allergy:  Allergies  Allergen Reactions  . Aspirin Other (See Comments)    GI upset  . Codeine Other (See Comments)    GI upset    Social Hx:   Social History   Socioeconomic History  . Marital status: Married    Spouse name: Not on file  . Number of children: 2  . Years of education: Not on file  . Highest education level: Not on file  Occupational History  . Not on file  Tobacco Use  . Smoking status: Former Smoker    Types: Cigarettes    Quit date: 04/04/1987    Years  since quitting: 32.5  . Smokeless tobacco: Never Used  Substance and Sexual Activity  . Alcohol use: Yes    Comment: rare  . Drug use: No  . Sexual activity: Yes    Partners: Male    Comment: 1st intercourse 28 yo-1 partner  Other Topics Concern  . Not on file  Social History Narrative  . Not on file   Social Determinants of Health   Financial Resource Strain:   . Difficulty of Paying Living Expenses: Not on file  Food Insecurity:   . Worried About Charity fundraiser in the Last Year: Not on file  . Ran Out of Food in the Last Year: Not on file  Transportation Needs:   . Lack of Transportation (Medical): Not on file  . Lack of Transportation (Non-Medical): Not on file  Physical Activity:   .  Days of Exercise per Week: Not on file  . Minutes of Exercise per Session: Not on file  Stress:   . Feeling of Stress : Not on file  Social Connections:   . Frequency of Communication with Friends and Family: Not on file  . Frequency of Social Gatherings with Friends and Family: Not on file  . Attends Religious Services: Not on file  . Active Member of Clubs or Organizations: Not on file  . Attends Archivist Meetings: Not on file  . Marital Status: Not on file  Intimate Partner Violence:   . Fear of Current or Ex-Partner: Not on file  . Emotionally Abused: Not on file  . Physically Abused: Not on file  . Sexually Abused: Not on file    Past Surgical Hx:  Past Surgical History:  Procedure Laterality Date  . CARDIAC CATHETERIZATION      Past Medical Hx:  Past Medical History:  Diagnosis Date  . Elevated CA 19-9 level   . Hyperlipidemia   . Hypertension   . Kidney stones   . Pancreatic cyst     Past Gynecological History:  See HPI No LMP recorded. Patient is postmenopausal.  Family Hx:  Family History  Problem Relation Age of Onset  . Pancreatic cancer Father 110       pancreatic  . Cancer Sister        ? type  . COPD Sister   . Heart disease Sister    . Mental illness Sister   . Heart disease Brother 17       MI  . Heart disease Paternal Grandfather   . Ovarian cancer Mother   . Lymphoma Mother   . Heart disease Sister   . Stomach cancer Neg Hx   . Throat cancer Neg Hx     Review of Systems:  Constitutional  Feels well,    ENT Normal appearing ears and nares bilaterally Skin/Breast  No rash, sores, jaundice, itching, dryness Cardiovascular  No chest pain, shortness of breath, or edema  Pulmonary  No cough or wheeze.  Gastro Intestinal  No nausea, vomitting, or diarrhoea. No bright red blood per rectum, no abdominal pain, change in bowel movement, or constipation.  Genito Urinary  No frequency, urgency, dysuria, no bleeding, no pelvic pain Musculo Skeletal  No myalgia, arthralgia, joint swelling or pain  Neurologic  No weakness, numbness, change in gait,  Psychology  No depression, anxiety, insomnia.   Vitals:  Blood pressure (!) 148/88, pulse 97, temperature 97.8 F (36.6 C), temperature source Temporal, resp. rate 18, height 5' 2.5" (1.588 m), weight 101 lb 6.4 oz (46 kg), SpO2 100 %.  Physical Exam: WD in NAD Neck  Supple NROM, without any enlargements.  Lymph Node Survey No cervical supraclavicular or inguinal adenopathy Cardiovascular  Pulse normal rate, regularity and rhythm. S1 and S2 normal.  Lungs  Clear to auscultation bilateraly, without wheezes/crackles/rhonchi. Good air movement.  Skin  No rash/lesions/breakdown  Psychiatry  Alert and oriented to person, place, and time  Abdomen  Normoactive bowel sounds, abdomen soft, non-tender and thin without evidence of hernia.  Back No CVA tenderness Genito Urinary  Vulva/vagina: Normal external female genitalia.  No lesions. No discharge or bleeding.  Bladder/urethra:  No lesions or masses, well supported bladder  Vagina: normal  Cervix: Normal appearing, no lesions.  Uterus:  Small, mobile, no parametrial involvement or nodularity.  Adnexa: no  palpable masses. Rectal  deferred.  Extremities  No bilateral cyanosis, clubbing or  edema.   Thereasa Solo, MD  10/21/2019, 8:11 AM

## 2019-11-03 ENCOUNTER — Telehealth: Payer: Self-pay | Admitting: *Deleted

## 2019-11-03 ENCOUNTER — Encounter: Payer: Self-pay | Admitting: Obstetrics and Gynecology

## 2019-11-03 DIAGNOSIS — N83202 Unspecified ovarian cyst, left side: Secondary | ICD-10-CM

## 2019-11-03 NOTE — Telephone Encounter (Signed)
Patient called and stated "I saw Dr Denman George and have decided to go with the [place] scan every six months. Do I need to do them there with her or can I go back to Dr Delilah Shan." Explained that "she can go back to Dr Delilah Shan if she wishes, it would probably be cheaper fee for her." Patient to call Dr Scarlette Ar office to follow up

## 2019-11-03 NOTE — Telephone Encounter (Signed)
I have ordered a pelvic US for 6 month follow up. Please help the patient schedule this.

## 2019-11-06 ENCOUNTER — Ambulatory Visit: Payer: No Typology Code available for payment source | Attending: Internal Medicine

## 2019-11-06 DIAGNOSIS — Z23 Encounter for immunization: Secondary | ICD-10-CM

## 2019-11-06 NOTE — Progress Notes (Signed)
   Covid-19 Vaccination Clinic  Name:  Monica Grant    MRN: [id number] DOB: [birth year]  11/06/2019  Monica Grant was observed post Covid-19 immunization for 15 minutes without incident. She was provided with Vaccine Information Sheet and instruction to access the V-Safe system.   Monica Grant was instructed to call 911 with any severe reactions post vaccine: Marland Kitchen Difficulty breathing  . Swelling of face and throat  . A fast heartbeat  . A bad rash all over body  . Dizziness and weakness   Immunizations Administered    Name Date Dose VIS Date Route   Pfizer COVID-19 Vaccine 11/06/2019  1:02 PM 0.3 mL 07/31/2019 Intramuscular   Manufacturer: [place]   Lot: [id number]   [place]: [id number]

## 2019-12-01 ENCOUNTER — Ambulatory Visit: Payer: No Typology Code available for payment source | Attending: Internal Medicine

## 2019-12-01 DIAGNOSIS — Z23 Encounter for immunization: Secondary | ICD-10-CM

## 2019-12-01 NOTE — Progress Notes (Signed)
   Covid-19 Vaccination Clinic  Name:  Monica Grant    MRN: [id number] DOB: June 21, 1956  12/01/2019  [name] was observed post Covid-19 immunization for 15 minutes without incident. She was provided with Vaccine Information Sheet and instruction to access the V-Safe system.   [name] was instructed to call 911 with any severe reactions post vaccine: Marland Kitchen Difficulty breathing  . Swelling of face and throat  . A fast heartbeat  . A bad rash all over body  . Dizziness and weakness   Immunizations Administered    Name Date Dose VIS Date Route   Pfizer COVID-19 Vaccine 12/01/2019  1:47 PM 0.3 mL 07/31/2019 Intramuscular   Manufacturer: [place]   Lot: [id number]   [place]: [id number]

## 2019-12-30 ENCOUNTER — Other Ambulatory Visit: Payer: Self-pay | Admitting: *Deleted

## 2019-12-30 ENCOUNTER — Encounter: Payer: Self-pay | Admitting: Family Medicine

## 2019-12-30 DIAGNOSIS — I10 Essential (primary) hypertension: Secondary | ICD-10-CM

## 2019-12-30 DIAGNOSIS — E785 Hyperlipidemia, unspecified: Secondary | ICD-10-CM

## 2019-12-30 MED ORDER — ATORVASTATIN CALCIUM 10 MG PO TABS: 10.0000 mg | ORAL_TABLET | Freq: Every day | ORAL | 2 refills | Status: DC

## 2019-12-30 MED ORDER — AMLODIPINE BESYLATE 2.5 MG PO TABS: 2.5000 mg | ORAL_TABLET | Freq: Every day | ORAL | 2 refills | Status: DC

## 2020-01-26 ENCOUNTER — Other Ambulatory Visit: Payer: Self-pay

## 2020-01-26 ENCOUNTER — Ambulatory Visit (INDEPENDENT_AMBULATORY_CARE_PROVIDER_SITE_OTHER): Payer: No Typology Code available for payment source | Admitting: Cardiology

## 2020-01-26 ENCOUNTER — Encounter: Payer: Self-pay | Admitting: Cardiology

## 2020-01-26 VITALS — BP 138/88 | HR 73 | Ht 62.0 in | Wt 103.0 lb

## 2020-01-26 DIAGNOSIS — Z8249 Family history of ischemic heart disease and other diseases of the circulatory system: Secondary | ICD-10-CM

## 2020-01-26 DIAGNOSIS — I10 Essential (primary) hypertension: Secondary | ICD-10-CM

## 2020-01-26 DIAGNOSIS — R002 Palpitations: Secondary | ICD-10-CM

## 2020-01-26 DIAGNOSIS — Z7189 Other specified counseling: Secondary | ICD-10-CM

## 2020-01-26 NOTE — Progress Notes (Signed)
Cardiology Office Note:    Date:  01/26/2020   ID:  Monica Grant, DOB 1956/02/06, MRN [id number]  PCP:  Martin[place], Betty G, MD  Cardiologist:  Buford Dresser, MD PhD  Referring MD: Martinique, Betty G, MD   CC: palpitations, chest pain  History of Present Illness:    Monica Grant is a 64 y.o. female with a hx of hypertension, hyperlipidemia who is seen for follow up today. I initially met her 05/08/18 as a new consult at the request of [place], Malka So, MD for the evaluation and management of palpitations.  -Family history: brother died of MI at age 68 (smoker, poor health). Sister died of COPD (smoker, drug use). Another sister had cancer and had heart issues, had a 3V CABG (tobacco, alcohol use). All with poor diets. Two other sisters in good health.  Today: Has been "hibernating" in the cooler weather, getting back into her walking routine. Walks 1.5 -2 miles every morning over hilly terrain. Discussed goal of 150 minutes/week, which she is doing.   Having rare palpitations, more of a skipped beat. Very brief and only occasional.   Has stayed safe from Covid. Reading her bible/doing her devotions has helped her cope with her anxiety.   Checks blood pressure intermittently at home. Runs 110s-120s at home. Hoping to potentially come off of amlodipine in the future.   Discussed aortic atherosclerosis. Great response to atorvastatin since starting several years ago.  Denies chest pain, shortness of breath at rest or with normal exertion. No PND, orthopnea, LE edema or unexpected weight gain. No syncope.   Past Medical History:  Diagnosis Date  . Elevated CA 19-9 level   . Hyperlipidemia   . Hypertension   . Kidney stones   . Pancreatic cyst     Past Surgical History:  Procedure Laterality Date  . CARDIAC CATHETERIZATION      Current Medications: Current Outpatient Medications on File Prior to Visit  Medication Sig  . amLODipine (NORVASC) 2.5 MG tablet Take 1 tablet (2.5  mg total) by mouth daily.  Marland Kitchen atorvastatin (LIPITOR) 10 MG tablet Take 1 tablet (10 mg total) by mouth daily.  . Cholecalciferol (VITAMIN D3) 2000 units TABS Take 1 tablet by mouth daily.   No current facility-administered medications on file prior to visit.     Allergies:   Aspirin and Codeine   Social History   Tobacco Use  . Smoking status: Former Smoker    Types: Cigarettes    Quit date: 04/04/1987    Years since quitting: 33.0  . Smokeless tobacco: Never Used  Vaping Use  . Vaping Use: Never used  Substance Use Topics  . Alcohol use: Yes    Comment: rare  . Drug use: No    Family History: The patient's family history includes COPD in her sister; Cancer in her sister; Heart disease in her paternal grandfather, sister, and sister; Heart disease (age of onset: 27) in her brother; Lymphoma in her mother; Mental illness in her sister; Ovarian cancer in her mother; Pancreatic cancer (age of onset: 82) in her father. There is no history of Stomach cancer or Throat cancer. Mother living, age 51, has ovarian cancer and lymphoma, has HTN. Father passed age 53 from pancreatic cancer. Paternal Gma and Gpa had Mis, passed in their 32s and 80s, respectively. Brother had MI at age 12 and passed away.  ROS:   Please see the history of present illness.  Additional pertinent ROS otherwise unremarkable.  EKGs/Labs/Other Studies Reviewed:  The following studies were reviewed today: Monitor 06/27/18: 30 day event monitor resulted (BioTel monitor). There were approximately 140 patient triggered events recorded during this time. Patient symptoms included skipped beats, heart racing, shortness of breath, and chest pain. All episodes were sinus rhythm. Events were predominantly sinus rhythm, with occasional sinus rhythm with single PAC, single PVC, or rare couplets. The longest run of paroxysmal SVT was 8 beats at 119 bpm. No nonsustained VT seen. No pauses, sustained arrhythmias, or high degree AV  block documented.  Reports history of clean cardiac cath in 2017, do not have access to records. Echo 2017 normal per scanned report  EKG:  EKG is  ordered today.  The ekg ordered today demonstrates normal sinus rhythm, IVCD at 63 bpm  Recent Labs: 04/14/2019: ALT 15; BUN 12; Creat 0.66; Potassium 4.4; Sodium 142  Recent Lipid Panel    Component Value Date/Time   CHOL 176 04/14/2019 0838   TRIG 68 04/14/2019 0838   HDL 80 04/14/2019 0838   CHOLHDL 2.2 04/14/2019 0838   LDLCALC 82 04/14/2019 0838    Physical Exam:    VS:  BP (!) 157/78   Pulse 73   Ht '5\' 2"'  (1.575 m)   Wt 103 lb (46.7 kg)   SpO2 100%   BMI 18.84 kg/m     Wt Readings from Last 3 Encounters:  01/26/20 103 lb (46.7 kg)  10/20/19 101 lb 6.4 oz (46 kg)  09/24/19 101 lb (45.8 kg)    GEN: Well nourished, well developed in no acute distress HEENT: Normal, moist mucous membranes NECK: No JVD CARDIAC: regular rhythm, normal S1 and S2, no rubs or gallops. No murmur. VASCULAR: Radial and DP pulses 2+ bilaterally. No carotid bruits RESPIRATORY:  Clear to auscultation without rales, wheezing or rhonchi  ABDOMEN: Soft, non-tender, non-distended MUSCULOSKELETAL:  Ambulates independently SKIN: Warm and dry, no edema NEUROLOGIC:  Alert and oriented x 3. No focal neuro deficits noted. PSYCHIATRIC:  Normal affect   ASSESSMENT:    1. Palpitations   2. FHx: coronary artery disease   3. Hypertension, essential, benign   4. Counseling on health promotion and disease prevention   5. Cardiac risk counseling    PLAN:    Palpitations: now only rare -see prior monitor results  Hypertension: reports excellent control at home, elevated today -continue home BP checks. If elevated at home, will contact me -continue amlodipine  Primary prevention, with strong family history of heart disease: on atorvastatin 10 mg, already has an active lifestyle, reviewed recommendations for diet and risk factor control. The 10-year  ASCVD risk score Mikey Bussing DC Brooke Bonito., et al., 2013) is: 7.8%   Values used to calculate the score:     Age: 20 years     Sex: Female     Is Non-Hispanic African American: No     Diabetic: No     Tobacco smoker: No     Systolic Blood Pressure: 170 mmHg     Is BP treated: Yes     HDL Cholesterol: 80 mg/dL     Total Cholesterol: 176 mg/dL  CV risk counseling and prevention: -recommend heart healthy/Mediterranean diet, with whole grains, fruits, vegetable, fish, lean meats, nuts, and olive oil. Limit salt. -recommend moderate walking, 3-5 times/week for 30-50 minutes each session. Aim for at least 150 minutes.week. Goal should be pace of 3 miles/hours, or walking 1.5 miles in 30 minutes. She exceeds these targets already. -recommend avoidance of tobacco products. Avoid excess alcohol. She is already doing  both of these things.  Plan for follow up: 2 years or sooner as needed  Medication Adjustments/Labs and Tests Ordered: Current medicines are reviewed at length with the patient today.  Concerns regarding medicines are outlined above.  Orders Placed This Encounter  Procedures  . EKG 12-Lead   No orders of the defined types were placed in this encounter.   Patient Instructions  Medication Instructions:  Continue current medications  *If you need a refill on your cardiac medications before your next appointment, please call your pharmacy*   Lab Work: None ordered   Testing/Procedures: None ordered   Follow-Up: At [facility], you and your health needs are our priority.  As part of our continuing mission to provide you with exceptional heart care, we have created designated Provider Care Teams.  These Care Teams include your primary Cardiologist (physician) and Advanced Practice Providers (APPs -  Physician Assistants and Nurse Practitioners) who all work together to provide you with the care you need, when you need it.  We recommend signing up for the patient portal called  "MyChart".  Sign up information is provided on this After Visit Summary.  MyChart is used to connect with patients for Virtual Visits (Telemedicine).  Patients are able to view lab/test results, encounter notes, upcoming appointments, etc.  Non-urgent messages can be sent to your provider as well.   To learn more about what you can do with MyChart, go to [email or web address].    Your next appointment:   2 year(s)  The format for your next appointment:   In Person  Provider:   You may see Buford Dresser, MD or one of the following Advanced Practice Providers on your designated Care Team:    Rosaria Ferries, PA-C  Jory Sims, DNP, ANP  Cadence Kathlen Mody, NP       Signed, Buford Dresser, MD PhD 01/26/2020     [place]

## 2020-01-26 NOTE — Patient Instructions (Signed)
Medication Instructions:  Continue current medications  *If you need a refill on your cardiac medications before your next appointment, please call your pharmacy*   Lab Work: None ordered   Testing/Procedures: None ordered   Follow-Up: At [facility], you and your health needs are our priority.  As part of our continuing mission to provide you with exceptional heart care, we have created designated Provider Care Teams.  These Care Teams include your primary Cardiologist (physician) and Advanced Practice Providers (APPs -  Physician Assistants and Nurse Practitioners) who all work together to provide you with the care you need, when you need it.  We recommend signing up for the patient portal called "MyChart".  Sign up information is provided on this After Visit Summary.  MyChart is used to connect with patients for Virtual Visits (Telemedicine).  Patients are able to view lab/test results, encounter notes, upcoming appointments, etc.  Non-urgent messages can be sent to your provider as well.   To learn more about what you can do with MyChart, go to [email or web address].    Your next appointment:   2 year(s)  The format for your next appointment:   In Person  Provider:   You may see Buford Dresser, MD or one of the following Advanced Practice Providers on your designated Care Team:    Rosaria Ferries, PA-C  Jory Sims, DNP, ANP  Cadence Kathlen Mody, NP

## 2020-02-17 ENCOUNTER — Encounter: Payer: Self-pay | Admitting: Obstetrics and Gynecology

## 2020-02-18 NOTE — Telephone Encounter (Signed)
Is this something you would help with?

## 2020-04-15 ENCOUNTER — Encounter: Payer: Self-pay | Admitting: Cardiology

## 2020-04-27 ENCOUNTER — Encounter: Payer: Self-pay | Admitting: Obstetrics and Gynecology

## 2020-04-28 ENCOUNTER — Ambulatory Visit (INDEPENDENT_AMBULATORY_CARE_PROVIDER_SITE_OTHER): Payer: No Typology Code available for payment source | Admitting: Obstetrics and Gynecology

## 2020-04-28 ENCOUNTER — Other Ambulatory Visit: Payer: Self-pay

## 2020-04-28 ENCOUNTER — Ambulatory Visit (INDEPENDENT_AMBULATORY_CARE_PROVIDER_SITE_OTHER): Payer: No Typology Code available for payment source

## 2020-04-28 ENCOUNTER — Encounter: Payer: Self-pay | Admitting: Obstetrics and Gynecology

## 2020-04-28 VITALS — BP 128/80

## 2020-04-28 DIAGNOSIS — N83201 Unspecified ovarian cyst, right side: Secondary | ICD-10-CM

## 2020-04-28 DIAGNOSIS — N854 Malposition of uterus: Secondary | ICD-10-CM

## 2020-04-28 DIAGNOSIS — N83202 Unspecified ovarian cyst, left side: Secondary | ICD-10-CM

## 2020-04-28 NOTE — Progress Notes (Signed)
   Sharday Michl 1956-05-04 [id number]  SUBJECTIVE:  64 y.o. G2P2 female presents for surveillance of a left ovarian cyst that was incidentally discovered on CT 08/2019.  Mains asymptomatic.  CA-125 has been low level with a value of 7 on 09/24/2019.  She did see Dr. Denman George in consultation due to the cyst having a complex feature of an internal nodule, but overall the cyst is mostly just fluid-filled with a simple appearance.  They had discussed possibility of laparoscopic BSO and staging as indicated based on frozen pathology versus surveillance.  Patient has elected to continue with surveillance for the time being.  She denies any new symptoms such as drastic weight change, appetite change, nausea or vomiting, abdominal pain or pressure, any changes in bowel movements, or vaginal bleeding.  Current Outpatient Medications  Medication Sig Dispense Refill  . amLODipine (NORVASC) 2.5 MG tablet Take 1 tablet (2.5 mg total) by mouth daily. 90 tablet 2  . atorvastatin (LIPITOR) 10 MG tablet Take 1 tablet (10 mg total) by mouth daily. 90 tablet 2  . Cholecalciferol (VITAMIN D3) 2000 units TABS Take 1 tablet by mouth daily.     No current facility-administered medications for this visit.   Allergies: Aspirin and Codeine  No LMP recorded. Patient is postmenopausal.  Past medical history,surgical history, problem list, medications, allergies, family history and social history were all reviewed and documented as reviewed in the EPIC chart.  ROS: Pertinent positives and negatives as described in HPI    OBJECTIVE:  BP 128/80 (BP Location: Right Arm, Patient Position: Sitting, Cuff Size: Normal)  The patient appears well, alert, oriented x 3, in no distress. PELVIC EXAM: Deferred  Pelvic ultrasound Anteverted uterus 4.2 x 4.6 x 4.3 cm, multiple fibroids seen as previous ultrasound.  No change in size, largest measuring 1.4 cm maximum dimension. Endometrium 3.0 mm. Right ovary with small simple cyst 6 mm,  stable from previous ultrasound 10/15/2019.  Left ovary with stable thin-walled complex cyst 5 x 4 x 3 cm, inferior cyst wall nodule 3 mm as seen, no Doppler flow seen within the nodule on today's ultrasound.  Otherwise the cyst is mostly simple and fluid-filled. No other adnexal masses.  No free fluid.   ASSESSMENT:  64 y.o. G2P2 here for surveillance of left ovarian cyst with complexity  PLAN:  We discussed the cyst is stable with no apparent change.  CA-125 level was normal 7 months ago.  I would recommend that she continue to follow surveillance of the left ovarian cyst in 6 months with a repeat ultrasound and we can repeat the CA-125 at that time.  If all is stable then I would recommend considering decreasing the interval of surveillance to annual ultrasounds and CA-125 level.  She has a history of a mildly elevated CA 19-9 and follows with GI.  She is comfortable with this plan and will let us know in the interim if any new symptoms develop as described above.  Joseph Pierini MD 04/28/20

## 2020-04-29 ENCOUNTER — Encounter: Payer: Self-pay | Admitting: Obstetrics and Gynecology

## 2020-06-10 IMAGING — MR MR 3D RECON AT [facility]
19 of 23 series · 19 of 23 positions shown · IV contrast (gadavist)
Comparison: Outside MRI from [REDACTED] in [place] dated
12/02/2015

CLINICAL DATA: Followup cystic pancreatic lesion.

EXAM:
MRI ABDOMEN WITHOUT AND WITH CONTRAST (INCLUDING MRCP)
TECHNIQUE: Multiplanar multisequence MR imaging of the abdomen was performed
both before and after the administration of intravenous contrast.
Heavily T2-weighted images of the biliary and pancreatic ducts were
obtained, and three-dimensional MRCP images were rendered by post
processing.
CONTRAST:  4 cc Gadavist

[Series 3: T2 fat-sat · axial · 5.0mm · 0.86mm/px · 1 of 54 slices shown]
[im 1/54]
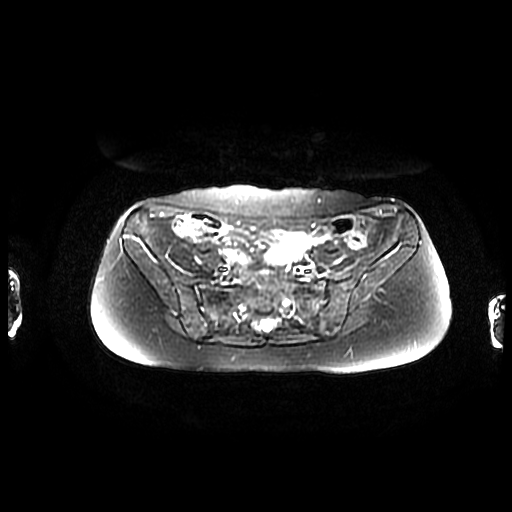

[Series 4: MRCP · coronal · 1.6mm · 0.62mm/px · 1 of 129 slices shown (1 of 2)]
[im 1/129]
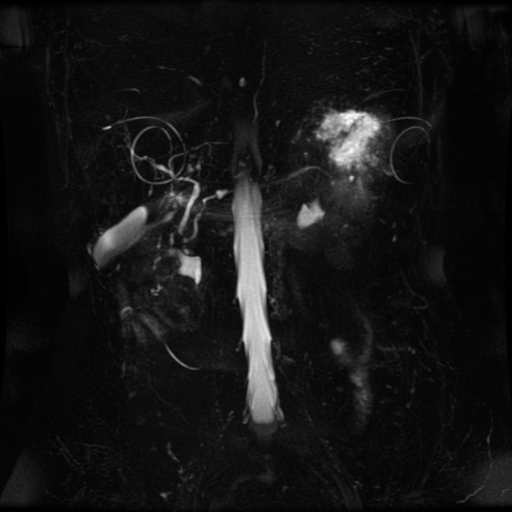

[Series 6: DWI b500 · axial · 5.0mm · 1.72mm/px · 1 of 90 slices shown]
[im 1/90]
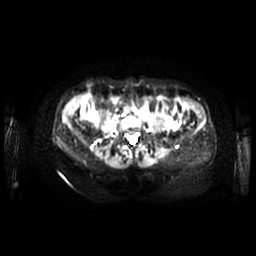

[Series 7: bSSFP fat-sat · coronal · 5.0mm · 0.70mm/px · 1 of 34 slices shown]
[im 1/34]
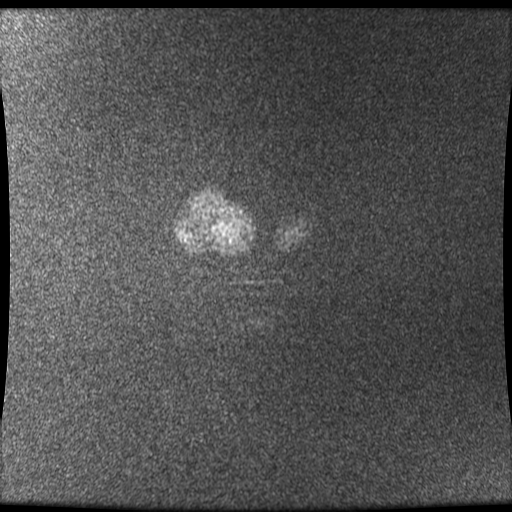

[Series 8: T2 · axial · 5.0mm · 0.86mm/px · 1 of 54 slices shown]
[im 1/54]
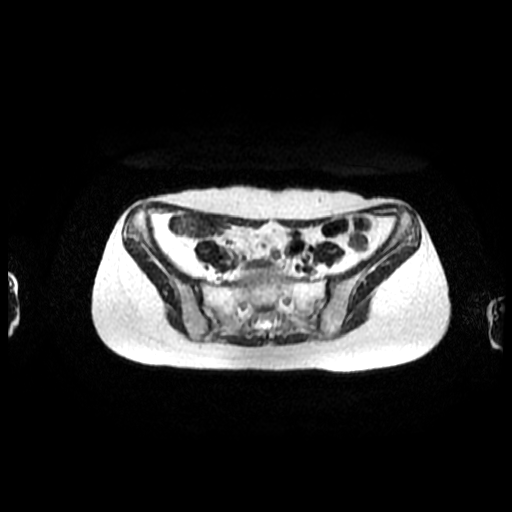

[Series 9: ax dualecho · axial · 5.0mm · 0.86mm/px · 1 of 108 slices shown]
[im 1/108]
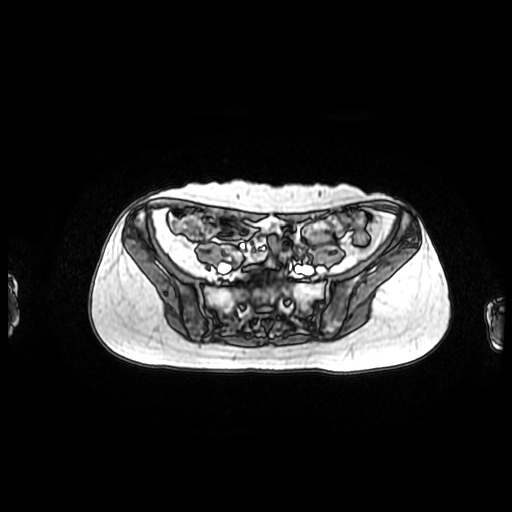

[Series 10: MRCP · coronal · 40.0mm · 0.70mm/px · 1 of 9 slices shown (2 of 2)]
[im 1/9]
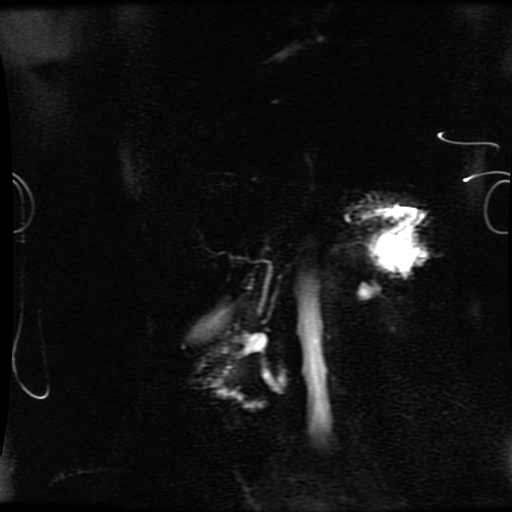

[Series 400: reformatted · axial · 1.6mm · 0.62mm/px · 1 of 91 slices shown (1 of 2)]
[im 1/91]
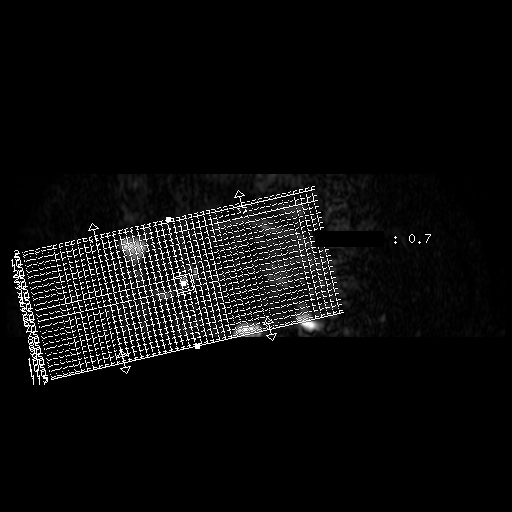

[Series 401: reformatted · axial · 1.6mm · 0.62mm/px · 1 of 85 slices shown (2 of 2)]
[im 1/85]
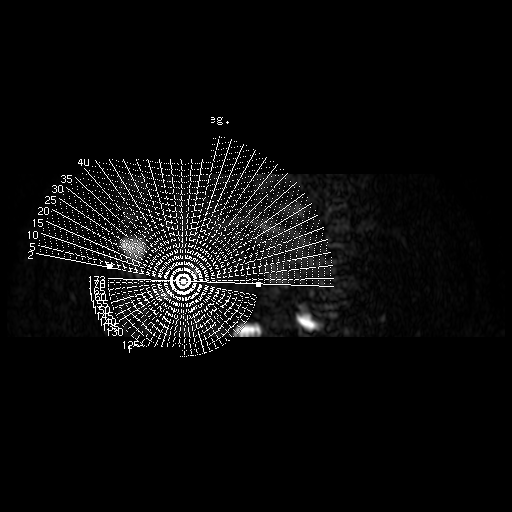

[Series 600: DWI · axial · 5.0mm · 1.72mm/px · 1 of 45 slices shown]
[im 1/45]
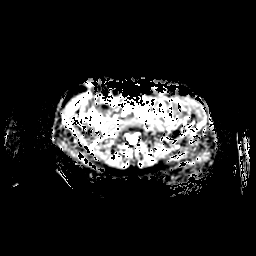

[Series 1100: T1 dynamic · axial · 5.0mm · 0.78mm/px · 1 of 92 slices shown (1 of 5)]
[im 1/92]
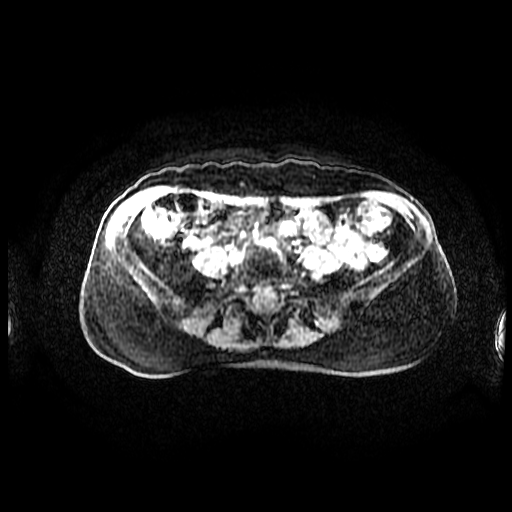

[Series 1101: T1 dynamic · axial · 5.0mm · 0.78mm/px · 1 of 92 slices shown (2 of 5)]
[im 1/92]
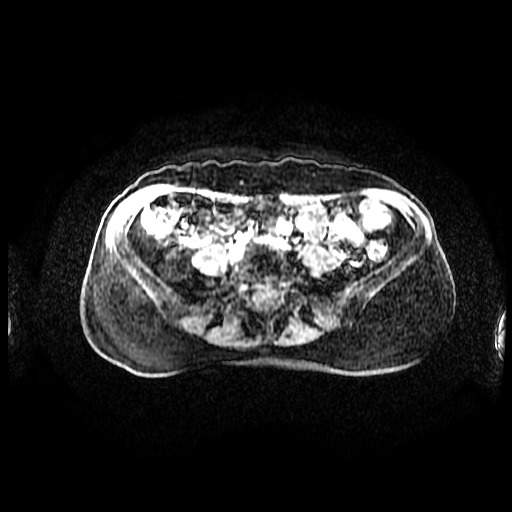

[Series 1103: T1 dynamic · axial · 5.0mm · 0.78mm/px · 1 of 92 slices shown (3 of 5)]
[im 1/92]
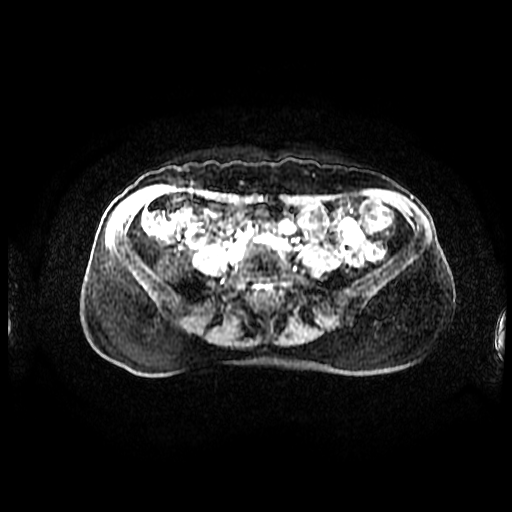

[Series 1104: T1 dynamic · axial · 5.0mm · 0.78mm/px · 1 of 92 slices shown (4 of 5)]
[im 1/92]
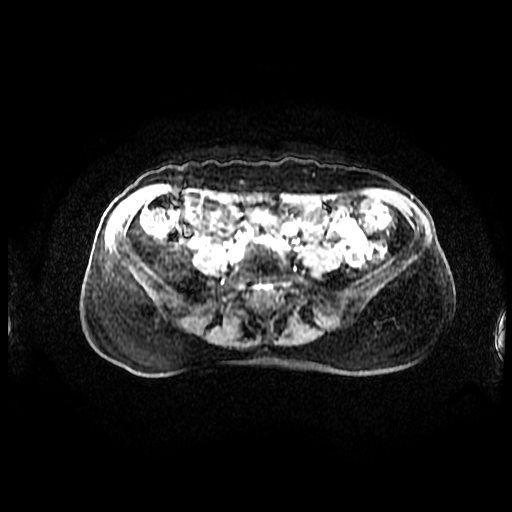

[Series 1105: T1 dynamic · axial · 5.0mm · 0.78mm/px · 1 of 92 slices shown (5 of 5)]
[im 1/92]
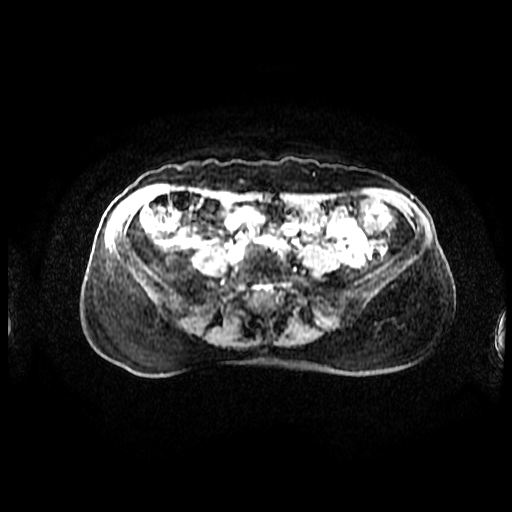

[((id)/(id)/1)-((id)/(id)/1) · axial · 5.0mm · 0.78mm/px · 1 of 92 slices shown (1 of 4)]
[im 1/92]
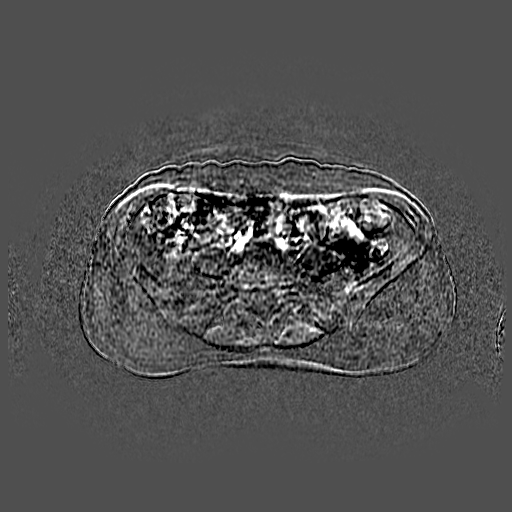

[((id)/(id)/1)-((id)/(id)/1) · axial · 5.0mm · 0.78mm/px · 1 of 92 slices shown (2 of 4)]
[im 1/92]
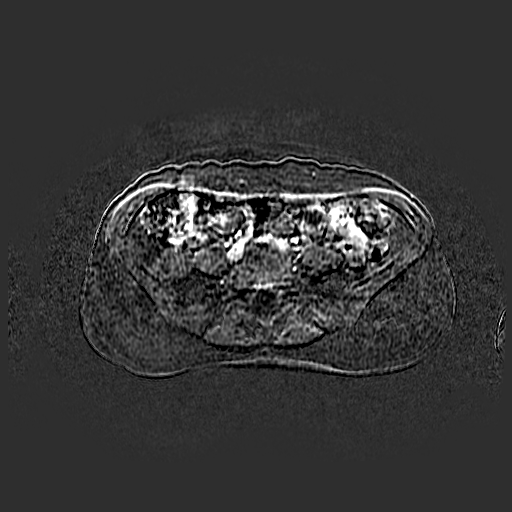

[((id)/(id)/1)-((id)/(id)/1) · axial · 5.0mm · 0.78mm/px · 1 of 92 slices shown (3 of 4)]
[im 1/92]
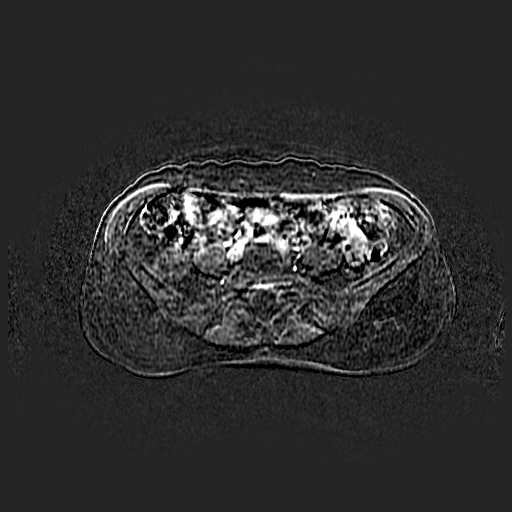

[((id)/(id)/1)-((id)/(id)/1) · axial · 5.0mm · 0.78mm/px · 1 of 92 slices shown (4 of 4)]
[im 1/92]
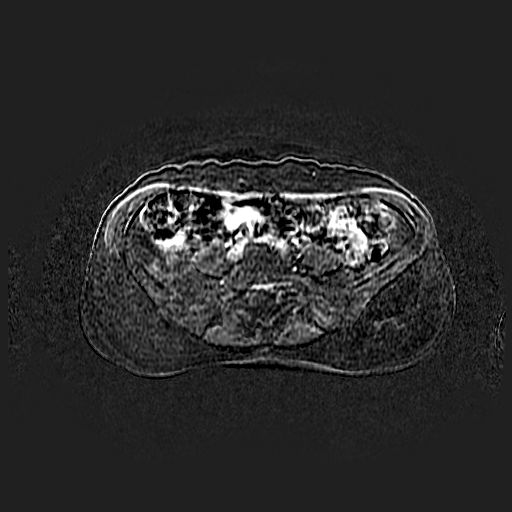

[19 of 23 positions shown; findings below may reference images not displayed]

FINDINGS: Lower chest: The lung bases are clear. No pleural or pericardial
effusion. No pulmonary lesions are identified.

Hepatobiliary: No focal hepatic lesions or intrahepatic biliary
dilatation. The gallbladder is normal. No common bile duct
dilatation. Normal caliber and course of the common bile duct.

Pancreas: Normal appearance of the main pancreatic duct. Normal
caliber and course. As demonstrated on the outside MRI examination
there is a small cystic lesion closely associated with the main
pancreatic duct. This measures a maximum of 6.0 x 4.5 mm. No
septations, nodularity or enhancement are identified. This is most
likely a small side-branch IPMN.

There is also a small cyst in the pancreatic tail measuring a
maximum of 4 mm. This appears stable also. It could be a small
postinflammatory cyst or a side-branch IPMN.

Spleen:  Normal size.  No focal lesions.

Adrenals/Urinary Tract:  The adrenal glands and kidneys are normal.

Stomach/Bowel: Visualized portions within the abdomen are
unremarkable.

Vascular/Lymphatic: No pathologically enlarged lymph nodes
identified. No abdominal aortic aneurysm demonstrated.

Other:  No ascites or abdominal wall hernia.

Musculoskeletal: No significant bony findings.
IMPRESSION: 1. Stable small cystic lesions in the pancreatic head and tail since
prior outside MRI from 9827. No worrisome MR imaging features. These
could be small postinflammatory cysts or side-branch IPMNs. Followup
MR examination in 2 years is suggested.
2. No acute abdominal findings or lymphadenopathy.

## 2020-07-04 DIAGNOSIS — M810 Age-related osteoporosis without current pathological fracture: Secondary | ICD-10-CM | POA: Insufficient documentation

## 2020-07-04 NOTE — Progress Notes (Signed)
HPI: [name] is a pleasant 64 y.o. female, who is here today for her routine physical and 12 months follow up. No new problems since her last visit. Last seen on 07/20/19. Last CPE: 04/14/19  Regular exercise 3 or more time per week: Walking a few times per week. Following a healthy diet: Yes, she cooks at home.  She lives with husband.  Chronic medical problems: HTN,HLD,pancreatic cyst, and osteoporosis.  Pap smear:01/12/20. Established with gyn.  Immunization History  Administered Date(s) Administered  . Influenza,inj,Quad PF,6+ Mos 07/05/2020  . PFIZER SARS-COV-2 Vaccination 11/06/2019, 12/01/2019  . Tdap 01/14/2018   Mammogram: 06/19/18 Colonoscopy: 01/2016 , 5 years follow up recommended. DEXA: 05/2018 osteoporosis left hip.She declined treatment. She is taking vit D supplementation. Hep C screening: 04/2016 NR.  She has no new concerns today.  HLD: She is on Atorvastatin 10 mg daily. Tolerating medication well.  Lab Results  Component Value Date   CHOL 176 04/14/2019   HDL 80 04/14/2019   LDLCALC 82 04/14/2019   TRIG 68 04/14/2019   CHOLHDL 2.2 04/14/2019   HTN: She is on Amlodipine 2.5 mg daily. Most BP readings 120's/70's. Occasionally SBP in lower 110's.  Lab Results  Component Value Date   CREATININE 0.66 04/14/2019   BUN 12 04/14/2019   NA 142 04/14/2019   K 4.4 04/14/2019   CL 105 04/14/2019   CO2 26 04/14/2019   Pancreatic cyst. She has had CA 19-9 slightly elevated. 04/11/18 38 (< 34) and 12/2017 44. She has not noted epigastric abdominal pain,N/V,or abnormal wt loss.  Review of Systems  Constitutional: Negative for appetite change, fatigue and fever.  HENT: Negative for hearing loss, mouth sores, sore throat, trouble swallowing and voice change.   Eyes: Negative for redness and visual disturbance.  Respiratory: Negative for cough, shortness of breath and wheezing.   Cardiovascular: Negative for chest pain and leg swelling.   Gastrointestinal: Negative for blood in stool.       No changes in bowel habits.  Endocrine: Negative for cold intolerance, heat intolerance, polydipsia, polyphagia and polyuria.  Genitourinary: Negative for decreased urine volume, dysuria, hematuria, vaginal bleeding and vaginal discharge.  Musculoskeletal: Negative for gait problem and myalgias.  Skin: Negative for color change and rash.  Allergic/Immunologic: Negative for environmental allergies.  Neurological: Negative for syncope, weakness and headaches.  Hematological: Negative for adenopathy. Does not bruise/bleed easily.  Psychiatric/Behavioral: Negative for confusion and sleep disturbance. The patient is not nervous/anxious.   All other systems reviewed and are negative.  Current Outpatient Medications on File Prior to Visit  Medication Sig Dispense Refill  . Cholecalciferol (VITAMIN D3) 2000 units TABS Take 1 tablet by mouth daily.     No current facility-administered medications on file prior to visit.   Past Medical History:  Diagnosis Date  . Elevated CA 19-9 level   . Hyperlipidemia   . Hypertension   . Kidney stones   . Pancreatic cyst     Past Surgical History:  Procedure Laterality Date  . CARDIAC CATHETERIZATION      Allergies  Allergen Reactions  . Aspirin Other (See Comments)    GI upset  . Codeine Other (See Comments)    GI upset    Family History  Problem Relation Age of Onset  . Pancreatic cancer Father 1       pancreatic  . Cancer Sister        ? type  . COPD Sister   . Heart disease Sister   .  Mental illness Sister   . Heart disease Brother 80       MI  . Heart disease Paternal Grandfather   . Ovarian cancer Mother   . Lymphoma Mother   . Heart disease Sister   . Stomach cancer Neg Hx   . Throat cancer Neg Hx     Social History   Socioeconomic History  . Marital status: Married    Spouse name: Not on file  . Number of children: 2  . Years of education: Not on file  .  Highest education level: Not on file  Occupational History  . Not on file  Tobacco Use  . Smoking status: Former Smoker    Types: Cigarettes    Quit date: 04/04/1987    Years since quitting: 33.2  . Smokeless tobacco: Never Used  Vaping Use  . Vaping Use: Never used  Substance and Sexual Activity  . Alcohol use: Yes    Comment: rare  . Drug use: No  . Sexual activity: Yes    Partners: Male    Comment: 1st intercourse 77 yo-1 partner  Other Topics Concern  . Not on file  Social History Narrative  . Not on file   Social Determinants of Health   Financial Resource Strain:   . Difficulty of Paying Living Expenses: Not on file  Food Insecurity:   . Worried About Charity fundraiser in the Last Year: Not on file  . Ran Out of Food in the Last Year: Not on file  Transportation Needs:   . Lack of Transportation (Medical): Not on file  . Lack of Transportation (Non-Medical): Not on file  Physical Activity:   . Days of Exercise per Week: Not on file  . Minutes of Exercise per Session: Not on file  Stress:   . Feeling of Stress : Not on file  Social Connections:   . Frequency of Communication with Friends and Family: Not on file  . Frequency of Social Gatherings with Friends and Family: Not on file  . Attends Religious Services: Not on file  . Active Member of Clubs or Organizations: Not on file  . Attends Archivist Meetings: Not on file  . Marital Status: Not on file   Vitals:   07/05/20 0722  BP: 124/70  Pulse: 86  Resp: 16  Temp: 97.6 F (36.4 C)  SpO2: 97%   Body mass index is 19.04 kg/m.   Wt Readings from Last 3 Encounters:  07/05/20 104 lb 2 oz (47.2 kg)  01/26/20 103 lb (46.7 kg)  10/20/19 101 lb 6.4 oz (46 kg)   Physical Exam Vitals and nursing note reviewed.  Constitutional:      General: She is not in acute distress.    Appearance: She is well-developed and normal weight.  HENT:     Head: Normocephalic and atraumatic.     Right Ear:  Hearing, tympanic membrane, ear canal and external ear normal.     Left Ear: Hearing, tympanic membrane, ear canal and external ear normal.     Mouth/Throat:     Mouth: Mucous membranes are moist.     Pharynx: Oropharynx is clear. Uvula midline.  Eyes:     Extraocular Movements: Extraocular movements intact.     Conjunctiva/sclera: Conjunctivae normal.     Pupils: Pupils are equal, round, and reactive to light.  Neck:     Thyroid: No thyromegaly.     Trachea: No tracheal deviation.  Cardiovascular:     Rate  and Rhythm: Normal rate and regular rhythm.     Pulses:          Dorsalis pedis pulses are 2+ on the right side and 2+ on the left side.     Heart sounds: No murmur heard.   Pulmonary:     Effort: Pulmonary effort is normal. No respiratory distress.     Breath sounds: Normal breath sounds.  Abdominal:     Palpations: Abdomen is soft. There is no hepatomegaly or mass.     Tenderness: There is no abdominal tenderness.  Genitourinary:    Comments: Deferred to gyn. Musculoskeletal:     Comments: No major deformity or signs of synovitis appreciated.  Lymphadenopathy:     Cervical: No cervical adenopathy.     Upper Body:     Right upper body: No supraclavicular adenopathy.     Left upper body: No supraclavicular adenopathy.  Skin:    General: Skin is warm.     Findings: No erythema or rash.  Neurological:     General: No focal deficit present.     Mental Status: She is alert and oriented to person, place, and time.     Cranial Nerves: No cranial nerve deficit.     Coordination: Coordination normal.     Gait: Gait normal.     Deep Tendon Reflexes:     Reflex Scores:      Bicep reflexes are 2+ on the right side and 2+ on the left side.      Patellar reflexes are 2+ on the right side and 2+ on the left side. Psychiatric:        Speech: Speech normal.     Comments: Well groomed, good eye contact.   ASSESSMENT AND PLAN:  Monica Grant was here today annual physical  examination and follow up.  Orders Placed This Encounter  Procedures  . Flu Vaccine QUAD 36+ mos IM  . Cancer Antigen 19-9  . COMPLETE METABOLIC PANEL WITH GFR  . Lipid panel   Lab Results  Component Value Date   CHOL 193 07/05/2020   HDL 87 07/05/2020   LDLCALC 89 07/05/2020   TRIG 77 07/05/2020   CHOLHDL 2.2 07/05/2020   Lab Results  Component Value Date   CREATININE 0.69 07/05/2020   BUN 16 07/05/2020   NA 139 07/05/2020   K 4.5 07/05/2020   CL 102 07/05/2020   CO2 29 07/05/2020   Lab Results  Component Value Date   ALT 16 07/05/2020   AST 20 07/05/2020   ALKPHOS 94 11/08/2015   BILITOT 0.8 07/05/2020   Routine general medical examination at a health care facility We discussed the importance of regular physical activity and healthy diet for prevention of chronic illness and/or complications. Preventive guidelines reviewed. Vaccination up to date. Continue female preventive care with gyn. Ca++ and vit D supplementation recommended. Next CPE in a year.  The 10-year ASCVD risk score Mikey Bussing DC Brooke Bonito., et al., 2013) is: 5%   Values used to calculate the score:     Age: 6 years     Sex: Female     Is Non-Hispanic African American: No     Diabetic: No     Tobacco smoker: No     Systolic Blood Pressure: 366 mmHg     Is BP treated: Yes     HDL Cholesterol: 87 mg/dL     Total Cholesterol: 193 mg/dL  Screening for endocrine, metabolic and immunity disorder -  COMPLETE METABOLIC PANEL WITH GFR  Need for influenza vaccination -     Flu Vaccine QUAD 36+ mos IM  Hypertension, essential, benign BP adequately controlled. Continue Amlodipine 2.5 mg daily and low salt diet. Continue monitoring BP regularly.  Hyperlipidemia Continue Atorvastatin 10 mg daily and low fat diet. Treatment will be adjusted if needed, according to FLP results.  Pancreatic cyst Asymptomatic. Will continue following annually with CA 19-9.  Osteoporosis left hip (-2.7) She prefers to  hold on DEXA for now until she finds out about insurance coverage. We discussed a few treatment options. Ca++ 1000-1200 mg and continue Vit D 1000 U daily. Fall prevention.   Return in 1 year (on 07/05/2021) for CPE and follow up.   Smiley Birr G. Martinique, MD  [facility]. [place] office.   Today you have you routine preventive visit and we follow on some chronic medical problems. A few things to remember from today's visit:   Routine general medical examination at a health care facility  Hypertension, essential, benign - Plan: amLODipine (NORVASC) 2.5 MG tablet  Hyperlipidemia, unspecified hyperlipidemia type - Plan: Lipid panel  Localized osteoporosis without current pathological fracture  Pancreatic cyst - Plan: Cancer Antigen 19-9  Screening for endocrine, metabolic and immunity disorder - Plan: COMPLETE METABOLIC PANEL WITH GFR  If you need refills please call your pharmacy. Do not use My Chart to request refills or for acute issues that need immediate attention.   Please be sure medication list is accurate. If a new problem present, please set up appointment sooner than planned today.  At least 150 minutes of moderate exercise per week, daily brisk walking for 15-30 min is a good exercise option. Healthy diet low in saturated (animal) fats and sweets and consisting of fresh fruits and vegetables, lean meats such as fish and white chicken and whole grains.  These are some of recommendations for screening depending of age and risk factors:  - Vaccines:  Tdap vaccine every 10 years.  Shingles vaccine recommended at age 70, could be given after 64 years of age but not sure about insurance coverage.   Pneumonia vaccines: Pneumovax at 75. Sometimes Pneumovax is giving earlier if history of smoking, lung disease,diabetes,kidney disease among some.  Screening for diabetes at age 42 and every 3 years.  Cervical cancer prevention:  Pap smear starts at 64 years of  age and continues periodically until 64 years old in low risk women. Pap smear every 3 years between 59 and 22 years old. Pap smear every 3-5 years between women 14 and older if pap smear negative and HPV screening negative.   -Breast cancer: Mammogram: There is disagreement between experts about when to start screening in low risk asymptomatic female but recent recommendations are to start screening at 68 and not later than 64 years old , every 1-2 years and after 64 yo q 2 years. Screening is recommended until 64 years old but some women can continue screening depending of healthy issues.  Colon cancer screening: Has been recently changed to 64 yo. Insurance may not cover until you are 64 years old. Screening is recommended until 64 years old.  Cholesterol disorder screening at age 42 and every 3 years.N/A  Also recommended:  1. Dental visit- Brush and floss your teeth twice daily; visit your dentist twice a year. 2. Eye doctor- Get an eye exam at least every 2 years. 3. Helmet use- Always wear a helmet when riding a bicycle, motorcycle, rollerblading or skateboarding. 4. Safe  sex- If you may be exposed to sexually transmitted infections, use a condom. 5. Seat belts- Seat belts can save your live; always wear one. 6. Smoke/Carbon Monoxide detectors- These detectors need to be installed on the appropriate level of your home. Replace batteries at least once a year. 7. Skin cancer- When out in the sun please cover up and use sunscreen 15 SPF or higher. 8. Violence- If anyone is threatening or hurting you, please tell your healthcare provider.  9. Drink alcohol in moderation- Limit alcohol intake to one drink or less per day. Never drink and drive. 10. Calcium supplementation 1000 to 1200 mg daily, ideally through your diet.  Vitamin D supplementation 800 units daily.

## 2020-07-05 ENCOUNTER — Telehealth: Payer: Self-pay | Admitting: *Deleted

## 2020-07-05 ENCOUNTER — Other Ambulatory Visit: Payer: Self-pay

## 2020-07-05 ENCOUNTER — Encounter: Payer: Self-pay | Admitting: Family Medicine

## 2020-07-05 ENCOUNTER — Ambulatory Visit (INDEPENDENT_AMBULATORY_CARE_PROVIDER_SITE_OTHER): Payer: No Typology Code available for payment source | Admitting: Family Medicine

## 2020-07-05 VITALS — BP 124/70 | HR 86 | Temp 97.6°F | Resp 16 | Ht 62.0 in | Wt 104.1 lb

## 2020-07-05 DIAGNOSIS — Z1329 Encounter for screening for other suspected endocrine disorder: Secondary | ICD-10-CM | POA: Diagnosis not present

## 2020-07-05 DIAGNOSIS — Z13228 Encounter for screening for other metabolic disorders: Secondary | ICD-10-CM | POA: Diagnosis not present

## 2020-07-05 DIAGNOSIS — Z23 Encounter for immunization: Secondary | ICD-10-CM

## 2020-07-05 DIAGNOSIS — I10 Essential (primary) hypertension: Secondary | ICD-10-CM

## 2020-07-05 DIAGNOSIS — E785 Hyperlipidemia, unspecified: Secondary | ICD-10-CM

## 2020-07-05 DIAGNOSIS — Z130 Encounter for screening for diseases of the blood and blood-forming organs and certain disorders involving the immune mechanism: Secondary | ICD-10-CM | POA: Diagnosis not present

## 2020-07-05 DIAGNOSIS — K862 Cyst of pancreas: Secondary | ICD-10-CM

## 2020-07-05 DIAGNOSIS — Z0000 Encounter for general adult medical examination without abnormal findings: Secondary | ICD-10-CM

## 2020-07-05 DIAGNOSIS — M816 Localized osteoporosis [Lequesne]: Secondary | ICD-10-CM | POA: Diagnosis not present

## 2020-07-05 MED ORDER — AMLODIPINE BESYLATE 2.5 MG PO TABS: 2.5000 mg | ORAL_TABLET | Freq: Every day | ORAL | 3 refills | Status: DC

## 2020-07-05 NOTE — Assessment & Plan Note (Signed)
Asymptomatic. Will continue following annually with CA 19-9.

## 2020-07-05 NOTE — Telephone Encounter (Signed)
Patient called and asked if order can be faxed to [facility] [phone]. Will have Dr.Kendall sign and fax order to fax # provided by the patient.

## 2020-07-05 NOTE — Assessment & Plan Note (Signed)
BP adequately controlled. Continue Amlodipine 2.5 mg daily and low salt diet. Continue monitoring BP regularly.

## 2020-07-05 NOTE — Assessment & Plan Note (Signed)
Continue Atorvastatin 10 mg daily and low fat diet. Treatment will be adjusted if needed, according to FLP results.

## 2020-07-05 NOTE — Assessment & Plan Note (Signed)
She prefers to hold on DEXA for now until she finds out about insurance coverage. We discussed a few treatment options. Ca++ 1000-1200 mg and continue Vit D 1000 U daily. Fall prevention.

## 2020-07-05 NOTE — Patient Instructions (Addendum)
Today you have you routine preventive visit and we follow on some chronic medical problems. A few things to remember from today's visit:   Routine general medical examination at a health care facility  Hypertension, essential, benign - Plan: amLODipine (NORVASC) 2.5 MG tablet  Hyperlipidemia, unspecified hyperlipidemia type - Plan: Lipid panel  Localized osteoporosis without current pathological fracture  Pancreatic cyst - Plan: Cancer Antigen 19-9  Screening for endocrine, metabolic and immunity disorder - Plan: COMPLETE METABOLIC PANEL WITH GFR  If you need refills please call your pharmacy. Do not use My Chart to request refills or for acute issues that need immediate attention.   Please be sure medication list is accurate. If a new problem present, please set up appointment sooner than planned today.  At least 150 minutes of moderate exercise per week, daily brisk walking for 15-30 min is a good exercise option. Healthy diet low in saturated (animal) fats and sweets and consisting of fresh fruits and vegetables, lean meats such as fish and white chicken and whole grains.  These are some of recommendations for screening depending of age and risk factors:  - Vaccines:  Tdap vaccine every 10 years.  Shingles vaccine recommended at age 72, could be given after 64 years of age but not sure about insurance coverage.   Pneumonia vaccines: Pneumovax at 51. Sometimes Pneumovax is giving earlier if history of smoking, lung disease,diabetes,kidney disease among some.  Screening for diabetes at age 51 and every 3 years.  Cervical cancer prevention:  Pap smear starts at 64 years of age and continues periodically until 64 years old in low risk women. Pap smear every 3 years between 41 and 38 years old. Pap smear every 3-5 years between women 67 and older if pap smear negative and HPV screening negative.   -Breast cancer: Mammogram: There is disagreement between experts about when to  start screening in low risk asymptomatic female but recent recommendations are to start screening at 3 and not later than 64 years old , every 1-2 years and after 64 yo q 2 years. Screening is recommended until 64 years old but some women can continue screening depending of healthy issues.  Colon cancer screening: Has been recently changed to 64 yo. Insurance may not cover until you are 64 years old. Screening is recommended until 64 years old.  Cholesterol disorder screening at age 28 and every 3 years.N/A  Also recommended:  1. Dental visit- Brush and floss your teeth twice daily; visit your dentist twice a year. 2. Eye doctor- Get an eye exam at least every 2 years. 3. Helmet use- Always wear a helmet when riding a bicycle, motorcycle, rollerblading or skateboarding. 4. Safe sex- If you may be exposed to sexually transmitted infections, use a condom. 5. Seat belts- Seat belts can save your live; always wear one. 6. Smoke/Carbon Monoxide detectors- These detectors need to be installed on the appropriate level of your home. Replace batteries at least once a year. 7. Skin cancer- When out in the sun please cover up and use sunscreen 15 SPF or higher. 8. Violence- If anyone is threatening or hurting you, please tell your healthcare provider.  9. Drink alcohol in moderation- Limit alcohol intake to one drink or less per day. Never drink and drive. 10. Calcium supplementation 1000 to 1200 mg daily, ideally through your diet.  Vitamin D supplementation 800 units daily.

## 2020-07-06 LAB — LIPID PANEL
Cholesterol: 193 mg/dL (ref <200)
HDL: 87 mg/dL (ref >=50)
LDL Cholesterol (Calc): 89 mg/dL (calc)
Non-HDL Cholesterol (Calc): 106 mg/dL (calc) (ref <130)
Total CHOL/HDL Ratio: 2.2 (calc) (ref <5.0)
Triglycerides: 77 mg/dL (ref <150)

## 2020-07-06 LAB — COMPLETE METABOLIC PANEL WITH GFR
AG Ratio: 2 (calc) (ref 1.0–2.5)
ALT: 16 U/L (ref 6–29)
AST: 20 U/L (ref 10–35)
Albumin: 5 g/dL (ref 3.6–5.1)
Alkaline phosphatase (APISO): 96 U/L (ref 37–153)
BUN: 16 mg/dL (ref 7–25)
CO2: 29 mmol/L (ref 20–32)
Calcium: 10.1 mg/dL (ref 8.6–10.4)
Chloride: 102 mmol/L (ref 98–110)
Creat: 0.69 mg/dL (ref 0.50–0.99)
GFR, Est African American: 107 mL/min/{1.73_m2} (ref >=60)
GFR, Est Non African American: 92 mL/min/{1.73_m2} (ref >=60)
Globulin: 2.5 g/dL (calc) (ref 1.9–3.7)
Glucose, Bld: 96 mg/dL (ref 65–99)
Potassium: 4.5 mmol/L (ref 3.5–5.3)
Sodium: 139 mmol/L (ref 135–146)
Total Bilirubin: 0.8 mg/dL (ref 0.2–1.2)
Total Protein: 7.5 g/dL (ref 6.1–8.1)

## 2020-07-06 LAB — CANCER ANTIGEN 19-9: CA 19-9: 26 U/mL (ref <34)

## 2020-07-09 MED ORDER — ATORVASTATIN CALCIUM 10 MG PO TABS: 10.0000 mg | ORAL_TABLET | Freq: Every day | ORAL | 3 refills | Status: DC

## 2020-07-11 NOTE — Telephone Encounter (Signed)
Patient informed, order faxed.

## 2020-07-27 ENCOUNTER — Other Ambulatory Visit (INDEPENDENT_AMBULATORY_CARE_PROVIDER_SITE_OTHER): Payer: No Typology Code available for payment source

## 2020-07-27 ENCOUNTER — Other Ambulatory Visit: Payer: Self-pay

## 2020-07-27 ENCOUNTER — Encounter: Payer: Self-pay | Admitting: Family Medicine

## 2020-07-27 ENCOUNTER — Telehealth (INDEPENDENT_AMBULATORY_CARE_PROVIDER_SITE_OTHER): Payer: No Typology Code available for payment source | Admitting: Family Medicine

## 2020-07-27 VITALS — Ht 62.0 in

## 2020-07-27 DIAGNOSIS — R110 Nausea: Secondary | ICD-10-CM | POA: Diagnosis not present

## 2020-07-27 DIAGNOSIS — R300 Dysuria: Secondary | ICD-10-CM

## 2020-07-27 DIAGNOSIS — N390 Urinary tract infection, site not specified: Secondary | ICD-10-CM

## 2020-07-27 MED ORDER — NITROFURANTOIN MONOHYD MACRO 100 MG PO CAPS: 100.0000 mg | ORAL_CAPSULE | Freq: Two times a day (BID) | ORAL | 0 refills | Status: AC

## 2020-07-27 NOTE — Progress Notes (Signed)
Virtual Visit via Telephone Note  I connected with Monica Grant on 07/27/20 at  5:00 PM EST by telephone and verified that I am speaking with the correct person using two identifiers.   I discussed the limitations, risks, security and privacy concerns of performing an evaluation and management service by telephone and the availability of in person appointments. I also discussed with the patient that there may be a patient responsible charge related to this service. The patient expressed understanding and agreed to proceed.  Location patient: home Location provider: work office Participants present for the call: patient, provider Patient did not have a visit in the prior 7 days to address this/these issue(s).  History of Present Illness: Monica Grant is a 64 yo female with hx of HTN,HLD,and pancreatic cyst c/o 2 days of "little" dysuria and "discomfort" with urination. Mild suprapubic pressure and "heavy" feeling sensation. No changes in bowel habits,nausea,vomiting, or vaginal bleeding/discharge.  Yesterday she noted odorous urine. Negative for fever,unusual fatigue,back pain, gross hematuria,decreased urine output,urgency,or frequency. She has had some chills but this is not unusual for her.  She is not sexually active. She has had UTI before. UCx in 06/2019 grew E. Coli 10,000-49,000 CFU.  She has not tried OTC mediations. Has increased water intake.   Occasionally she feels nauseated when she is ill or when taking abx. She has Zofran at home, asking if she can take it if she develops nausea.  Observations/Objective: Patient sounds cheerful and well on the phone. I do not appreciate any SOB. Speech and thought processing are grossly intact. Patient reported vitals:Ht 5\' 2"  (1.575 m)   BMI 19.04 kg/m   Assessment and Plan: 1. Dysuria We discussed possible etiologies. UA result is not back yet.  Continue adequate hydration.  2. Urinary tract infection without hematuria, site  unspecified Empiric treatment with Macrobid 500 mg bid x 5 days recommended. Ucx sent. Clearly instructed about warning signs.  3. Nausea without vomiting It is ok to continue Zofran 4 mg daily prn for nausea. Some side effects from abx discussed.  Follow Up Instructions:  Return if symptoms worsen or fail to improve.  I did not refer this patient for an OV in the next 24 hours for this/these issue(s).  I discussed the assess. She agreed with the plan and demonstrated an understanding of the instructions.  I provided 15 minutes of non-face-to-face time during this encounter.   Auston Halfmann Martinique, MD

## 2020-07-28 LAB — URINALYSIS, ROUTINE W REFLEX MICROSCOPIC
Bilirubin Urine: NEGATIVE
Glucose, UA: NEGATIVE
Hgb urine dipstick: NEGATIVE
Ketones, ur: NEGATIVE
Leukocytes,Ua: NEGATIVE
Nitrite: NEGATIVE
Protein, ur: NEGATIVE
Specific Gravity, Urine: 1.003 (ref 1.001–1.03)
pH: 7.5 (ref 5.0–8.0)

## 2020-07-28 LAB — URINE CULTURE
MICRO NUMBER:: 11292436
Result:: NO GROWTH
SPECIMEN QUALITY:: ADEQUATE

## 2020-07-29 ENCOUNTER — Encounter: Payer: Self-pay | Admitting: Gynecology

## 2020-08-01 ENCOUNTER — Encounter: Payer: Self-pay | Admitting: Obstetrics and Gynecology

## 2020-08-02 ENCOUNTER — Encounter: Payer: Self-pay | Admitting: Gynecology

## 2020-08-02 ENCOUNTER — Telehealth: Payer: Self-pay

## 2020-08-02 NOTE — Telephone Encounter (Addendum)
Patient e-mailed: "Hello Dr. Delilah Shan, It will be 2 weeks tomorrow since I've taken the above mentioned tests. I was wondering if you had a chance to review them. Kind regards, Monica Grant"  We had not received mammo result yet but I called [facility] and they are faxing it over. Onalee Hua has scanned it in now.)  BD test was just scanned in on Friday.  Please review and advise. Thanks

## 2020-08-03 ENCOUNTER — Encounter: Payer: Self-pay | Admitting: Obstetrics and Gynecology

## 2020-08-03 NOTE — Telephone Encounter (Signed)
I put in a message attached to her result yesterday

## 2020-08-03 NOTE — Telephone Encounter (Signed)
Thanks!  I thought I had closed encounter without you seeing it.

## 2020-08-23 ENCOUNTER — Encounter: Payer: Self-pay | Admitting: Obstetrics and Gynecology

## 2020-08-23 ENCOUNTER — Other Ambulatory Visit: Payer: Self-pay

## 2020-08-23 ENCOUNTER — Ambulatory Visit: Payer: No Typology Code available for payment source | Admitting: Obstetrics and Gynecology

## 2020-08-23 ENCOUNTER — Telehealth (INDEPENDENT_AMBULATORY_CARE_PROVIDER_SITE_OTHER): Payer: No Typology Code available for payment source | Admitting: Obstetrics and Gynecology

## 2020-08-23 DIAGNOSIS — M810 Age-related osteoporosis without current pathological fracture: Secondary | ICD-10-CM

## 2020-08-23 MED ORDER — ALENDRONATE SODIUM 70 MG PO TABS: 70.0000 mg | ORAL_TABLET | ORAL | 4 refills | Status: DC

## 2020-08-23 NOTE — Progress Notes (Addendum)
   Monica Grant [birth year] [id number]  SUBJECTIVE:  65 y.o. G2P2 female presents for a video encounter. This encounter took place during the COVID-19 public health emergency and patient consented to a video type encounter.  Patient identity is confirmed with name and date of birth. Patient is at home. Provider is located at the [facility] office.  She had a DEXA performed 07/19/2020 which showed a T score -3.1 at the right femoral neck indicating osteoporosis, and T score -2.4 in the lumbar spine.  She presents virtually today to discuss osteoporosis treatment that was recommended.   Current Outpatient Medications  Medication Sig Dispense Refill  . amLODipine (NORVASC) 2.5 MG tablet Take 1 tablet (2.5 mg total) by mouth daily. 90 tablet 3  . atorvastatin (LIPITOR) 10 MG tablet Take 1 tablet (10 mg total) by mouth daily. 90 tablet 3  . Cholecalciferol (VITAMIN D3) 2000 units TABS Take 1 tablet by mouth daily.     No current facility-administered medications for this visit.   Allergies: Aspirin and Codeine  No LMP recorded. Patient is postmenopausal.  Past medical history,surgical history, problem list, medications, allergies, family history and social history were all reviewed and documented as reviewed in the EPIC chart.   OBJECTIVE:  There were no vitals taken for this visit. The patient appears well, alert, oriented, in no distress. Exam limited given the remote nature of the encounter  ASSESSMENT:  65 y.o. G2P2 present to discuss osteoporosis treatment recommendations  PLAN:  We discussed the results of the bone density and recommendation for treatment.  We discussed bisphosphonate therapy with alendronate.  Daily versus weekly dosing reviewed.  She would prefer alendronate 70 mg weekly. Must sit up for 30-60 minutes after taking a bisphosphonate and drink with plenty of water.  Side effects can include musculoskeletal pains. Potential risks include upper GI tract pain  or ulcers and osteonecrosis of the jaw long with atypical fractures that are more likely with prolonged use of medication.  Do not take calcium or vitamin D for 1 hour after taking the medication.  She does not have renal dysfunction nor GERD or any history of esophageal stricture or gastric bypass surgery.  Also reviewed getting enough dietary calcium and she is taking 2000 IU vitamin D daily.  Recommended checking vitamin D level with her primary doctor.  Follow-up DEXA in 2 years.  We will see her back for her routine annual exam this coming month.   Theresia Majors MD 08/23/20

## 2020-09-13 ENCOUNTER — Encounter: Payer: Self-pay | Admitting: Obstetrics and Gynecology

## 2020-09-22 ENCOUNTER — Encounter: Payer: Self-pay | Admitting: Obstetrics and Gynecology

## 2020-09-26 ENCOUNTER — Encounter: Payer: No Typology Code available for payment source | Admitting: Obstetrics and Gynecology

## 2020-09-27 ENCOUNTER — Ambulatory Visit: Payer: No Typology Code available for payment source | Admitting: Obstetrics and Gynecology

## 2020-10-11 ENCOUNTER — Other Ambulatory Visit: Payer: Self-pay

## 2020-10-11 ENCOUNTER — Ambulatory Visit (INDEPENDENT_AMBULATORY_CARE_PROVIDER_SITE_OTHER): Payer: No Typology Code available for payment source | Admitting: Obstetrics and Gynecology

## 2020-10-11 ENCOUNTER — Encounter: Payer: Self-pay | Admitting: Obstetrics and Gynecology

## 2020-10-11 VITALS — BP 140/80 | Ht 62.0 in | Wt 103.0 lb

## 2020-10-11 DIAGNOSIS — M810 Age-related osteoporosis without current pathological fracture: Secondary | ICD-10-CM | POA: Diagnosis not present

## 2020-10-11 DIAGNOSIS — N898 Other specified noninflammatory disorders of vagina: Secondary | ICD-10-CM

## 2020-10-11 DIAGNOSIS — Z01419 Encounter for gynecological examination (general) (routine) without abnormal findings: Secondary | ICD-10-CM

## 2020-10-11 DIAGNOSIS — N83202 Unspecified ovarian cyst, left side: Secondary | ICD-10-CM

## 2020-10-11 NOTE — Progress Notes (Signed)
Monica Grant 02-13-1956 [id number]  SUBJECTIVE:  65 y.o. G2P2 female for annual routine gynecologic exam. She has no gynecologic concerns.  Current Outpatient Medications  Medication Sig Dispense Refill  . alendronate (FOSAMAX) 70 MG tablet Take 1 tablet (70 mg total) by mouth once a week. Take with a full glass of water on an empty stomach. 12 tablet 4  . amLODipine (NORVASC) 2.5 MG tablet Take 1 tablet (2.5 mg total) by mouth daily. 90 tablet 3  . atorvastatin (LIPITOR) 10 MG tablet Take 1 tablet (10 mg total) by mouth daily. 90 tablet 3  . Cholecalciferol (VITAMIN D3) 2000 units TABS Take 1 tablet by mouth daily.     No current facility-administered medications for this visit.   Allergies: Aspirin and Codeine  No LMP recorded. Patient is postmenopausal.  Past medical history,surgical history, problem list, medications, allergies, family history and social history were all reviewed and documented as reviewed in the EPIC chart.  ROS: Pertinent positives and negatives as reviewed in HPI    OBJECTIVE:  BP 140/80 (BP Location: Right Arm, Patient Position: Sitting, Cuff Size: Normal)   Ht 5\' 2"  (1.575 m)   Wt 103 lb (46.7 kg)   BMI 18.84 kg/m  The patient appears well, alert, oriented, in no distress.  BREAST EXAM: breasts appear normal, no suspicious masses, no skin or nipple changes or axillary nodes  PELVIC EXAM: VULVA: normal appearing vulva with atrophic change, no masses, tenderness or lesions, VAGINA: normal appearing vagina with atrophic change, pea-sized vaginal inclusion cyst present at the right introitus, nontender, normal color and discharge, no lesions, CERVIX: normal appearing cervix without discharge or lesions, UTERUS: uterus is normal size, shape, consistency and nontender, ADNEXA: Nontender, mobile mass present left side, size 3-4 cm, right adnexa is normal  Chaperone: Wandra Scot Bonham present during the examination  ASSESSMENT:  65 y.o. G2P2 here for annual  gynecologic exam  PLAN:   1. Postmenopausal.  No significant hot flashes or night sweats. No vaginal bleeding. 2. Pap smear 09/2019. Pap smear is not repeated today. No prior significant history of abnormal Pap smears.  3. Mammogram 06/2020. Will continue with annual mammography. Breast exam normal today. 4. Colonoscopy 2017. Recommended that she continue per the prescribed interval.   5.  Osteoporosis. DEXA 06/2020.  T score -3.1 at right femoral neck.  Patient had virtual encounter with [place] and we started her on alendronate 70 mg weekly. She has been tolerating this well and will plan to continue also vitamin D supplement and dietary calcium and weightbearing exercise. Next DEXA at the 2-year interval. 6.  History of bilateral ovarian cysts. The patient has had a persistent left ovarian cyst and has intermittently been followed for this.  Remains asymptomatic.  She has a known left mildly complex ovarian cyst, stable for 5 years now.  Normal CA-125.  Saw Dr. Denman George in gynecologic oncology 10/2019 for another opinion.  Patient opted for surveillance.  Last pelvic ultrasound 04/2020 indicated stable left ovarian cyst 5 x 4 x 3 cm.  Within the cyst there is an internal solid nodule with no Doppler flow seen.  We have discussed annual surveillance so she should return in about 6 months for another pelvic ultrasound. 7.  Question of endometrial polyp on 09/2019 ultrasound.  No vaginal bleeding.  Endometrium was 3.5 mm thickness.  Endometrial biopsy was performed with minimal tissue obtained/insufficient for analysis.  No vaginal bleeding since then.  Follow-up ultrasound for ovarian cyst 04/2020 showed endometrium 3 mm  with no irregularity visualized.  Would recommend just monitoring for any clinical signs of bleeding and for her to let us know if that is happening which point we would want to investigate further. 8. Vaginal inclusion cyst. Discussed this finding. Asymptomatic. Will let us know if there are any  bothersome symptoms such as pain, drainage, or discomfort during intercourse. Recommended consistently using lubricant with any sexual activity as inclusion cysts can arise from healing of minor tissue trauma. 9. Health maintenance.  No lab work as she has this completed elsewhere.  The patient is aware that I will only be at this practice until early March 2022 so she knows to make sure she requests follow-up on results for any medical tests that she does when I am no longer at the practice.   Return annually or sooner, prn.  Joseph Pierini MD  10/11/20

## 2021-05-04 ENCOUNTER — Other Ambulatory Visit: Payer: Self-pay | Admitting: *Deleted

## 2021-05-04 ENCOUNTER — Other Ambulatory Visit: Payer: Self-pay

## 2021-05-04 ENCOUNTER — Encounter: Payer: Self-pay | Admitting: Obstetrics and Gynecology

## 2021-05-04 ENCOUNTER — Ambulatory Visit (INDEPENDENT_AMBULATORY_CARE_PROVIDER_SITE_OTHER): Payer: Medicare Other | Admitting: Obstetrics and Gynecology

## 2021-05-04 ENCOUNTER — Telehealth: Payer: Self-pay | Admitting: Obstetrics and Gynecology

## 2021-05-04 ENCOUNTER — Ambulatory Visit (INDEPENDENT_AMBULATORY_CARE_PROVIDER_SITE_OTHER): Payer: Medicare Other

## 2021-05-04 VITALS — BP 148/80 | HR 66 | Resp 14 | Ht 62.0 in | Wt 101.0 lb

## 2021-05-04 DIAGNOSIS — Z8041 Family history of malignant neoplasm of ovary: Secondary | ICD-10-CM

## 2021-05-04 DIAGNOSIS — N83202 Unspecified ovarian cyst, left side: Secondary | ICD-10-CM | POA: Diagnosis not present

## 2021-05-04 DIAGNOSIS — D3912 Neoplasm of uncertain behavior of left ovary: Secondary | ICD-10-CM

## 2021-05-04 DIAGNOSIS — N644 Mastodynia: Secondary | ICD-10-CM | POA: Diagnosis not present

## 2021-05-04 NOTE — Progress Notes (Signed)
GYNECOLOGY  VISIT   HPI: 65 y.o.   Married  Caucasian  female   G2P2 with No LMP recorded. Patient is postmenopausal.   here for   Pelvic Ultrasound for recheck of left ovarian cyst and evaluation of left breast pain.   Has left breast pain for 1.5 weeks.  Patient does not feel a mass.  Has been doing increased physical activity.  Has history of left ovarian cyst for 5 years.  Saw Dr. Denman George in March, 2021.  Last pelvic US done 04/28/20: Left ovarian cyst measured 4.3 x 4.7 x 4.5 cm 3 mm nodule in inferior cyst wall.  Fibroids also noted.  Normal CA125 of 7 on 09/24/19.  CA19-9 mildly elevated and followed by GI.  Last Ca 19-9 level was normal at 26 on 07/05/20.  Denies pain and bleeding.   Mother died of ovarian cancer in January 22, 2021.  She was diagnosed 5 years ago. She also had follicular lymphoma.  Father had pancreatic cancer.   One sister had small cell cancer of the lung.   GYNECOLOGIC HISTORY: No LMP recorded. Patient is postmenopausal. 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 Take with a full glass of water on an empty stomach. 12 tablet 4   amLODipine (NORVASC) 2.5 MG tablet Take 1 tablet (2.5 mg total) by mouth daily. 90 tablet 3   atorvastatin (LIPITOR) 10 MG tablet Take 1 tablet (10 mg total) by mouth daily. 90 tablet 3   Cholecalciferol (VITAMIN D3) 2000 units TABS Take 1 tablet by mouth daily.     No current facility-administered medications for this visit.     ALLERGIES: Aspirin and Codeine  Family History  Problem Relation Age of Onset   Ovarian cancer Mother    Lymphoma Mother    Pancreatic cancer Father 41       pancreatic   Cancer Sister        ? type   COPD Sister    Heart disease Sister    Mental illness Sister    Heart disease Sister    Heart disease  Brother 28       MI   Heart disease Paternal Grandfather    Stomach cancer Neg Hx    Throat cancer Neg Hx     Social History   Socioeconomic History   Marital status: Married    Spouse name: Not on file   Number of children: 2   Years of education: Not on file   Highest education level: Not on file  Occupational History   Not on file  Tobacco Use   Smoking status: Former    Types: Cigarettes    Quit date: 04/04/1987    Years since quitting: 34.1   Smokeless tobacco: Never  Vaping Use   Vaping Use: Never used  Substance and Sexual Activity   Alcohol use: Not Currently   Drug use: No   Sexual activity: Yes    Partners: Male    Comment: 1st intercourse 11 yo-1 partner  Other Topics Concern   Not on file  Social History Narrative   Not on file   Social Determinants of Health   Financial Resource Strain: Not on file  Food Insecurity: Not on file  Transportation Needs: Not on file  Physical Activity: Not on file  Stress: Not on file  Social Connections: Not on file  Intimate Partner Violence: Not on file    Review of Systems  Genitourinary:         Left breast pain for 1.5 weeks  All other systems reviewed and are negative.  PHYSICAL EXAMINATION:    BP (!) 148/80 (BP Location: Right Arm, Patient Position: Sitting, Cuff Size: Large)   Pulse 66   Resp 14   Ht '5\' 2"'$  (1.575 m)   Wt 101 lb (45.8 kg)   BMI 18.47 kg/m     General appearance: alert, cooperative and appears stated age Breasts:  no dominant masses, no skin retraction, no nipple discharge, no axillary adenopathy.   Pelvic US  Uterus 5.31 x 4.76 x 4.37 cm.  EMS 3.11 mm.  Fibroid 1.44 cm, anterior subserous.  Right ovary atrophic with  7 mm follicle noted. Left ovary with cyst 4.8 x 3.9 x 3.0 cm and small avascular nodules.   No adnexal masses.  No free fluid.   ASSESSMENT  Left ovarian cyst with new finding of increased nodularity.  FH ovarian cancer.  Left breast pain.  Reassuring exam.  Possible muscle strain.  PLAN  We discussed her ultrasound findings and images.   Check CA125. I recommend return to [place] for potential surgical evaluation and treatment. We discussed potential laparoscopic approach for care.  I will assist with this referral.  I suggested an anti-inflammatory such as Ibuprofen for breast pain along with ice or heat.  Screening mammogram in November, 2022. Fu prn.   An After Visit Summary was printed and given to the patient.  30 min  total time was spent for this patient encounter, including preparation, face-to-face counseling with the patient, coordination of care, and documentation of the encounter.

## 2021-05-04 NOTE — Telephone Encounter (Signed)
Please make referral back to GYN ONC for complex left ovarian cyst and family history of ovarian cancer.  Her mother died of ovarian cancer in 18-Jan-2021.   Patient's left ovarian cyst is developing nodules of the cyst wall that are avascular.   CA125 drawn today.

## 2021-05-05 LAB — CA 125: CA 125: 8 U/mL (ref <35)

## 2021-05-05 NOTE — Telephone Encounter (Signed)
Referral placed in epic at [facility] oncology to call and schedule.

## 2021-05-09 NOTE — Telephone Encounter (Signed)
Patient scheduled on 05/19/21 @ 3:00 pm with Dr.Tucker

## 2021-05-19 ENCOUNTER — Inpatient Hospital Stay: Payer: No Typology Code available for payment source | Admitting: Gynecologic Oncology

## 2021-05-19 ENCOUNTER — Telehealth: Payer: Self-pay | Admitting: *Deleted

## 2021-05-19 NOTE — Telephone Encounter (Signed)
Returned the patient's call and rescheduled her to 10/10

## 2021-05-29 ENCOUNTER — Other Ambulatory Visit: Payer: Self-pay

## 2021-05-29 ENCOUNTER — Encounter: Payer: Self-pay | Admitting: Gynecologic Oncology

## 2021-05-29 ENCOUNTER — Inpatient Hospital Stay: Payer: Medicare Other | Attending: Gynecologic Oncology | Admitting: Gynecologic Oncology

## 2021-05-29 VITALS — BP 150/83 | HR 93 | Temp 98.0°F | Resp 18 | Ht 62.0 in | Wt 104.0 lb

## 2021-05-29 DIAGNOSIS — N83202 Unspecified ovarian cyst, left side: Secondary | ICD-10-CM | POA: Insufficient documentation

## 2021-05-29 DIAGNOSIS — D259 Leiomyoma of uterus, unspecified: Secondary | ICD-10-CM | POA: Insufficient documentation

## 2021-05-29 DIAGNOSIS — Z8041 Family history of malignant neoplasm of ovary: Secondary | ICD-10-CM | POA: Diagnosis not present

## 2021-05-29 NOTE — Progress Notes (Signed)
Gynecologic Oncology Return Clinic Visit  05/29/2021  Reason for Visit: Treatment planning  Treatment History: 10/20/2019: The patient was initially seen in our clinic with a known left ovarian cyst since imaging in 2017 for kidney stone identified.  It was monitored in [place] where she lived previously with serial ultrasounds and remained stable.  [place] continued to have surveillance.  An ultrasound scan performed on Fab[place] 25th 2021 revealed a small anteverted uterus measuring 5.7 x 4.1 x 3.3 cm.  There was a small echogenic mass suspicious for a polyp measuring 9 x 6 mm with internal blood flow.  The right ovary contained a 9 mm simple follicle.  The left ovary contained a cystic mass measuring 5.1 x 4.3 x 3.3 cm with a 3 mm nodule noted along the periphery with positive blood flow.  There was moderate free fluid noted in the posterior cul-de-sac measuring 2.2 cm.  The patient had previously undergone a CT scan of the abdomen and pelvis on September 03, 2019 at [facility] to evaluate and rule out kidney stone.  It was a noncontrasted scan.  There were no stones or hydronephrosis identified.  There is no retroperitoneal or pelvic adenopathy.  There was no ascites or carcinomatosis noted on scan report.  There was an oval fluid collection to the left of the uterus measuring 4.6 cm and could represent either loculated fluid or adnexal cyst.  There is a small associated calcification in its posterior wall.  To the right of the uterus there was a more elliptical and probably free fluid collection. Patient was offered continued surveillance versus surgery and at that time opted for continued surveillance.  04/28/2020: Pelvic ultrasound shows no change in multiple small fibroids noted within the patient's uterus.  Right ovary with small simple 6 mm cyst, stable.  Left ovary with stable thin-walled complex cyst measuring 5 x 4 x 3 cm with inferior cyst wall nodule measuring 3 mm, no  Doppler flow.  04/28/2021: Pelvic ultrasound at outside clinic reported to show uterus measures 5.3 x 4.8 x 4.4 cm with an endometrial lining measuring 3.1 mm.  Right ovary atrophic with 7 mm follicle noted.  Left ovary with a 4.8 x 3.9 x 3 cm cyst and small avascular nodules.  No free fluid seen.  Component Ref Range & Units 3 wk ago 1 yr ago 3 yr ago 4 yr ago  CA 125 <35 U/mL 8  7 CM  8 CM  8 CM     Interval History: Patient presents today for discussion regarding possible surgical intervention.  She denies any vaginal bleeding or discharge.  She denies any pelvic or abdominal pain.  She has occasional constipation that is her baseline, for which she uses dietary changes to improve.  She denies any bladder symptoms.  She endorses a good appetite without nausea or emesis.  She denies any abdominal bloating or early satiety.  Since her last visit with [place], she has been diagnosed with osteoporosis and is now on Fosamax.  Her mother also passed away earlier this year.  She had a history of ovarian cancer initially treated in 2018.  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  She had genetic testing that the patient reports was negative.  Husband is present for much of the visit by phone.  Past Medical/Surgical History: Past Medical History:  Diagnosis Date   Elevated CA 19-9 level  Hyperlipidemia    Hypertension    Kidney stones    Pancreatic cyst     Past Surgical History:  Procedure Laterality Date   CARDIAC CATHETERIZATION      Family History  Problem Relation Age of Onset   Ovarian cancer Mother    Lymphoma Mother    Pancreatic cancer Father 61       pancreatic   Cancer Sister        ? type   COPD Sister    Heart disease Sister    Mental illness Sister    Heart disease Sister    Heart disease Brother 37       MI   Heart disease Paternal Grandfather    Stomach cancer Neg Hx    Throat cancer Neg Hx      Social History   Socioeconomic History   Marital status: Married    Spouse name: Not on file   Number of children: 2   Years of education: Not on file   Highest education level: Not on file  Occupational History   Not on file  Tobacco Use   Smoking status: Former    Types: Cigarettes    Quit date: 04/04/1987    Years since quitting: 34.1   Smokeless tobacco: Never  Vaping Use   Vaping Use: Never used  Substance and Sexual Activity   Alcohol use: Not Currently   Drug use: No   Sexual activity: Yes    Partners: Male    Comment: 1st intercourse 84 yo-1 partner  Other Topics Concern   Not on file  Social History Narrative   Not on file   Social Determinants of Health   Financial Resource Strain: Not on file  Food Insecurity: Not on file  Transportation Needs: Not on file  Physical Activity: Not on file  Stress: Not on file  Social Connections: Not on file    Current Medications:  Current Outpatient Medications:    alendronate (FOSAMAX) 70 MG tablet, Take 1 tablet (70 mg total) by mouth once a week. Take with a full glass of water on an empty stomach., Disp: 12 tablet, Rfl: 4   amLODipine (NORVASC) 2.5 MG tablet, Take 1 tablet (2.5 mg total) by mouth daily., Disp: 90 tablet, Rfl: 3   atorvastatin (LIPITOR) 10 MG tablet, Take 1 tablet (10 mg total) by mouth daily., Disp: 90 tablet, Rfl: 3   Cholecalciferol (VITAMIN D3) 2000 units TABS, Take 1 tablet by mouth daily., Disp: , Rfl:   Review of Systems: Denies appetite changes, fevers, chills, fatigue, unexplained weight changes. Denies hearing loss, neck lumps or masses, mouth sores, ringing in ears or voice changes. Denies cough or wheezing.  Denies shortness of breath. Denies chest pain or palpitations. Denies leg swelling. Denies abdominal distention, pain, blood in stools, constipation, diarrhea, nausea, vomiting, or early satiety. Denies pain with intercourse, dysuria, frequency, hematuria or  incontinence. Denies hot flashes, pelvic pain, vaginal bleeding or vaginal discharge.   Denies joint pain, back pain or muscle pain/cramps. Denies itching, rash, or wounds. Denies dizziness, headaches, numbness or seizures. Denies swollen lymph nodes or glands, denies easy bruising or bleeding. Denies anxiety, depression, confusion, or decreased concentration.  Physical Exam: BP (!) 150/83 (BP Location: Left Arm, Patient Position: Sitting)   Pulse 93   Temp 98 F (36.7 C) (Oral)   Resp 18   Ht 5\' 2"  (1.575 m)   Wt 104 lb (47.2 kg)   SpO2 100%   BMI 19.02 kg/m  General: Alert, oriented, no acute distress. HEENT: Normal cephalic, atraumatic, sclera anicteric. Chest: Clear to auscultation bilaterally.  No wheezes or rhonchi. Cardiovascular: Regular rate and rhythm, no murmurs. Abdomen: soft, nontender.  Normoactive bowel sounds.  No masses or hepatosplenomegaly appreciated.   Extremities: Grossly normal range of motion.  Warm, well perfused.  No edema bilaterally. Skin: No rashes or lesions noted. Lymphatics: No cervical, supraclavicular, or inguinal adenopathy. GU: Normal appearing external genitalia without erythema, excoriation.  Bimanual exam reveals small mobile uterus.  3-4 cm mass that is smooth felt in the right adnexa, moves in conjunction with the uterus.  No nodularity appreciated. Rectovaginal exam confirms findings.  Laboratory & Radiologic Studies: Recent CA-125: 8  Assessment & Plan: Monica Grant is a 65 y.o. woman with family history of ovarian cancer who has been followed for the last 5-6 years for a left ovarian cyst, now with multiple avascular nodules on most recent imaging.  Reviewed most recent ultrasound findings, which shows stable size of her left adnexal mass but several nodules. These are avascular and I think overall of low concern for malignancy. We discussed that the only way to definitively rule out borderline tumor or malignancy is to proceed with  surgical excision.   I offered the patient one of two choices: the first would be to continue with close surveillance with intervention in the future if mass were to grow or look more concerning on imaging. The other is to proceed with scheduling surgery, which at minimum I would recommend include bilateral salpingo-oophorectomy. We discussed the risks and benefits of each, and ultimately the patient would like to move forward in the near future with scheduling surgery.   Her husband is undergoing surgery for prostate cancer, although this is not yet scheduled. I offered the patient a date for surgery in the next week and a half or two. Given the small but present risk that she would require larger surgery with staging and laparotomy (if malignancy found) and what this word mean from a recovery standpoint, she is most comfortable postponing her surgery until after her husband has recovered from his.   Plan will be: robotic BSO, possible mini-lap, possible total hysterectomy, possible staging, possible laparotomy. She also has some pain with vaginal insertion and was previously found to have what was believed to be a vaginal inclusion cyst. If any findings present at the time of surgery, will also plan for excision of vulvar lesion.   She will return for pre-op visit at a later date closer to her surgery.  50 minutes of total time was spent for this patient encounter, including preparation, face-to-face counseling with the patient and coordination of care, and documentation of the encounter.  Jeral Pinch, MD  Division of Gynecologic Oncology  Department of Obstetrics and Gynecology  [facility]

## 2021-05-29 NOTE — Patient Instructions (Addendum)
It was a pleasure to meet you. Please call the office once you know when your husband's surgery will be and the recovery time. We will schedule your surgery after accordingly.  It looks like there may be a small cyst on your labia (this is called an inclusion cyst) - if I see anything to remove at the time of surgery, I am happy to do this.

## 2021-07-04 ENCOUNTER — Encounter: Payer: Self-pay | Admitting: Family Medicine

## 2021-07-10 NOTE — Progress Notes (Signed)
HPI: Monica Grant is a 65 y.o. female, who is here today for her routine physical.  Last CPE: 07/05/20  Regular exercise 3 or more time per week: Walks a few times per week if it is not very cold.  Following a healthy diet: Yes, she does. She cooks at home. She lives with her husband.  Chronic medical problems: Osteoporosis, left ovarian cyst, pancreatic cyst, HLD.  Immunization History  Administered Date(s) Administered   Influenza,inj,Quad PF,6+ Mos 07/05/2020   Influenza-Unspecified 07/07/2021   Moderna Covid-19 Vaccine Bivalent Booster 51yrs & up 06/06/2021   PFIZER(Purple Top)SARS-COV-2 Vaccination 11/06/2019, 12/01/2019, 06/10/2020, 12/22/2020   Tdap 01/14/2018   Zoster Recombinat (Shingrix) 09/26/2017, 01/30/2018   Health Maintenance  Topic Date Due   MAMMOGRAM  06/17/2020   Pneumonia Vaccine 43+ Years old (1 - PCV) Never done   PAP SMEAR-Modifier  10/14/2022   COLONOSCOPY (Pts 45-6yrs Insurance coverage will need to be confirmed)  01/18/2026   TETANUS/TDAP  01/15/2028   INFLUENZA VACCINE  Completed   DEXA SCAN  Completed   COVID-19 Vaccine  Completed   Hepatitis C Screening  Completed   HIV Screening  Completed   Zoster Vaccines- Shingrix  Completed   HPV VACCINES  Aged Out   She follows with gyn regularly, Dr Delilah Shan. She has pelvic US q 6 months. Following with oncologist gyn for ovarian cyst, planning on having surgical procedure maybe next year.  HLD: She is on Atorvastatin 10 mg daily.  Lab Results  Component Value Date   CHOL 193 07/05/2020   HDL 87 07/05/2020   LDLCALC 89 07/05/2020   TRIG 77 07/05/2020   CHOLHDL 2.2 07/05/2020   HTN on Amlodipine 2.5 mg daily. Home BP readings: Most 120-130's/60's. A few SBP's in the low 100's. Lightheadedness when she gets up fast, stable for a while.She moves slowly, it does not happen when she does so.  Lab Results  Component Value Date   CREATININE 0.69 07/05/2020   BUN 16 07/05/2020   NA 139  07/05/2020   K 4.5 07/05/2020   CL 102 07/05/2020   CO2 29 07/05/2020   She is also requesting Cancer antigen 19.9. Pancreatic cyst. She has followed with GI and had MRI in 05/2018:. Stable small cystic lesions in the pancreatic head and tail since prior outside MRI from 2017.  Requesting topical treatment for fever blister, she gets flare ups 1-2 times per year. She has not identified exacerbating factor. Last episode a few days ago, lesion is healing.  Review of Systems  Constitutional:  Negative for appetite change, chills, fever and unexpected weight change.  HENT:  Negative for hearing loss, mouth sores, trouble swallowing and voice change.   Eyes:  Negative for redness and visual disturbance.  Respiratory:  Negative for cough, shortness of breath and wheezing.   Cardiovascular:  Negative for chest pain and leg swelling.  Gastrointestinal:  Negative for abdominal pain, nausea and vomiting.       No changes in bowel habits.  Endocrine: Negative for cold intolerance, heat intolerance, polydipsia, polyphagia and polyuria.  Genitourinary:  Negative for decreased urine volume, dysuria, hematuria, vaginal bleeding and vaginal discharge.  Musculoskeletal:  Negative for gait problem and myalgias.  Skin:  Negative for color change and rash.  Neurological:  Negative for syncope, weakness and headaches.  Psychiatric/Behavioral:  Negative for confusion. The patient is nervous/anxious.   All other systems reviewed and are negative.  Current Outpatient Medications on File Prior to Visit  Medication Sig Dispense  Refill   alendronate (FOSAMAX) 70 MG tablet Take 1 tablet (70 mg total) by mouth once a week. Take with a full glass of water on an empty stomach. 12 tablet 4   amLODipine (NORVASC) 2.5 MG tablet Take 1 tablet (2.5 mg total) by mouth daily. 90 tablet 3   atorvastatin (LIPITOR) 10 MG tablet Take 1 tablet (10 mg total) by mouth daily. 90 tablet 3   Cholecalciferol (VITAMIN D3) 2000  units TABS Take 1 tablet by mouth daily.     No current facility-administered medications on file prior to visit.   Past Medical History:  Diagnosis Date   Elevated CA 19-9 level    Hyperlipidemia    Hypertension    Kidney stones    Pancreatic cyst    Past Surgical History:  Procedure Laterality Date   CARDIAC CATHETERIZATION     Allergies  Allergen Reactions   Aspirin Other (See Comments)    GI upset   Codeine Other (See Comments)    GI upset   Family History  Problem Relation Age of Onset   Ovarian cancer Mother    Lymphoma Mother    Pancreatic cancer Father 71       pancreatic   Cancer Sister        ? type   COPD Sister    Heart disease Sister    Mental illness Sister    Heart disease Sister    Heart disease Brother 69       MI   Heart disease Paternal Grandfather    Stomach cancer Neg Hx    Throat cancer Neg Hx     Social History   Socioeconomic History   Marital status: Married    Spouse name: Not on file   Number of children: 2   Years of education: Not on file   Highest education level: Not on file  Occupational History   Not on file  Tobacco Use   Smoking status: Former    Types: Cigarettes    Quit date: 04/04/1987    Years since quitting: 34.2   Smokeless tobacco: Never  Vaping Use   Vaping Use: Never used  Substance and Sexual Activity   Alcohol use: Not Currently   Drug use: No   Sexual activity: Yes    Partners: Male    Comment: 1st intercourse 51 yo-1 partner  Other Topics Concern   Not on file  Social History Narrative   Not on file   Social Determinants of Health   Financial Resource Strain: Not on file  Food Insecurity: Not on file  Transportation Needs: Not on file  Physical Activity: Not on file  Stress: Not on file  Social Connections: Not on file   Vitals:   07/11/21 0751  BP: 126/74  Pulse: 71  Resp: 12  SpO2: 99%   Body mass index is 18.84 kg/m.  Wt Readings from Last 3 Encounters:  07/11/21 103 lb (46.7  kg)  05/29/21 104 lb (47.2 kg)  05/04/21 101 lb (45.8 kg)   Physical Exam Vitals and nursing note reviewed.  Constitutional:      General: She is not in acute distress.    Appearance: She is well-developed and normal weight.  HENT:     Head: Normocephalic and atraumatic.     Right Ear: Tympanic membrane, ear canal and external ear normal.     Left Ear: Tympanic membrane, ear canal and external ear normal.     Mouth/Throat:  Mouth: Mucous membranes are moist.     Pharynx: Oropharynx is clear. Uvula midline.   Eyes:     Extraocular Movements: Extraocular movements intact.     Conjunctiva/sclera: Conjunctivae normal.     Pupils: Pupils are equal, round, and reactive to light.  Neck:     Trachea: No tracheal deviation.  Cardiovascular:     Rate and Rhythm: Normal rate and regular rhythm.     Pulses:          Dorsalis pedis pulses are 2+ on the right side and 2+ on the left side.     Heart sounds: No murmur heard. Pulmonary:     Effort: Pulmonary effort is normal. No respiratory distress.     Breath sounds: Normal breath sounds.  Abdominal:     Palpations: Abdomen is soft. There is no hepatomegaly or mass.     Tenderness: There is no abdominal tenderness.  Genitourinary:    Comments: Deferred to gyn. Musculoskeletal:     Comments: No signs of synovitis appreciated.  Lymphadenopathy:     Cervical: No cervical adenopathy.     Upper Body:     Right upper body: No supraclavicular adenopathy.     Left upper body: No supraclavicular adenopathy.  Skin:    General: Skin is warm.     Findings: No erythema or rash.  Neurological:     General: No focal deficit present.     Mental Status: She is alert and oriented to person, place, and time.     Cranial Nerves: No cranial nerve deficit.     Coordination: Coordination normal.     Gait: Gait normal.     Deep Tendon Reflexes:     Reflex Scores:      Bicep reflexes are 2+ on the right side and 2+ on the left side.      Patellar  reflexes are 2+ on the right side and 2+ on the left side. Psychiatric:        Speech: Speech normal.     Comments: Well groomed, good eye contact.   ASSESSMENT AND PLAN:  Ms. Latoyia Tecson was here today annual physical examination.  Orders Placed This Encounter  Procedures   Cancer Antigen 19-9   Comprehensive metabolic panel   Lipid panel   Lab Results  Component Value Date   CHOL 190 07/11/2021   HDL 86.60 07/11/2021   LDLCALC 92 07/11/2021   TRIG 57.0 07/11/2021   CHOLHDL 2 07/11/2021   Lab Results  Component Value Date   CREATININE 0.69 07/11/2021   BUN 13 07/11/2021   NA 139 07/11/2021   K 3.5 07/11/2021   CL 101 07/11/2021   CO2 29 07/11/2021   Lab Results  Component Value Date   ALT 13 07/11/2021   AST 20 07/11/2021   ALKPHOS 66 07/11/2021   BILITOT 0.6 07/11/2021   Routine general medical examination at a health care facility We discussed the importance of regular physical activity and healthy diet for prevention of chronic illness and/or complications. Preventive guidelines reviewed. Vaccination: Prefers to hold on pneumovax, just got flu vaccine a week ago. Ca++ and vit D supplementation recommended. Continue female care with her gyn. Next CPE in a year.  The 10-year ASCVD risk score (Arnett DK, et al., 2019) is: 5.8%   Values used to calculate the score:     Age: 72 years     Sex: Female     Is Non-Hispanic African American: No     Diabetic:  No     Tobacco smoker: No     Systolic Blood Pressure: 625 mmHg     Is BP treated: Yes     HDL Cholesterol: 86.6 mg/dL     Total Cholesterol: 190 mg/dL  Hypertension, essential, benign Some home BP's on lower normal range. For now continue Amlodipine 2.5 mg daily but is lower normal readings become more frequent, we may need to decrease or stop medication. Continue low salt diet.  Hyperlipidemia Continue Atorvastatin 10 mg dialy and low fat diet. Further recommendations according to FLP  result.  Recurrent herpes labialis She prefers topical treatment, Acyclovir oint recommended to start upon acute onset.  Pancreatic cyst CA 19-9 ordered as requested. She was instructed to arrange f/u with GI.  I spent a total of 47 minutes in both face to face and non face to face activities for this visit on the date of this encounter. During this time history was obtained and documented, examination was performed, prior labs/imaging reviewed, and assessment/plan discussed.  Return in 1 year (on 07/11/2022).  Calia Napp G. [place], MD  [facility]. [place] office.

## 2021-07-11 ENCOUNTER — Ambulatory Visit (INDEPENDENT_AMBULATORY_CARE_PROVIDER_SITE_OTHER): Payer: Medicare Other | Admitting: Family Medicine

## 2021-07-11 ENCOUNTER — Encounter: Payer: Self-pay | Admitting: Family Medicine

## 2021-07-11 VITALS — BP 126/74 | HR 71 | Resp 12 | Ht 62.0 in | Wt 103.0 lb

## 2021-07-11 DIAGNOSIS — Z0000 Encounter for general adult medical examination without abnormal findings: Secondary | ICD-10-CM

## 2021-07-11 DIAGNOSIS — I10 Essential (primary) hypertension: Secondary | ICD-10-CM | POA: Diagnosis not present

## 2021-07-11 DIAGNOSIS — B001 Herpesviral vesicular dermatitis: Secondary | ICD-10-CM

## 2021-07-11 DIAGNOSIS — K862 Cyst of pancreas: Secondary | ICD-10-CM | POA: Diagnosis not present

## 2021-07-11 DIAGNOSIS — E785 Hyperlipidemia, unspecified: Secondary | ICD-10-CM | POA: Diagnosis not present

## 2021-07-11 LAB — COMPREHENSIVE METABOLIC PANEL
ALT: 13 U/L (ref 0–35)
AST: 20 U/L (ref 0–37)
Albumin: 4.9 g/dL (ref 3.5–5.2)
Alkaline Phosphatase: 66 U/L (ref 39–117)
BUN: 13 mg/dL (ref 6–23)
CO2: 29 mEq/L (ref 19–32)
Calcium: 9.8 mg/dL (ref 8.4–10.5)
Chloride: 101 mEq/L (ref 96–112)
Creatinine, Ser: 0.69 mg/dL (ref 0.40–1.20)
GFR: 90.89 mL/min (ref >60.00)
Glucose, Bld: 87 mg/dL (ref 70–99)
Potassium: 3.5 mEq/L (ref 3.5–5.1)
Sodium: 139 mEq/L (ref 135–145)
Total Bilirubin: 0.6 mg/dL (ref 0.2–1.2)
Total Protein: 7.9 g/dL (ref 6.0–8.3)

## 2021-07-11 LAB — LIPID PANEL
Cholesterol: 190 mg/dL (ref 0–200)
HDL: 86.6 mg/dL (ref >39.00)
LDL Cholesterol: 92 mg/dL (ref 0–99)
NonHDL: 103.89
Total CHOL/HDL Ratio: 2
Triglycerides: 57 mg/dL (ref 0.0–149.0)
VLDL: 11.4 mg/dL (ref 0.0–40.0)

## 2021-07-11 MED ORDER — ACYCLOVIR 5 % EX OINT: 1.0000 "application " | TOPICAL_OINTMENT | CUTANEOUS | 1 refills | Status: AC | PRN

## 2021-07-11 NOTE — Assessment & Plan Note (Signed)
CA 19-9 ordered as requested. She was instructed to arrange f/u with GI.

## 2021-07-11 NOTE — Assessment & Plan Note (Signed)
Some home BP's on lower normal range. For now continue Amlodipine 2.5 mg daily but is lower normal readings become more frequent, we may need to decrease or stop medication. Continue low salt diet.

## 2021-07-11 NOTE — Patient Instructions (Addendum)
A few things to remember from today's visit:   Routine general medical examination at a health care facility  Hypertension, essential, benign - Plan: Comprehensive metabolic panel  Hyperlipidemia, unspecified hyperlipidemia type - Plan: Lipid panel  Pancreatic cyst - Plan: Cancer Antigen 19-9  Recurrent herpes labialis - Plan: acyclovir ointment (ZOVIRAX) 5 %  If you need refills please call your pharmacy. Do not use My Chart to request refills or for acute issues that need immediate attention.   Continue monitoring blood pressure. Some of your labs may not be covered by insurance. If blood pressure frequently in the low 100's/60's, we will need to consider decreasing or stopping medication. Arrange appt with gastroenterologist.  Please be sure medication list is accurate. If a new problem present, please set up appointment sooner than planned today. Health Maintenance, Female Adopting a healthy lifestyle and getting preventive care are important in promoting health and wellness. Ask your health care provider about: The right schedule for you to have regular tests and exams. Things you can do on your own to prevent diseases and keep yourself healthy. What should I know about diet, weight, and exercise? Eat a healthy diet  Eat a diet that includes plenty of vegetables, fruits, low-fat dairy products, and lean protein. Do not eat a lot of foods that are high in solid fats, added sugars, or sodium. Maintain a healthy weight Body mass index (BMI) is used to identify weight problems. It estimates body fat based on height and weight. Your health care provider can help determine your BMI and help you achieve or maintain a healthy weight. Get regular exercise Get regular exercise. This is one of the most important things you can do for your health. Most adults should: Exercise for at least 150 minutes each week. The exercise should increase your heart rate and make you sweat  (moderate-intensity exercise). Do strengthening exercises at least twice a week. This is in addition to the moderate-intensity exercise. Spend less time sitting. Even light physical activity can be beneficial. Watch cholesterol and blood lipids Have your blood tested for lipids and cholesterol at 65 years of age, then have this test every 5 years. Have your cholesterol levels checked more often if: Your lipid or cholesterol levels are high. You are older than 65 years of age. You are at high risk for heart disease. What should I know about cancer screening? Depending on your health history and family history, you may need to have cancer screening at various ages. This may include screening for: Breast cancer. Cervical cancer. Colorectal cancer. Skin cancer. Lung cancer. What should I know about heart disease, diabetes, and high blood pressure? Blood pressure and heart disease High blood pressure causes heart disease and increases the risk of stroke. This is more likely to develop in people who have high blood pressure readings or are overweight. Have your blood pressure checked: Every 3-5 years if you are 76-87 years of age. Every year if you are 63 years old or older. Diabetes Have regular diabetes screenings. This checks your fasting blood sugar level. Have the screening done: Once every three years after age 56 if you are at a normal weight and have a low risk for diabetes. More often and at a younger age if you are overweight or have a high risk for diabetes. What should I know about preventing infection? Hepatitis B If you have a higher risk for hepatitis B, you should be screened for this virus. Talk with your health care provider  to find out if you are at risk for hepatitis B infection. Hepatitis C Testing is recommended for: Everyone born from [birth year] through 1965. Anyone with known risk factors for hepatitis C. Sexually transmitted infections (STIs) Get screened for STIs,  including gonorrhea and chlamydia, if: You are sexually active and are younger than 65 years of age. You are older than 65 years of age and your health care provider tells you that you are at risk for this type of infection. Your sexual activity has changed since you were last screened, and you are at increased risk for chlamydia or gonorrhea. Ask your health care provider if you are at risk. Ask your health care provider about whether you are at high risk for HIV. Your health care provider may recommend a prescription medicine to help prevent HIV infection. If you choose to take medicine to prevent HIV, you should first get tested for HIV. You should then be tested every 3 months for as long as you are taking the medicine. Pregnancy If you are about to stop having your period (premenopausal) and you may become pregnant, seek counseling before you get pregnant. Take 400 to 800 micrograms (mcg) of folic acid every day if you become pregnant. Ask for birth control (contraception) if you want to prevent pregnancy. Osteoporosis and menopause Osteoporosis is a disease in which the bones lose minerals and strength with aging. This can result in bone fractures. If you are 48 years old or older, or if you are at risk for osteoporosis and fractures, ask your health care provider if you should: Be screened for bone loss. Take a calcium or vitamin D supplement to lower your risk of fractures. Be given hormone replacement therapy (HRT) to treat symptoms of menopause. Follow these instructions at home: Alcohol use Do not drink alcohol if: Your health care provider tells you not to drink. You are pregnant, may be pregnant, or are planning to become pregnant. If you drink alcohol: Limit how much you have to: 0-1 drink a day. Know how much alcohol is in your drink. In the [place] one drink equals one 12 oz bottle of beer (355 mL), one 5 oz glass of wine (148 mL), or one 1 oz glass of hard liquor (44  mL). Lifestyle Do not use any products that contain nicotine or tobacco. These products include cigarettes, chewing tobacco, and vaping devices, such as e-cigarettes. If you need help quitting, ask your health care provider. Do not use street drugs. Do not share needles. Ask your health care provider for help if you need support or information about quitting drugs. General instructions Schedule regular health, dental, and eye exams. Stay current with your vaccines. Tell your health care provider if: You often feel depressed. You have ever been abused or do not feel safe at home. Summary Adopting a healthy lifestyle and getting preventive care are important in promoting health and wellness. Follow your health care provider's instructions about healthy diet, exercising, and getting tested or screened for diseases. Follow your health care provider's instructions on monitoring your cholesterol and blood pressure. This information is not intended to replace advice given to you by your health care provider. Make sure you discuss any questions you have with your health care provider. Document Revised: 12/26/2020 Document Reviewed: 12/26/2020 Elsevier Patient Education  [place].

## 2021-07-11 NOTE — Assessment & Plan Note (Signed)
Continue Atorvastatin 10 mg dialy and low fat diet. Further recommendations according to FLP result.

## 2021-07-11 NOTE — Assessment & Plan Note (Signed)
She prefers topical treatment, Acyclovir oint recommended to start upon acute onset.

## 2021-07-12 LAB — CANCER ANTIGEN 19-9: CA 19-9: 26 U/mL (ref <34)

## 2021-07-17 ENCOUNTER — Telehealth: Payer: Self-pay

## 2021-07-17 NOTE — Telephone Encounter (Signed)
Received call from Ms. Britain. Patient reports receiving a bill from [employer] her previous insurer. Patient states she provided updated insurance information at the time of her last visit with Dr. Berline Lopes on 05/29/21. Provided patient with phone number for [facility] billing [phone]. Insurance information on file is up to date [employer] and [employer]).  Patient inquiring what the cost of her upcoming surgery with Dr. Berline Lopes will be. Provided patient with CPT code [phone], instructed to contact her insurance company for the coverage and cost. Patient verbalized understanding. Instructed to call with any needs.

## 2021-08-07 ENCOUNTER — Telehealth: Payer: Self-pay

## 2021-08-07 NOTE — Telephone Encounter (Signed)
Returning call to Ms. Sarate to discuss surgical dates. Offered patient 12/29, 1/3, 1/4, 1/10, 1/11, 1/17 or 1/18. Patient would like to proceed with surgery on January 4th, 2023. Pre-op appointment scheduled with Joylene John, NP on 08/16/21 at 1:00 pm. Patient is in agreement of date and time of appointment. Instructed patient that she will be receiving a call from [facility] to schedule her pre-admissions appointment. Patient verbalized understanding. Instructed to call with any needs.

## 2021-08-07 NOTE — Telephone Encounter (Signed)
Received call from Monica Grant. Patient reports she called her insurance company (medicare) to follow up on coverage. Medicare advised patient that there is no pre-authorization required and it would be covered as long as the procedure is deemed medically necessary. Assured patient that once her surgery is scheduled our office will send her surgery for authorization.   Patient would like to proceed with scheduling her surgery now that her husband has recovered from his surgery. Patient would prefer something after the first of the year. Instructed patient that our office will be in touch with surgical dates.

## 2021-08-16 ENCOUNTER — Encounter: Payer: Self-pay | Admitting: Gynecologic Oncology

## 2021-08-16 ENCOUNTER — Inpatient Hospital Stay: Payer: Medicare Other | Attending: Gynecologic Oncology | Admitting: Gynecologic Oncology

## 2021-08-16 ENCOUNTER — Other Ambulatory Visit: Payer: Self-pay

## 2021-08-16 VITALS — BP 159/81 | HR 99 | Temp 97.3°F | Resp 16 | Ht 62.0 in | Wt 99.8 lb

## 2021-08-16 DIAGNOSIS — N83202 Unspecified ovarian cyst, left side: Secondary | ICD-10-CM

## 2021-08-16 MED ORDER — SENNOSIDES-DOCUSATE SODIUM 8.6-50 MG PO TABS: 2.0000 | ORAL_TABLET | Freq: Every day | ORAL | 0 refills | Status: DC

## 2021-08-16 MED ORDER — TRAMADOL HCL 50 MG PO TABS: 50.0000 mg | ORAL_TABLET | Freq: Four times a day (QID) | ORAL | 0 refills | Status: DC | PRN

## 2021-08-16 NOTE — Progress Notes (Signed)
Patient here with her husband for a pre-operative appointment prior to her scheduled surgery on August 23, 2021. She is scheduled for robotic assisted laparoscopic bilateral salpingo-oophorectomy, possible mini laparotomy, possible robotic assisted total laparoscopic hysterectomy, possible staging if a cancer is identified, possible laparotomy, excision of vulvar lesion if present.  She has her pre-admission testing appointment at [facility] on 08/18/2021. The surgery was discussed in detail.  See after visit summary for additional details. Visual aids used to discuss items related to surgery including sequential compression stockings, foley catheter, IV pump, multi-modal pain regimen including tylenol, photo of the surgical robot, female reproductive system to discuss surgery in detail.      Discussed post-op pain management in detail including the aspects of the enhanced recovery pathway.  Advised her that a new prescription would be sent in for tramadol and it is only to be used for after her upcoming surgery.  We discussed the use of tylenol post-op and to monitor for a maximum of 4,000 mg in a 24 hour period.  Also prescribed sennakot to be used after surgery and to hold if having loose stools.  Discussed bowel regimen in detail.     Discussed the use of SCDs and measures to take at home to prevent DVT including frequent mobility.  Reportable signs and symptoms of DVT discussed. Post-operative instructions discussed and expectations for after surgery. Incisional care discussed as well including reportable signs and symptoms including erythema, drainage, wound separation.     10 minutes spent with the patient.  Verbalizing understanding of material discussed. No needs or concerns voiced at the end of the visit.   Advised patient and family to call for any needs.  Advised that her post-operative medications had been prescribed and could be picked up at any time.    This appointment is included in the  global surgical bundle as pre-operative teaching and has no charge.

## 2021-08-16 NOTE — Patient Instructions (Addendum)
Preparing for your Surgery  Plan for surgery on August 23, 2021 with Dr. Jeral Pinch at [facility] will be scheduled for robotic assisted laparoscopic bilateral salpingo-oophorectomy (removal of both ovaries and fallopian tubes), possible mini laparotomy (slightly larger incision to remove the ovary), possible robotic assisted total laparoscopic hysterectomy (removal of the uterus and cervix), possible staging if a cancer is identified, possible laparotomy (larger incision on your abdomen if needed), excision of vulvar lesion.   Pre-operative Testing - (Scheduled for 12/30) You will receive a phone call from presurgical testing at [facility] to arrange for a pre-operative appointment and lab work.  -Bring your insurance card, copy of an advanced directive if applicable, medication list  -At that visit, you will be asked to sign a consent for a possible blood transfusion in case a transfusion becomes necessary during surgery.  The need for a blood transfusion is rare but having consent is a necessary part of your care.     -You should not be taking blood thinners or aspirin at least ten days prior to surgery unless instructed by your surgeon.  -Do not take supplements such as fish oil (omega 3), red yeast rice, turmeric before your surgery. You want to avoid medications with aspirin in them including headache powders such as BC or Goody's), Excedrin migraine.  Day Before Surgery at [place] will be asked to take in a light diet the day before surgery. You will be advised you can have clear liquids up until 3 hours before your surgery.    Eat a light diet the day before surgery.  Examples including soups, broths, toast, yogurt, mashed potatoes.  AVOID GAS PRODUCING FOODS. Things to avoid include carbonated beverages (fizzy beverages, sodas), raw fruits and raw vegetables (uncooked), or beans.   If your bowels are filled with gas, your surgeon will have difficulty  visualizing your pelvic organs which increases your surgical risks.  Your role in recovery Your role is to become active as soon as directed by your doctor, while still giving yourself time to heal.  Rest when you feel tired. You will be asked to do the following in order to speed your recovery:  - Cough and breathe deeply. This helps to clear and expand your lungs and can prevent pneumonia after surgery.  - [place]. Do mild physical activity. Walking or moving your legs help your circulation and body functions return to normal. Do not try to get up or walk alone the first time after surgery.   -If you develop swelling on one leg or the other, pain in the back of your leg, redness/warmth in one of your legs, please call the office or go to the Emergency Room to have a doppler to rule out a blood clot. For shortness of breath, chest pain-seek care in the Emergency Room as soon as possible. - Actively manage your pain. Managing your pain lets you move in comfort. We will ask you to rate your pain on a scale of zero to 10. It is your responsibility to tell your doctor or nurse where and how much you hurt so your pain can be treated.  Special Considerations -If you are diabetic, you may be placed on insulin after surgery to have closer control over your blood sugars to promote healing and recovery.  This does not mean that you will be discharged on insulin.  If applicable, your oral antidiabetics will be resumed when you are tolerating a  solid diet.  -Your final pathology results from surgery should be available around one week after surgery and the results will be relayed to you when available.  -Dr. Lahoma Crocker is the surgeon that assists your GYN Oncologist with surgery.  If you end up staying the night, the next day after your surgery you will either see Dr. Berline Lopes or Dr. Lahoma Crocker.  -FMLA forms can be faxed to [phone] and please allow 5-7 business days for  completion.  Pain Management After Surgery -You have been prescribed your pain medication and bowel regimen medications before surgery so that you can have these available when you are discharged from the hospital. The pain medication is for use ONLY AFTER surgery and a new prescription will not be given.   -Make sure that you have Tylenol and Ibuprofen at home to use on a regular basis after surgery for pain control. We recommend alternating the medications every hour to six hours since they work differently and are processed in the body differently for pain relief.  -Review the attached handout on narcotic use and their risks and side effects.   Bowel Regimen -You have been prescribed Sennakot-S to take nightly to prevent constipation especially if you are taking the narcotic pain medication intermittently.  It is important to prevent constipation and drink adequate amounts of liquids. You can stop taking this medication when you are not taking pain medication and you are back on your normal bowel routine.  Risks of Surgery Risks of surgery are low but include bleeding, infection, damage to surrounding structures, re-operation, blood clots, and very rarely death.   Blood Transfusion Information (For the consent to be signed before surgery)  We will be checking your blood type before surgery so in case of emergencies, we will know what type of blood you would need.                                            WHAT IS A BLOOD TRANSFUSION?  A transfusion is the replacement of blood or some of its parts. Blood is made up of multiple cells which provide different functions. Red blood cells carry oxygen and are used for blood loss replacement. White blood cells fight against infection. Platelets control bleeding. Plasma helps clot blood. Other blood products are available for specialized needs, such as hemophilia or other clotting disorders. BEFORE THE TRANSFUSION  Who gives blood for  transfusions?  You may be able to donate blood to be used at a later date on yourself (autologous donation). Relatives can be asked to donate blood. This is generally not any safer than if you have received blood from a stranger. The same precautions are taken to ensure safety when a relative's blood is donated. Healthy volunteers who are fully evaluated to make sure their blood is safe. This is blood bank blood. Transfusion therapy is the safest it has ever been in the practice of medicine. Before blood is taken from a donor, a complete history is taken to make sure that person has no history of diseases nor engages in risky social behavior (examples are intravenous drug use or sexual activity with multiple partners). The donor's travel history is screened to minimize risk of transmitting infections, such as malaria. The donated blood is tested for signs of infectious diseases, such as HIV and hepatitis. The blood is then tested to be sure  it is compatible with you in order to minimize the chance of a transfusion reaction. If you or a relative donates blood, this is often done in anticipation of surgery and is not appropriate for emergency situations. It takes many days to process the donated blood. RISKS AND COMPLICATIONS Although transfusion therapy is very safe and saves many lives, the main dangers of transfusion include:  Getting an infectious disease. Developing a transfusion reaction. This is an allergic reaction to something in the blood you were given. Every precaution is taken to prevent this. The decision to have a blood transfusion has been considered carefully by your caregiver before blood is given. Blood is not given unless the benefits outweigh the risks.  AFTER SURGERY INSTRUCTIONS  Return to work: 4-6 weeks if applicable  Activity: 1. Be up and out of the bed during the day.  Take a nap if needed.  You may walk up steps but be careful and use the hand rail.  Stair climbing will  tire you more than you think, you may need to stop part way and rest.   2. No lifting or straining for 6 weeks over 10 pounds. No pushing, pulling, straining for 6 weeks.  3. No driving for 1 week(s).  Do not drive if you are taking narcotic pain medicine and make sure that your reaction time has returned.   4. You can shower as soon as the next day after surgery. Shower daily.  Use your regular soap and water (not directly on the incision) and pat your incision(s) dry afterwards; don't rub.  No tub baths or submerging your body in water until cleared by your surgeon. If you have the soap that was given to you by pre-surgical testing that was used before surgery, you do not need to use it afterwards because this can irritate your incisions.   5. No sexual activity and nothing in the vagina for 4 weeks. IF YOU HAVE A HYSTERECTOMY, NOTHING IN THE VAGINA FOR 8 WEEKS.  6. You may experience a small amount of clear drainage from your incisions, which is normal.  If the drainage persists, increases, or changes color please call the office.  7. Do not use creams, lotions, or ointments such as neosporin on your incisions after surgery until advised by your surgeon because they can cause removal of the dermabond glue on your incisions.    8. You may experience vaginal spotting after surgery or around the 6-8 week mark from surgery when the stitches at the top of the vagina begin to dissolve (if you have a hysterectomy). The spotting is normal but if you experience heavy bleeding, call our office.  9. Take Tylenol or ibuprofen first for pain (take with food to limit GI upset, can use sparingly) and only use narcotic pain medication for severe pain not relieved by the Tylenol or Ibuprofen.  Monitor your Tylenol intake to a max of 4,000 mg in a 24 hour period. You can alternate these medications after surgery.  Diet: 1. Low sodium Heart Healthy Diet is recommended but you are cleared to resume your normal  (before surgery) diet after your procedure.  2. It is safe to use a laxative, such as Miralax or Colace, if you have difficulty moving your bowels. You have been prescribed Sennakot-S to take at bedtime every evening after surgery to keep bowel movements regular and to prevent constipation.    Wound Care: 1. Keep clean and dry.  Shower daily.  Reasons to call the  Doctor: Fever - Oral temperature greater than 100.4 degrees Fahrenheit Foul-smelling vaginal discharge Difficulty urinating Nausea and vomiting Increased pain at the site of the incision that is unrelieved with pain medicine. Difficulty breathing with or without chest pain New calf pain especially if only on one side Sudden, continuing increased vaginal bleeding with or without clots.   Contacts: For questions or concerns you should contact:  Dr. Jeral Pinch at [phone]  Joylene John, NP at [phone]  After Hours: call [phone] and have the GYN Oncologist paged/contacted (after 5 pm or on the weekends).  Messages sent via mychart are for non-urgent matters and are not responded to after hours so for urgent needs, please call the after hours number.

## 2021-08-17 NOTE — Patient Instructions (Addendum)
DUE TO COVID-19 ONLY ONE VISITOR IS ALLOWED TO COME WITH YOU AND STAY IN THE WAITING ROOM ONLY DURING PRE OP AND PROCEDURE.   **NO VISITORS ARE ALLOWED IN THE SHORT STAY AREA OR RECOVERY ROOM!!**       Your procedure is scheduled on: 08/23/21   Report to [facility] Main Entrance    Report to admitting at 10:15 AM   Call this number if you have problems the morning of surgery [phone]   Do not eat food :After Midnight.   May have liquids until 9:30 AM day of surgery  CLEAR LIQUID DIET  Foods Allowed                                                                     Foods Excluded  Water, Black Coffee and tea (no milk or creamer)           liquids that you cannot  Plain Jell-O in any flavor  (No red)                                    see through such as: Fruit ices (not with fruit pulp)                                            milk, soups, orange juice              Iced Popsicles (No red)                                               All solid food                                   Apple juices Sports drinks like Gatorade (No red) Lightly seasoned clear broth or consume(fat free) Sugar     The day of surgery:  Drink ONE (1) Pre-Surgery Clear Ensure by 9:30 am the morning of surgery. Drink in one sitting. Do not sip.  This drink was given to you during your hospital  pre-op appointment visit. Nothing else to drink after completing the  Pre-Surgery Clear Ensure.          If you have questions, please contact your surgeons office.     Oral Hygiene is also important to reduce your risk of infection.                                    Remember - BRUSH YOUR TEETH THE MORNING OF SURGERY WITH YOUR REGULAR TOOTHPASTE   Take these medicines the morning of surgery with A SIP OF WATER: Tylenol, Amlodipine, Lipitor  You may not have any metal on your body including hair pins, jewelry, and body piercing             Do not wear make-up,  lotions, powders, perfumes, or deodorant  Do not wear nail polish including gel and S&S, artificial/acrylic nails, or any other type of covering on natural nails including finger and toenails. If you have artificial nails, gel coating, etc. that needs to be removed by a nail salon please have this removed prior to surgery or surgery may need to be canceled/ delayed if the surgeon/ anesthesia feels like they are unable to be safely monitored.   Do not shave  48 hours prior to surgery.    Do not bring valuables to the hospital. Monica Grant.    Patients discharged on the day of surgery will not be allowed to drive home.   Special Instructions: Bring a copy of your healthcare power of attorney and living will documents         the day of surgery if you haven't scanned them before.              Please read over the following fact sheets you were given: IF YOU HAVE QUESTIONS ABOUT YOUR PRE-OP INSTRUCTIONS PLEASE CALL [facility] - Preparing for Surgery Before surgery, you can play an important role.  Because skin is not sterile, your skin needs to be as free of germs as possible.  You can reduce the number of germs on your skin by washing with CHG (chlorahexidine gluconate) soap before surgery.  CHG is an antiseptic cleaner which kills germs and bonds with the skin to continue killing germs even after washing. Please DO NOT use if you have an allergy to CHG or antibacterial soaps.  If your skin becomes reddened/irritated stop using the CHG and inform your nurse when you arrive at Short Stay. Do not shave (including legs and underarms) for at least 48 hours prior to the first CHG shower.  You may shave your face/neck.  Please follow these instructions carefully:  1.  Shower with CHG Soap the night before surgery and the  morning of surgery.  2.  If you choose to wash your hair, wash your hair first as usual with your normal   shampoo.  3.  After you shampoo, rinse your hair and body thoroughly to remove the shampoo.                             4.  Use CHG as you would any other liquid soap.  You can apply chg directly to the skin and wash.  Gently with a scrungie or clean washcloth.  5.  Apply the CHG Soap to your body ONLY FROM THE NECK DOWN.   Do   not use on face/ open                           Wound or open sores. Avoid contact with eyes, ears mouth and   genitals (private parts).                       Wash face,  Genitals (private parts) with your normal soap.             6.  Wash thoroughly, paying special attention to the area where your    surgery  will be performed.  7.  Thoroughly rinse your body with warm water from the neck down.  8.  DO NOT shower/wash with your normal soap after using and rinsing off the CHG Soap.                9.  Pat yourself dry with a clean towel.            10.  Wear clean pajamas.            11.  Place clean sheets on your bed the night of your first shower and do not  sleep with pets. Day of Surgery : Do not apply any lotions/deodorants the morning of surgery.  Please wear clean clothes to the hospital/surgery center.  FAILURE TO FOLLOW THESE INSTRUCTIONS MAY RESULT IN THE CANCELLATION OF YOUR SURGERY  PATIENT SIGNATURE_________________________________  NURSE SIGNATURE__________________________________  ________________________________________________________________________  WHAT IS A BLOOD TRANSFUSION? Blood Transfusion Information  A transfusion is the replacement of blood or some of its parts. Blood is made up of multiple cells which provide different functions. Red blood cells carry oxygen and are used for blood loss replacement. White blood cells fight against infection. Platelets control bleeding. Plasma helps clot blood. Other blood products are available for specialized needs, such as hemophilia or other clotting disorders. BEFORE THE TRANSFUSION  Who gives  blood for transfusions?  Healthy volunteers who are fully evaluated to make sure their blood is safe. This is blood bank blood. Transfusion therapy is the safest it has ever been in the practice of medicine. Before blood is taken from a donor, a complete history is taken to make sure that person has no history of diseases nor engages in risky social behavior (examples are intravenous drug use or sexual activity with multiple partners). The donor's travel history is screened to minimize risk of transmitting infections, such as malaria. The donated blood is tested for signs of infectious diseases, such as HIV and hepatitis. The blood is then tested to be sure it is compatible with you in order to minimize the chance of a transfusion reaction. If you or a relative donates blood, this is often done in anticipation of surgery and is not appropriate for emergency situations. It takes many days to process the donated blood. RISKS AND COMPLICATIONS Although transfusion therapy is very safe and saves many lives, the main dangers of transfusion include:  Getting an infectious disease. Developing a transfusion reaction. This is an allergic reaction to something in the blood you were given. Every precaution is taken to prevent this. The decision to have a blood transfusion has been considered carefully by your caregiver before blood is given. Blood is not given unless the benefits outweigh the risks. AFTER THE TRANSFUSION Right after receiving a blood transfusion, you will usually feel much better and more energetic. This is especially true if your red blood cells have gotten low (anemic). The transfusion raises the level of the red blood cells which carry oxygen, and this usually causes an energy increase. The nurse administering the transfusion will monitor you carefully for complications. HOME CARE INSTRUCTIONS  No special instructions are needed after a transfusion. You may find your energy is better. Speak with  your caregiver about any limitations on activity for underlying diseases you may have. SEEK MEDICAL CARE IF:  Your condition is not improving after your transfusion. You develop redness or irritation at the intravenous (IV)  site. SEEK IMMEDIATE MEDICAL CARE IF:  Any of the following symptoms occur over the next 12 hours: Shaking chills. You have a temperature by mouth above 102 F (38.9 C), not controlled by medicine. Chest, back, or muscle pain. People around you feel you are not acting correctly or are confused. Shortness of breath or difficulty breathing. Dizziness and fainting. You get a rash or develop hives. You have a decrease in urine output. Your urine turns a dark color or changes to pink, red, or brown. Any of the following symptoms occur over the next 10 days: You have a temperature by mouth above 102 F (38.9 C), not controlled by medicine. Shortness of breath. Weakness after normal activity. The white part of the eye turns yellow (jaundice). You have a decrease in the amount of urine or are urinating less often. Your urine turns a dark color or changes to pink, red, or brown. Document Released: 08/03/2000 Document Revised: 10/29/2011 Document Reviewed: 03/22/2008 [facility] Patient Information 2014 [place], [place].  _______________________________________________________________________

## 2021-08-17 NOTE — Progress Notes (Addendum)
COVID swab appointment: n/a  COVID Vaccine Completed: yes x4 Date COVID Vaccine completed: 11/06/19, 12/01/19 Has received booster: 06/10/20, 12/22/20 COVID vaccine manufacturer: Pfizer      Date of COVID positive in last 90 days: n/a  PCP - Betty Martinique, MD Cardiologist - Buford Dresser, MD  Chest x-ray - n/a EKG - 08/18/21 Epic/chart Stress Test - yes more than 10 years per pt ECHO - yes more than 5 years ago per pt Cardiac Cath - about 6 years ago per pt Pacemaker/ICD device last checked: n/a Spinal Cord Stimulator: n/a  Sleep Study - n/a CPAP -   Fasting Blood Sugar - n/a Checks Blood Sugar _____ times a day  Blood Thinner Instructions: n/a Aspirin Instructions: Last Dose:  Activity level: Can go up a flight of stairs and perform activities of daily living without stopping and without symptoms of chest pain or shortness of breath.      Anesthesia review:   Patient denies shortness of breath, fever, cough and chest pain at PAT appointment   Patient verbalized understanding of instructions that were given to them at the PAT appointment. Patient was also instructed that they will need to review over the PAT instructions again at home before surgery.

## 2021-08-18 ENCOUNTER — Encounter (HOSPITAL_COMMUNITY): Payer: Self-pay

## 2021-08-18 ENCOUNTER — Encounter (HOSPITAL_COMMUNITY)
Admission: RE | Admit: 2021-08-18 | Discharge: 2021-08-18 | Disposition: A | Discharge status: Home / Self Care | Payer: Medicare Other | Source: Ambulatory Visit | Attending: Gynecologic Oncology | Admitting: Gynecologic Oncology

## 2021-08-18 ENCOUNTER — Other Ambulatory Visit: Payer: Self-pay

## 2021-08-18 DIAGNOSIS — Z8041 Family history of malignant neoplasm of ovary: Secondary | ICD-10-CM | POA: Insufficient documentation

## 2021-08-18 DIAGNOSIS — Z01818 Encounter for other preprocedural examination: Secondary | ICD-10-CM | POA: Diagnosis present

## 2021-08-18 DIAGNOSIS — N83202 Unspecified ovarian cyst, left side: Secondary | ICD-10-CM | POA: Insufficient documentation

## 2021-08-18 HISTORY — DX: Dizziness and giddiness: R42

## 2021-08-18 HISTORY — DX: Age-related osteoporosis without current pathological fracture: M81.0

## 2021-08-18 LAB — CBC
HCT: 44.2 % (ref 36.0–46.0)
Hemoglobin: 14.6 g/dL (ref 12.0–15.0)
MCH: 31.8 pg (ref 26.0–34.0)
MCHC: 33 g/dL (ref 30.0–36.0)
MCV: 96.3 fL (ref 80.0–100.0)
Platelets: 196 10*3/uL (ref 150–400)
RBC: 4.59 MIL/uL (ref 3.87–5.11)
RDW: 13 % (ref 11.5–15.5)
WBC: 6.9 10*3/uL (ref 4.0–10.5)
nRBC: 0 % (ref 0.0–0.2)

## 2021-08-18 LAB — COMPREHENSIVE METABOLIC PANEL
ALT: 17 U/L (ref 0–44)
AST: 22 U/L (ref 15–41)
Albumin: 4.7 g/dL (ref 3.5–5.0)
Alkaline Phosphatase: 55 U/L (ref 38–126)
Anion gap: 8 (ref 5–15)
BUN: 15 mg/dL (ref 8–23)
CO2: 27 mmol/L (ref 22–32)
Calcium: 9.6 mg/dL (ref 8.9–10.3)
Chloride: 104 mmol/L (ref 98–111)
Creatinine, Ser: 0.65 mg/dL (ref 0.44–1.00)
GFR, Estimated: >60 mL/min (ref >60)
Glucose, Bld: 94 mg/dL (ref 70–99)
Potassium: 4 mmol/L (ref 3.5–5.1)
Sodium: 139 mmol/L (ref 135–145)
Total Bilirubin: 0.7 mg/dL (ref 0.3–1.2)
Total Protein: 7.7 g/dL (ref 6.5–8.1)

## 2021-08-22 ENCOUNTER — Telehealth: Payer: Self-pay

## 2021-08-22 NOTE — Telephone Encounter (Signed)
Telephone call to Monica Grant to check on pre-operative status.  Patient compliant with pre-operative instructions.  Reinforced nothing to eat after midnight. Clear liquids until 0930. Patient to arrive at 1015.  No questions or concerns voiced.  Instructed to call for any needs.

## 2021-08-22 NOTE — Anesthesia Preprocedure Evaluation (Addendum)
Anesthesia Evaluation  Patient identified by MRN, date of birth, ID band Patient awake    Reviewed: Allergy & Precautions, NPO status , Patient's Chart, lab work & pertinent test results  Airway Mallampati: I  TM Distance: >3 FB Neck ROM: Full    Dental no notable dental hx. (+) Implants, Dental Advisory Given, Teeth Intact   Pulmonary neg pulmonary ROS, former smoker,    Pulmonary exam normal breath sounds clear to auscultation       Cardiovascular hypertension, Pt. on medications Normal cardiovascular exam Rhythm:Regular Rate:Normal  8/17 TTE EF 65%   Neuro/Psych negative neurological ROS     GI/Hepatic   Endo/Other  negative endocrine ROS  Renal/GU Renal diseaseLab Results      Component                Value               Date                      CREATININE               0.65                08/18/2021                BUN                      15                  08/18/2021                     K                        4.0                 08/18/2021                Female GU complaint (ovarian cyst)     Musculoskeletal negative musculoskeletal ROS (+)   Abdominal   Peds  Hematology negative hematology ROS (+) Lab Results      Component                Value               Date                           HGB                      14.6                08/18/2021                HCT                      44.2                08/18/2021                    PLT                      196                 08/18/2021  Anesthesia Other Findings ALL: ASA, Codeine  Reproductive/Obstetrics                           Anesthesia Physical Anesthesia Plan  ASA: 3  Anesthesia Plan: General   Post-op Pain Management: Lidocaine infusion, Ketamine IV and Toradol IV (intra-op)   Induction: Intravenous  PONV Risk Score and Plan: 4 or greater and Treatment may vary due to age or medical condition,  Midazolam, Ondansetron and Dexamethasone  Airway Management Planned: Oral ETT  Additional Equipment: None  Intra-op Plan:   Post-operative Plan: Extubation in OR  Informed Consent: I have reviewed the patients History and Physical, chart, labs and discussed the procedure including the risks, benefits and alternatives for the proposed anesthesia with the patient or authorized representative who has indicated his/her understanding and acceptance.     Dental advisory given  Plan Discussed with: CRNA and Anesthesiologist  Anesthesia Plan Comments: (GA w Lidocaine infusion)       Anesthesia Quick Evaluation

## 2021-08-23 ENCOUNTER — Ambulatory Visit (HOSPITAL_COMMUNITY)
Admission: RE | Admit: 2021-08-23 | Discharge: 2021-08-23 | Disposition: A | Discharge status: Home / Self Care | Payer: Medicare Other | Attending: Gynecologic Oncology | Admitting: Gynecologic Oncology

## 2021-08-23 ENCOUNTER — Encounter (HOSPITAL_COMMUNITY)
Admission: RE | Disposition: A | Discharge status: Home / Self Care | Payer: Self-pay | Source: Home / Self Care | Attending: Gynecologic Oncology

## 2021-08-23 ENCOUNTER — Ambulatory Visit (HOSPITAL_COMMUNITY): Payer: Medicare Other | Admitting: Anesthesiology

## 2021-08-23 ENCOUNTER — Encounter (HOSPITAL_COMMUNITY): Payer: Self-pay | Admitting: Gynecologic Oncology

## 2021-08-23 DIAGNOSIS — D271 Benign neoplasm of left ovary: Secondary | ICD-10-CM | POA: Insufficient documentation

## 2021-08-23 DIAGNOSIS — N83201 Unspecified ovarian cyst, right side: Secondary | ICD-10-CM | POA: Diagnosis present

## 2021-08-23 DIAGNOSIS — N83202 Unspecified ovarian cyst, left side: Secondary | ICD-10-CM

## 2021-08-23 DIAGNOSIS — Z87891 Personal history of nicotine dependence: Secondary | ICD-10-CM | POA: Diagnosis not present

## 2021-08-23 DIAGNOSIS — Z8041 Family history of malignant neoplasm of ovary: Secondary | ICD-10-CM | POA: Diagnosis not present

## 2021-08-23 DIAGNOSIS — N750 Cyst of Bartholin's gland: Secondary | ICD-10-CM

## 2021-08-23 HISTORY — PX: ROBOTIC ASSISTED BILATERAL SALPINGO OOPHERECTOMY: SHX6078

## 2021-08-23 HISTORY — PX: VULVAR LESION REMOVAL: SHX5391

## 2021-08-23 LAB — ABO/RH: ABO/RH(D): B NEG

## 2021-08-23 LAB — TYPE AND SCREEN
ABO/RH(D): B NEG
Antibody Screen: NEGATIVE

## 2021-08-23 SURGERY — SALPINGO-OOPHORECTOMY, BILATERAL, ROBOT-ASSISTED
Anesthesia: General

## 2021-08-23 MED ORDER — ORAL CARE MOUTH RINSE: 15.0000 mL | Freq: Once | OROMUCOSAL | Status: AC

## 2021-08-23 MED ORDER — MIDAZOLAM HCL 2 MG/2ML IJ SOLN
INTRAMUSCULAR | Status: DC | PRN
  Administered 2021-08-23 (×2): 1 mg via INTRAVENOUS

## 2021-08-23 MED ORDER — CHLORHEXIDINE GLUCONATE 0.12 % MT SOLN
15.0000 mL | Freq: Once | OROMUCOSAL | Status: AC
  Administered 2021-08-23: 15 mL via OROMUCOSAL

## 2021-08-23 MED ORDER — DEXAMETHASONE SODIUM PHOSPHATE 10 MG/ML IJ SOLN
INTRAMUSCULAR | Status: DC | PRN
Start: 2021-08-23 — End: 2021-08-23
  Administered 2021-08-23: 4 mg via INTRAVENOUS

## 2021-08-23 MED ORDER — FENTANYL CITRATE (PF) 250 MCG/5ML IJ SOLN
INTRAMUSCULAR | Status: DC | PRN
Start: 2021-08-23 — End: 2021-08-23
  Administered 2021-08-23 (×3): 50 ug via INTRAVENOUS

## 2021-08-23 MED ORDER — PROPOFOL 10 MG/ML IV BOLUS
INTRAVENOUS | Status: DC | PRN
  Administered 2021-08-23: 70 mg via INTRAVENOUS

## 2021-08-23 MED ORDER — BUPIVACAINE HCL 0.25 % IJ SOLN
INTRAMUSCULAR | Status: DC | PRN
  Administered 2021-08-23: 30 mL

## 2021-08-23 MED ORDER — SUGAMMADEX SODIUM 200 MG/2ML IV SOLN
INTRAVENOUS | Status: DC | PRN
  Administered 2021-08-23: 200 mg via INTRAVENOUS

## 2021-08-23 MED ORDER — FENTANYL CITRATE (PF) 250 MCG/5ML IJ SOLN
INTRAMUSCULAR | Status: AC
  Filled 2021-08-23: qty 5

## 2021-08-23 MED ORDER — KETAMINE HCL 10 MG/ML IJ SOLN
INTRAMUSCULAR | Status: DC | PRN
  Administered 2021-08-23: 10 mg via INTRAVENOUS

## 2021-08-23 MED ORDER — ENSURE PRE-SURGERY PO LIQD
296.0000 mL | Freq: Once | ORAL | Status: DC
  Filled 2021-08-23: qty 296

## 2021-08-23 MED ORDER — STERILE WATER FOR IRRIGATION IR SOLN
Status: DC | PRN
  Administered 2021-08-23: 1000 mL

## 2021-08-23 MED ORDER — ONDANSETRON HCL 4 MG/2ML IJ SOLN
INTRAMUSCULAR | Status: DC | PRN
  Administered 2021-08-23: 4 mg via INTRAVENOUS

## 2021-08-23 MED ORDER — PROPOFOL 10 MG/ML IV BOLUS
INTRAVENOUS | Status: AC
  Filled 2021-08-23: qty 20

## 2021-08-23 MED ORDER — LACTATED RINGERS IV SOLN: INTRAVENOUS | Status: DC

## 2021-08-23 MED ORDER — KETOROLAC TROMETHAMINE 30 MG/ML IJ SOLN
INTRAMUSCULAR | Status: AC
  Filled 2021-08-23: qty 1

## 2021-08-23 MED ORDER — KETAMINE HCL 50 MG/5ML IJ SOSY
PREFILLED_SYRINGE | INTRAMUSCULAR | Status: AC
  Filled 2021-08-23: qty 5

## 2021-08-23 MED ORDER — KETOROLAC TROMETHAMINE 15 MG/ML IJ SOLN
INTRAMUSCULAR | Status: AC
  Administered 2021-08-23: 15 mg via INTRAVENOUS
  Filled 2021-08-23: qty 1

## 2021-08-23 MED ORDER — FENTANYL CITRATE PF 50 MCG/ML IJ SOSY
25.0000 ug | PREFILLED_SYRINGE | INTRAMUSCULAR | Status: DC | PRN
  Administered 2021-08-23: 25 ug via INTRAVENOUS

## 2021-08-23 MED ORDER — LIDOCAINE 20MG/ML (2%) 15 ML SYRINGE OPTIME
INTRAMUSCULAR | Status: DC | PRN
  Administered 2021-08-23: 1 mg/kg/h via INTRAVENOUS

## 2021-08-23 MED ORDER — FENTANYL CITRATE PF 50 MCG/ML IJ SOSY
PREFILLED_SYRINGE | INTRAMUSCULAR | Status: AC
  Administered 2021-08-23: 25 ug via INTRAVENOUS
  Filled 2021-08-23: qty 1

## 2021-08-23 MED ORDER — BUPIVACAINE HCL (PF) 0.25 % IJ SOLN
INTRAMUSCULAR | Status: AC
  Filled 2021-08-23: qty 30

## 2021-08-23 MED ORDER — ONDANSETRON HCL 4 MG/2ML IJ SOLN
4.0000 mg | Freq: Once | INTRAMUSCULAR | Status: AC | PRN
  Administered 2021-08-23: 4 mg via INTRAVENOUS

## 2021-08-23 MED ORDER — ROCURONIUM BROMIDE 10 MG/ML (PF) SYRINGE
PREFILLED_SYRINGE | INTRAVENOUS | Status: DC | PRN
  Administered 2021-08-23: 40 mg via INTRAVENOUS

## 2021-08-23 MED ORDER — PHENYLEPHRINE HCL (PRESSORS) 10 MG/ML IV SOLN
INTRAVENOUS | Status: AC
  Filled 2021-08-23: qty 1

## 2021-08-23 MED ORDER — HEPARIN SODIUM (PORCINE) 5000 UNIT/ML IJ SOLN
5000.0000 [IU] | INTRAMUSCULAR | Status: AC
  Administered 2021-08-23: 5000 [IU] via SUBCUTANEOUS
  Filled 2021-08-23: qty 1

## 2021-08-23 MED ORDER — STERILE WATER FOR INJECTION IJ SOLN: INTRAMUSCULAR | Status: DC | PRN

## 2021-08-23 MED ORDER — LIDOCAINE 2% (20 MG/ML) 5 ML SYRINGE
INTRAMUSCULAR | Status: DC | PRN
  Administered 2021-08-23: 60 mg via INTRAVENOUS

## 2021-08-23 MED ORDER — ACETAMINOPHEN 500 MG PO TABS
1000.0000 mg | ORAL_TABLET | ORAL | Status: AC
  Administered 2021-08-23: 500 mg via ORAL
  Filled 2021-08-23: qty 2

## 2021-08-23 MED ORDER — KETOROLAC TROMETHAMINE 15 MG/ML IJ SOLN: 15.0000 mg | Freq: Once | INTRAMUSCULAR | Status: AC | PRN

## 2021-08-23 MED ORDER — MIDAZOLAM HCL 2 MG/2ML IJ SOLN
INTRAMUSCULAR | Status: AC
  Filled 2021-08-23: qty 2

## 2021-08-23 MED ORDER — PHENYLEPHRINE HCL-NACL 20-0.9 MG/250ML-% IV SOLN
INTRAVENOUS | Status: DC | PRN
  Administered 2021-08-23: 15 ug/min via INTRAVENOUS

## 2021-08-23 MED ORDER — LACTATED RINGERS IR SOLN
Status: DC | PRN
  Administered 2021-08-23: 1000 mL

## 2021-08-23 MED ORDER — ONDANSETRON HCL 4 MG/2ML IJ SOLN
INTRAMUSCULAR | Status: AC
  Filled 2021-08-23: qty 2

## 2021-08-23 MED ORDER — DEXAMETHASONE SODIUM PHOSPHATE 4 MG/ML IJ SOLN: 4.0000 mg | INTRAMUSCULAR | Status: DC

## 2021-08-23 SURGICAL SUPPLY — 68 items
APPLICATOR SURGIFLO ENDO (HEMOSTASIS) IMPLANT
BAG COUNTER SPONGE SURGICOUNT (BAG) IMPLANT
BAG LAPAROSCOPIC 12 15 PORT 16 (BASKET) IMPLANT
BAG RETRIEVAL 12/15 (BASKET)
BLADE SURG SZ10 CARB STEEL (BLADE) IMPLANT
COVER BACK TABLE 60X90IN (DRAPES) ×3 IMPLANT
COVER TIP SHEARS 8 DVNC (MISCELLANEOUS) ×2 IMPLANT
COVER TIP SHEARS 8MM DA VINCI (MISCELLANEOUS) ×1
DECANTER SPIKE VIAL GLASS SM (MISCELLANEOUS) ×3 IMPLANT
DERMABOND ADVANCED (GAUZE/BANDAGES/DRESSINGS) ×2
DERMABOND ADVANCED .7 DNX12 (GAUZE/BANDAGES/DRESSINGS) ×2 IMPLANT
DRAPE ARM DVNC X/XI (DISPOSABLE) ×8 IMPLANT
DRAPE COLUMN DVNC XI (DISPOSABLE) ×2 IMPLANT
DRAPE DA VINCI XI ARM (DISPOSABLE) ×4
DRAPE DA VINCI XI COLUMN (DISPOSABLE) ×1
DRAPE SHEET LG 3/4 BI-LAMINATE (DRAPES) ×3 IMPLANT
DRAPE SURG IRRIG POUCH 19X23 (DRAPES) ×3 IMPLANT
DRSG OPSITE POSTOP 4X6 (GAUZE/BANDAGES/DRESSINGS) IMPLANT
DRSG OPSITE POSTOP 4X8 (GAUZE/BANDAGES/DRESSINGS) IMPLANT
ELECT PENCIL ROCKER SW 15FT (MISCELLANEOUS) ×1 IMPLANT
ELECT REM PT RETURN 15FT ADLT (MISCELLANEOUS) ×3 IMPLANT
GAUZE 4X4 16PLY ~~LOC~~+RFID DBL (SPONGE) ×3 IMPLANT
GLOVE SURG ENC MOIS LTX SZ6 (GLOVE) ×12 IMPLANT
GLOVE SURG ENC MOIS LTX SZ6.5 (GLOVE) ×6 IMPLANT
GOWN STRL REUS W/ TWL LRG LVL3 (GOWN DISPOSABLE) ×8 IMPLANT
GOWN STRL REUS W/TWL LRG LVL3 (GOWN DISPOSABLE) ×4
HOLDER FOLEY CATH W/STRAP (MISCELLANEOUS) IMPLANT
IRRIG SUCT STRYKERFLOW 2 WTIP (MISCELLANEOUS) ×3
IRRIGATION SUCT STRKRFLW 2 WTP (MISCELLANEOUS) ×2 IMPLANT
KIT PROCEDURE DA VINCI SI (MISCELLANEOUS)
KIT PROCEDURE DVNC SI (MISCELLANEOUS) IMPLANT
KIT TURNOVER KIT A (KITS) IMPLANT
MANIPULATOR ADVINCU DEL 3.0 PL (MISCELLANEOUS) IMPLANT
MANIPULATOR ADVINCU DEL 3.5 PL (MISCELLANEOUS) IMPLANT
MANIPULATOR UTERINE 4.5 ZUMI (MISCELLANEOUS) IMPLANT
NDL HYPO 21X1.5 SAFETY (NEEDLE) ×2 IMPLANT
NDL SPNL 18GX3.5 QUINCKE PK (NEEDLE) IMPLANT
NEEDLE HYPO 21X1.5 SAFETY (NEEDLE) ×3 IMPLANT
NEEDLE SPNL 18GX3.5 QUINCKE PK (NEEDLE) IMPLANT
OBTURATOR OPTICAL STANDARD 8MM (TROCAR) ×1
OBTURATOR OPTICAL STND 8 DVNC (TROCAR) ×2
OBTURATOR OPTICALSTD 8 DVNC (TROCAR) ×2 IMPLANT
PACK ROBOT GYN CUSTOM WL (TRAY / TRAY PROCEDURE) ×3 IMPLANT
PAD POSITIONING PINK XL (MISCELLANEOUS) ×3 IMPLANT
PORT ACCESS TROCAR AIRSEAL 12 (TROCAR) ×2 IMPLANT
PORT ACCESS TROCAR AIRSEAL 5M (TROCAR) ×1
POUCH SPECIMEN RETRIEVAL 10MM (ENDOMECHANICALS) ×1 IMPLANT
SCRUB EXIDINE 4% CHG 4OZ (MISCELLANEOUS) ×3 IMPLANT
SEAL CANN UNIV 5-8 DVNC XI (MISCELLANEOUS) ×8 IMPLANT
SEAL XI 5MM-8MM UNIVERSAL (MISCELLANEOUS) ×4
SET TRI-LUMEN FLTR TB AIRSEAL (TUBING) ×3 IMPLANT
SPONGE T-LAP 18X18 ~~LOC~~+RFID (SPONGE) IMPLANT
SURGIFLO W/THROMBIN 8M KIT (HEMOSTASIS) IMPLANT
SUT MNCRL AB 4-0 PS2 18 (SUTURE) IMPLANT
SUT PDS AB 1 TP1 96 (SUTURE) IMPLANT
SUT VIC AB 0 CT1 27 (SUTURE)
SUT VIC AB 0 CT1 27XBRD ANTBC (SUTURE) IMPLANT
SUT VIC AB 2-0 CT1 27 (SUTURE)
SUT VIC AB 2-0 CT1 TAPERPNT 27 (SUTURE) IMPLANT
SUT VIC AB 4-0 PS2 18 (SUTURE) ×6 IMPLANT
SYR 10ML LL (SYRINGE) IMPLANT
TOWEL OR NON WOVEN STRL DISP B (DISPOSABLE) IMPLANT
TRAP SPECIMEN MUCUS 40CC (MISCELLANEOUS) ×1 IMPLANT
TRAY FOLEY MTR SLVR 16FR STAT (SET/KITS/TRAYS/PACK) ×3 IMPLANT
TROCAR XCEL NON-BLD 5MMX100MML (ENDOMECHANICALS) IMPLANT
UNDERPAD 30X36 HEAVY ABSORB (UNDERPADS AND DIAPERS) ×6 IMPLANT
WATER STERILE IRR 1000ML POUR (IV SOLUTION) ×3 IMPLANT
YANKAUER SUCT BULB TIP 10FT TU (MISCELLANEOUS) IMPLANT

## 2021-08-23 NOTE — Transfer of Care (Signed)
Immediate Anesthesia Transfer of Care Note  Patient: Monica Grant  Procedure(s) Performed: XI ROBOTIC ASSISTED BILATERAL SALPINGO OOPHORECTOMY (Bilateral) VAGINAL LESION REMOVAL  Patient Location: PACU  Anesthesia Type:General  Level of Consciousness: sedated  Airway & Oxygen Therapy: Patient Spontanous Breathing and Patient connected to face mask oxygen  Post-op Assessment: Report given to RN and Post -op Vital signs reviewed and stable  Post vital signs: Reviewed and stable  Last Vitals:  Vitals Value Taken Time  BP 126/76 08/23/21 1513  Temp    Pulse 76 08/23/21 1515  Resp 17 08/23/21 1515  SpO2 100 % 08/23/21 1515  Vitals shown include unvalidated device data.  Last Pain:  Vitals:   08/23/21 1027  TempSrc:   PainSc: 0-No pain         Complications: No notable events documented.

## 2021-08-23 NOTE — Anesthesia Postprocedure Evaluation (Signed)
Anesthesia Post Note  Patient: Monica Grant  Procedure(s) Performed: XI ROBOTIC ASSISTED BILATERAL SALPINGO OOPHORECTOMY (Bilateral) VAGINAL LESION REMOVAL     Patient location during evaluation: PACU Anesthesia Type: General Level of consciousness: awake and alert Pain management: pain level controlled Vital Signs Assessment: post-procedure vital signs reviewed and stable Respiratory status: spontaneous breathing, nonlabored ventilation, respiratory function stable and patient connected to nasal cannula oxygen Cardiovascular status: blood pressure returned to baseline and stable Postop Assessment: no apparent nausea or vomiting Anesthetic complications: no   No notable events documented.  Last Vitals:  Vitals:   08/23/21 1615 08/23/21 1627  BP: 108/65 125/79  Pulse: 69 68  Resp: 19 20  Temp:  36.4 C  SpO2: 95% 98%    Last Pain:  Vitals:   08/23/21 1627  TempSrc:   PainSc: 0-No pain                 Barnet Glasgow

## 2021-08-23 NOTE — H&P (Signed)
GYNECOLOGIC ONCOLOGY H&P  Treatment History: 10/20/2019: The patient was initially seen in our clinic with a known left ovarian cyst since imaging in 2017 for kidney stone identified.  It was monitored in [place] where she lived previously with serial ultrasounds and remained stable.  [place] continued to have surveillance.  An ultrasound scan performed on Fabry 25th 2021 revealed a small anteverted uterus measuring 5.7 x 4.1 x 3.3 cm.  There was a small echogenic mass suspicious for a polyp measuring 9 x 6 mm with internal blood flow.  The right ovary contained a 9 mm simple follicle.  The left ovary contained a cystic mass measuring 5.1 x 4.3 x 3.3 cm with a 3 mm nodule noted along the periphery with positive blood flow.  There was moderate free fluid noted in the posterior cul-de-sac measuring 2.2 cm.  The patient had previously undergone a CT scan of the abdomen and pelvis on September 03, 2019 at [facility] to evaluate and rule out kidney stone.  It was a noncontrasted scan.  There were no stones or hydronephrosis identified.  There is no retroperitoneal or pelvic adenopathy.  There was no ascites or carcinomatosis noted on scan report.  There was an oval fluid collection to the left of the uterus measuring 4.6 cm and could represent either loculated fluid or adnexal cyst.  There is a small associated calcification in its posterior wall.  To the right of the uterus there was a more elliptical and probably free fluid collection. Patient was offered continued surveillance versus surgery and at that time opted for continued surveillance.   04/28/2020: Pelvic ultrasound shows no change in multiple small fibroids noted within the patient's uterus.  Right ovary with small simple 6 mm cyst, stable.  Left ovary with stable thin-walled complex cyst measuring 5 x 4 x 3 cm with inferior cyst wall nodule measuring 3 mm, no Doppler flow.   04/28/2021: Pelvic ultrasound at outside clinic  reported to show uterus measures 5.3 x 4.8 x 4.4 cm with an endometrial lining measuring 3.1 mm.  Right ovary atrophic with 7 mm follicle noted.  Left ovary with a 4.8 x 3.9 x 3 cm cyst and small avascular nodules.  No free fluid seen.   Component Ref Range & Units 3 wk ago 1 yr ago 3 yr ago 4 yr ago  CA 125 <35 U/mL 8  7 CM  8 CM  8 CM       Interval History: Patient presents today for discussion regarding possible surgical intervention.  She denies any vaginal bleeding or discharge.  She denies any pelvic or abdominal pain.  She has occasional constipation that is her baseline, for which she uses dietary changes to improve.  She denies any bladder symptoms.   She endorses a good appetite without nausea or emesis.  She denies any abdominal bloating or early satiety.   Since her last visit with [place], she has been diagnosed with osteoporosis and is now on Fosamax.  Her mother also passed away earlier this year.  She had a history of ovarian cancer initially treated in 2018.  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  She had genetic testing that the patient reports was negative.   Husband is present for much of the visit by phone.   PAST MEDICAL HISTORY: Past Medical History:  Diagnosis Date   Elevated CA 19-9 level  Hyperlipidemia    Hypertension    Kidney stones    Osteoporosis    Pancreatic cyst    Vertigo     PAST SURGICAL HISTORY: Past Surgical History:  Procedure Laterality Date   CARDIAC CATHETERIZATION     COLONOSCOPY      OB/GYN HISTORY: OB History  Gravida Para Term Preterm AB Living  2 2       2   SAB IAB Ectopic Multiple Live Births               # Outcome Date GA Lbr Len/2nd Weight Sex Delivery Anes PTL Lv  2 Para           1 Para            MEDICATIONS:  Current Facility-Administered Medications:    dexamethasone (DECADRON) injection 4 mg, 4 mg, Intravenous, On Call to OR, Cross, Melissa  D, NP   [START ON 08/24/2021] feeding supplement (ENSURE PRE-SURGERY) liquid 296 mL, 296 mL, Oral, Once, Cross, Melissa D, NP   lactated ringers infusion, , Intravenous, Continuous, Hatchett, Franklin, MD, Last Rate: 10 mL/hr at 08/23/21 1031, New Bag at 08/23/21 1031  ALLERGIES: Allergies  Allergen Reactions   Aspirin Other (See Comments)    GI upset   Codeine Other (See Comments)    GI upset    FAMILY HISTORY: Family History  Problem Relation Age of Onset   Ovarian cancer Mother    Lymphoma Mother    Pancreatic cancer Father 54       pancreatic   Cancer Sister        ? type   COPD Sister    Heart disease Sister    Mental illness Sister    Heart disease Sister    Heart disease Brother 67       MI   Heart disease Paternal Grandfather    Stomach cancer Neg Hx    Throat cancer Neg Hx     SOCIAL HISTORY: Social History   Socioeconomic History   Marital status: Married    Spouse name: Not on file   Number of children: 2   Years of education: Not on file   Highest education level: Not on file  Occupational History   Not on file  Tobacco Use   Smoking status: Former    Types: Cigarettes    Quit date: 04/04/1987    Years since quitting: 34.4   Smokeless tobacco: Never  Vaping Use   Vaping Use: Never used  Substance and Sexual Activity   Alcohol use: Not Currently   Drug use: No   Sexual activity: Yes    Partners: Male    Comment: 1st intercourse 21 yo-1 partner  Other Topics Concern   Not on file  Social History Narrative   Not on file   Social Determinants of Health   Financial Resource Strain: Not on file  Food Insecurity: Not on file  Transportation Needs: Not on file  Physical Activity: Not on file  Stress: Not on file  Social Connections: Not on file  Intimate Partner Violence: Not on file    REVIEW OF SYSTEMS: Denies appetite changes, fevers, chills, fatigue, unexplained weight changes. Denies hearing loss, neck lumps or masses, mouth sores,  ringing in ears or voice changes. Denies cough or wheezing.  Denies shortness of breath. Denies chest pain or palpitations. Denies leg swelling. Denies abdominal distention, pain, blood in stools, constipation, diarrhea, nausea, vomiting, or early satiety. Denies pain with intercourse, dysuria, frequency, hematuria  or incontinence. Denies hot flashes, pelvic pain, vaginal bleeding or vaginal discharge.   Denies joint pain, back pain or muscle pain/cramps. Denies itching, rash, or wounds. Denies dizziness, headaches, numbness or seizures. Denies swollen lymph nodes or glands, denies easy bruising or bleeding. Denies anxiety, depression, confusion, or decreased concentration.   PHYSICAL EXAM: BP (!) 167/97    Pulse (!) 109    Temp 98 F (36.7 C) (Oral)    Resp 15    Wt 101 lb 6.4 oz (46 kg)    SpO2 100%    BMI 18.55 kg/m  General: Alert, oriented, no acute distress. HEENT: Normal cephalic, atraumatic, sclera anicteric. Chest: Clear to auscultation bilaterally.  No wheezes or rhonchi. Cardiovascular: Regular rate and rhythm, no murmurs. Abdomen: soft, nontender.  Normoactive bowel sounds.  No masses or hepatosplenomegaly appreciated.   Extremities: Grossly normal range of motion.  Warm, well perfused.  No edema bilaterally.  LABORATORY AND RADIOLOGIC DATA: CA-125: 8  ASSESSMENT AND PLAN: Monica Grant is a 66 y.o. woman with family history of ovarian cancer who has been followed for the last 5-6 years for a left ovarian cyst, now with multiple avascular nodules on most recent imaging.   Plan for definitive surgery with Robotic BSO, staging or other procedures as indicated.   If any findings present at the time of surgery, will also plan for excision of vulvar lesion.   Jeral Pinch MD Gynecologic Oncology

## 2021-08-23 NOTE — Anesthesia Procedure Notes (Signed)
Procedure Name: Intubation Date/Time: 08/23/2021 1:24 PM Performed by: Cynda Familia, CRNA Pre-anesthesia Checklist: Patient identified, Emergency Drugs available, Suction available and Patient being monitored Patient Re-evaluated:Patient Re-evaluated prior to induction Oxygen Delivery Method: Circle System Utilized Preoxygenation: Pre-oxygenation with 100% oxygen Induction Type: IV induction Ventilation: Mask ventilation without difficulty Laryngoscope Size: Miller and 2 Grade View: Grade I Tube type: Oral Number of attempts: 1 Airway Equipment and Method: Stylet Placement Confirmation: ETT inserted through vocal cords under direct vision, positive ETCO2 and breath sounds checked- equal and bilateral Secured at: 20 cm Tube secured with: Tape Dental Injury: Teeth and Oropharynx as per pre-operative assessment  Comments: IV induction Houser- intubation AM CRNA atraumatic- teeth and mouth as preop- chipped right front tooth present prior to laryngoscopy--unchanged with intubation- bilat BS Houser

## 2021-08-23 NOTE — Op Note (Signed)
OPERATIVE NOTE  Pre-operative Diagnosis: Adnexal mass  Post-operative Diagnosis: same, benign cyst on frozen  Operation: Robotic-assisted laparoscopic bilateral salpingo-oophorectomy, excision of right vaginal mass   Surgeon: Jeral Pinch MD  Assistant Surgeon: Lahoma Crocker MD (an MD assistant was necessary for tissue manipulation, management of robotic instrumentation, retraction and positioning due to the complexity of the case and hospital policies).   Anesthesia: GET  Urine Output: 250cc  Operative Findings: On EUA, small mobile uterus, 4-5 cm mobile mass in the cul de sac. On intra-abdominal entry, normal upper abdomen. Normal omentum, small and large bowel. Uterus 6cm and normal appearing. Right adnexa normal appearing. Left ovary with smooth cyst replacing the ovary measuring 5cm. Normal fallopian tube. NO ascites. NO intra-abdominal or pelvic evidence of disease. 5 mm cystic lesion at distal right vagina, approximately 6-7 o'clock. Appearance consistent with sebaceous cyst. Frozen section consistent with benign cyst.  Estimated Blood Loss:  Minimal      Total IV Fluids: see I&O flowsheet         Specimens: bilateral tubes and ovaries, pelvic washings, right vaginal mass         Complications:  None apparent; patient tolerated the procedure well.         Disposition: PACU - hemodynamically stable.  Procedure Details  The patient was seen in the Holding Room. The risks, benefits, complications, treatment options, and expected outcomes were discussed with the patient.  The patient concurred with the proposed plan, giving informed consent.  The site of surgery properly noted/marked. The patient was identified as Monica Grant and the procedure verified as a Robotic-assisted bilateral salpingo-oophorectomy with any other indicated procedures.   After induction of anesthesia, the patient was draped and prepped in the usual sterile manner. Patient was placed in supine  position after anesthesia and draped and prepped in the usual sterile manner as follows: Her arms were tucked to her side with all appropriate precautions.  The shoulders were stabilized with padded shoulder blocks applied to the acromium processes.  The patient was placed in the semi-lithotomy position in [place].  The perineum and vagina were prepped with CholoraPrep. The patient was draped after the CholoraPrep had been allowed to dry for 3 minutes.  A Time Out was held and the above information confirmed.  The urethra was prepped with Betadine. Foley catheter was placed.  A sterile speculum was placed in the vagina.  The cervix was grasped with a single-tooth tenaculum. A Hulka was inserted for manipulation. An Donne Hazel was used to grasp the vaginal lesion and it was excised with metz from the underlying vagina; hemostasis was achieved with monopolar electrocautery.  OG tube placement was confirmed and to suction.   Next, a 10 mm skin incision was made 1 cm below the subcostal margin in the midclavicular line.  The 5 mm Optiview port and scope was used for direct entry.  Opening pressure was under 10 mm CO2.  The abdomen was insufflated and the findings were noted as above.   At this point and all points during the procedure, the patient's intra-abdominal pressure did not exceed 15 mmHg. Next, an 8 mm skin incision was made lateral to the umbilicus and a right and left port were placed about 8 cm lateral to the robot port on the right and left side.  The 5 mm assist trocar was exchanged for a 10-12 mm port. All ports were placed under direct visualization.  The patient was placed in steep Trendelenburg.  Bowel was  folded away into the upper abdomen.  The robot was docked in the normal manner.  The right and left peritoneum were opened parallel to the IP ligament to open the retroperitoneal spaces bilaterally. The round ligaments were preserved. The ureter was noted to be on the medial leaf of the broad  ligament.  The peritoneum above the ureter was incised and stretched and the infundibulopelvic ligament was skeletonized, cauterized and cut.  The utero-ovarian ligament and fallopian tube were skeletonized, cauterized and transected just lateral to the uterine fundus, freeing the adnexa. Bilateral adnexa were placed in an Endocatch bag. The bag was brought through the assist incision and adnexa were removed after contained cyst drainage of the left ovary. Both were sent for frozen section.  Irrigation was used and excellent hemostasis was achieved.  At this point in the procedure was completed.  Robotic instruments were removed under direct visulaization.  The robot was undocked. The fascia at the 10-12 mm port was closed with 0 Vicryl on a UR-5 needle.  The subcuticular tissue was closed with 4-0 Vicryl and the skin was closed with 4-0 Monocryl in a subcuticular manner.  Dermabond was applied.    The vagina was swabbed with minimal bleeding noted. Foley catheter was removed.  All sponge, lap and needle counts were correct x  3.   The patient was transferred to the recovery room in stable condition.  Jeral Pinch, MD

## 2021-08-23 NOTE — Discharge Instructions (Addendum)
08/23/2021  Return to work: 4-6 weeks  Today Dr. Berline Lopes removed both ovaries and fallopian tubes. On frozen section, the pathologist felt the ovaries were benign (not cancerous) preliminarily. We will contact you with the final report when available. She also removed a cyst at the entrance of the vagina.   Activity: 1. Be up and out of the bed during the day.  Take a nap if needed.  You may walk up steps but be careful and use the hand rail.  Stair climbing will tire you more than you think, you may need to stop part way and rest.   2. No lifting or straining, pushing, pulling, straining for 6 weeks.  3. Do Not drive if you are taking narcotic pain medicine and until you can brake safely.  4. Shower daily.  Use soap and water on your incision and pat dry; don't rub.   5. No sexual activity and nothing in the vagina for 4-6 weeks.  Medications:  - Take ibuprofen and tylenol first line for pain control. Take these regularly (every 6 hours) to decrease the build up of pain.  - If necessary, for severe pain not relieved by ibuprofen, take tramadol.  - While taking tramadol you should take sennakot every night to reduce the likelihood of constipation. If this causes diarrhea, stop its use.  Diet: 1. Low sodium Heart Healthy Diet is recommended.  2. It is safe to use a laxative if you have difficulty moving your bowels.   Wound Care: 1. Keep clean and dry.  Shower daily.  Reasons to call the Doctor:  Fever - Oral temperature greater than 100.4 degrees Fahrenheit Foul-smelling vaginal discharge Difficulty urinating Nausea and vomiting Increased pain at the site of the incision that is unrelieved with pain medicine. Difficulty breathing with or without chest pain New calf pain especially if only on one side Sudden, continuing increased vaginal bleeding with or without clots.   Follow-up: 1. See Jeral Pinch on 1/30.  Contacts: For questions or concerns you should  contact:  Dr. Jeral Pinch at [phone]  After hours and on week-ends call [phone] and ask to speak to the physician on call for Gynecologic Oncology

## 2021-08-23 NOTE — Anesthesia Procedure Notes (Signed)
Date/Time: 08/23/2021 3:08 PM Performed by: Cynda Familia, CRNA Oxygen Delivery Method: Simple face mask Placement Confirmation: positive ETCO2 and breath sounds checked- equal and bilateral Dental Injury: Teeth and Oropharynx as per pre-operative assessment

## 2021-08-24 ENCOUNTER — Encounter (HOSPITAL_COMMUNITY): Payer: Self-pay | Admitting: Gynecologic Oncology

## 2021-08-24 ENCOUNTER — Telehealth: Payer: Self-pay

## 2021-08-24 NOTE — Telephone Encounter (Signed)
Spoke with Ms. Lapierre this morning. She reports having vaginal burning with urination and nausea. Patient states she called the on-call number last night and was advised to take tramadol. Patient reports tramadol does help with discomfort but she still has some burning with urination and she has not voided much. Patient has not had much to drink and is working to increase her fluid intake. Advised patient to use a peri bottle when urinating to dilute urine. Instructed to call the office if symptoms persist or increase. Patient verbalized understanding.  She had half an egg and toast this morning. She is going to try to take a shower and then have some lunch. She has not had a BM yet but is passing gas. She is taking senokot as prescribed and encouraged her to drink plenty of water. She denies fever or chills. Incisions are dry and intact. She rates her pain 1/10. Her pain is controlled with tylenol and tramadol.    Instructed to call the office with any fever, chills, purulent drainage, uncontrolled pain or any other questions or concerns. Patient verbalizes understanding.   Pt aware of post op appointments as well as the office number [phone] and after hours number [phone] to call if she has any questions or concerns

## 2021-08-28 LAB — SURGICAL PATHOLOGY

## 2021-08-28 LAB — CYTOLOGY - NON PAP

## 2021-09-18 ENCOUNTER — Inpatient Hospital Stay: Payer: Medicare Other | Attending: Gynecologic Oncology | Admitting: Gynecologic Oncology

## 2021-09-18 ENCOUNTER — Encounter: Payer: Self-pay | Admitting: Gynecologic Oncology

## 2021-09-18 VITALS — BP 157/72 | HR 75 | Temp 97.7°F | Resp 16 | Ht 62.0 in | Wt 102.0 lb

## 2021-09-18 DIAGNOSIS — N83202 Unspecified ovarian cyst, left side: Secondary | ICD-10-CM

## 2021-09-18 DIAGNOSIS — Z90721 Acquired absence of ovaries, unilateral: Secondary | ICD-10-CM

## 2021-09-18 DIAGNOSIS — N9089 Other specified noninflammatory disorders of vulva and perineum: Secondary | ICD-10-CM

## 2021-09-18 NOTE — Progress Notes (Signed)
Gynecologic Oncology Return Clinic Visit  09/18/2021  Reason for Visit: Follow-up after surgery  Treatment History: 08/23/2021: Robotic BSO, excision of right vaginal mass  Interval History: Doing very well since surgery.  Denies any significant abdominal pain, has some twinges related to her incision.  Has some discomfort in her vaginal area although this is improving with time.  Notes small amount of vaginal discharge, denies any vaginal bleeding.  Reports regular bowel and bladder function.  Developed a rash in the distribution of her ChloraPrep a day or 2 after surgery, no significant pruritus associated with this.  Past Medical/Surgical History: Past Medical History:  Diagnosis Date   Elevated CA 19-9 level    Hyperlipidemia    Hypertension    Kidney stones    Osteoporosis    Pancreatic cyst    Vertigo     Past Surgical History:  Procedure Laterality Date   CARDIAC CATHETERIZATION     COLONOSCOPY     ROBOTIC ASSISTED BILATERAL SALPINGO OOPHERECTOMY Bilateral 08/23/2021   Procedure: XI ROBOTIC ASSISTED BILATERAL SALPINGO OOPHORECTOMY;  Surgeon: Lafonda Mosses, MD;  Location: [facility];  Service: Gynecology;  Laterality: Bilateral;   VULVAR LESION REMOVAL N/A 08/23/2021   Procedure: VAGINAL LESION REMOVAL;  Surgeon: Lafonda Mosses, MD;  Location: [facility];  Service: Gynecology;  Laterality: N/A;    Family History  Problem Relation Age of Onset   Ovarian cancer Mother    Lymphoma Mother    Pancreatic cancer Father 9       pancreatic   Cancer Sister        ? type   COPD Sister    Heart disease Sister    Mental illness Sister    Heart disease Sister    Heart disease Brother 42       MI   Heart disease Paternal Grandfather    Stomach cancer Neg Hx    Throat cancer Neg Hx     Social History   Socioeconomic History   Marital status: Married    Spouse name: Not on file   Number of children: 2   Years of education: Not on file   Highest education level: Not on  file  Occupational History   Not on file  Tobacco Use   Smoking status: Former    Types: Cigarettes    Quit date: 04/04/1987    Years since quitting: 34.4   Smokeless tobacco: Never  Vaping Use   Vaping Use: Never used  Substance and Sexual Activity   Alcohol use: Not Currently   Drug use: No   Sexual activity: Yes    Partners: Male    Comment: 1st intercourse 40 yo-1 partner  Other Topics Concern   Not on file  Social History Narrative   Not on file   Social Determinants of Health   Financial Resource Strain: Not on file  Food Insecurity: Not on file  Transportation Needs: Not on file  Physical Activity: Not on file  Stress: Not on file  Social Connections: Not on file    Current Medications:  Current Outpatient Medications:    acyclovir ointment (ZOVIRAX) 5 %, Apply 1 application topically every 3 (three) hours as needed., Disp: 30 g, Rfl: 1   alendronate (FOSAMAX) 70 MG tablet, Take 1 tablet (70 mg total) by mouth once a week. Take with a full glass of water on an empty stomach., Disp: 12 tablet, Rfl: 4   amLODipine (NORVASC) 2.5 MG tablet, Take 1 tablet (2.5 mg total)  by mouth daily. (Patient taking differently: Take 1.25 mg by mouth daily.), Disp: 90 tablet, Rfl: 3   atorvastatin (LIPITOR) 10 MG tablet, Take 1 tablet (10 mg total) by mouth daily., Disp: 90 tablet, Rfl: 3   Cholecalciferol (VITAMIN D3) 2000 units TABS, Take 2,000 Units by mouth daily., Disp: , Rfl:    acetaminophen (TYLENOL) 500 MG tablet, Take 500 mg by mouth daily as needed for moderate pain. (Patient not taking: Reported on 09/18/2021), Disp: , Rfl:    senna-docusate (SENOKOT-S) 8.6-50 MG tablet, Take 2 tablets by mouth at bedtime. For AFTER surgery, do not take if having diarrhea (Patient not taking: Reported on 09/18/2021), Disp: 30 tablet, Rfl: 0   traMADol (ULTRAM) 50 MG tablet, Take 1 tablet (50 mg total) by mouth every 6 (six) hours as needed for severe pain. For AFTER surgery, do not take and  drive (Patient not taking: Reported on 09/18/2021), Disp: 10 tablet, Rfl: 0  Review of Systems: Denies appetite changes, fevers, chills, fatigue, unexplained weight changes. Denies hearing loss, neck lumps or masses, mouth sores, ringing in ears or voice changes. Denies cough or wheezing.  Denies shortness of breath. Denies chest pain or palpitations. Denies leg swelling. Denies abdominal distention, pain, blood in stools, constipation, diarrhea, nausea, vomiting, or early satiety. Denies pain with intercourse, dysuria, frequency, hematuria or incontinence. Denies hot flashes, pelvic pain, vaginal bleeding.   Denies joint pain, back pain or muscle pain/cramps. Denies itching, rash, or wounds. Denies dizziness, headaches, numbness or seizures. Denies swollen lymph nodes or glands, denies easy bruising or bleeding. Denies anxiety, depression, confusion, or decreased concentration.  Physical Exam: BP (!) 157/72 (BP Location: Left Arm, Patient Position: Sitting)    Pulse 75    Temp 97.7 F (36.5 C) (Tympanic)    Resp 16    Ht 5\' 2"  (1.575 m)    Wt 102 lb (46.3 kg)    SpO2 100%    BMI 18.66 kg/m  General: Alert, oriented, no acute distress. HEENT: Normocephalic, atraumatic, sclera anicteric. Chest: Unlabored breathing on room air. Abdomen: soft, nontender.  Normoactive bowel sounds.  No masses or hepatosplenomegaly appreciated.  Well-healed incisions, remaining Dermabond removed.  Trace evidence of prior erythematous rash on patient's trunk. Extremities: Grossly normal range of motion.  Warm, well perfused.  No edema bilaterally.  GU: Normal appearing external genitalia without erythema, excoriation, or lesions.  Incision at the vaginal introitus is healing well.  Laboratory & Radiologic Studies: A. OVARY AND FALLOPIAN TUBE, LEFT, SALINGO OOPHORECTOMY:  Benign serous cystadenoma  Benign fallopian tube   B. OVARY AND FALLOPIAN TUBE, RIGHT, SALPINGO OOPHORECTOMY:  Benign simple cyst of  ovary  Benign fallopian tube   C. LABIAL MASS, RIGHT, EXCISION:  Compatible with benign Bartholin gland cyst  Assessment & Plan: Monica Grant is a 66 y.o. woman s/p robotic BSO as well as labial mass excision with benign final pathology.  Patient is overall doing well and meeting postoperative milestones.  In terms of her vaginal/vulvar symptoms, discussed doing sitz bath's once or twice a day for relief.  Reviewed continued postoperative restrictions as well as expectations.  She was given a copy of her final pathology report, which we reviewed again in detail together.  She is being released back to her OB/GYN.  24 minutes of total time was spent for this patient encounter, including preparation, face-to-face counseling with the patient and coordination of care, and documentation of the encounter.  Jeral Pinch, MD  Division of Gynecologic Oncology  Department of  Obstetrics and Gynecology  [facility]

## 2021-09-18 NOTE — Patient Instructions (Signed)
It was good to see you today.  You are healing well from surgery.  Remember, no heavy lifting for 6 weeks after surgery.    Please not hesitate to call me if you need anything in the future.

## 2021-10-02 ENCOUNTER — Other Ambulatory Visit: Payer: Self-pay | Admitting: Family Medicine

## 2021-10-02 DIAGNOSIS — I10 Essential (primary) hypertension: Secondary | ICD-10-CM

## 2021-10-02 DIAGNOSIS — E785 Hyperlipidemia, unspecified: Secondary | ICD-10-CM

## 2021-10-06 NOTE — Progress Notes (Signed)
66 y.o. G2P2 Married Caucasian female here for annual exam. Reports intermittent tenderness in vulvar area around where she had procedure on cyst on 08/23/21. Needs refills of Fosamax, which she is taking since January, 2022.  ? ?Pleased to have had her ovaries removed and get a benign result on pathology.  ?She had a Bartholin's gland cyst removed at the same time.  ? ?Taking Epsom salt baths for the soreness of her vulva. ? ?PCP: Betty Martinique, MD   ? ?No LMP recorded. Patient is postmenopausal.     ?  ?    ?Sexually active: No.  ?The current method of family planning is post menopausal status.    ?Exercising: Yes.     Walking 3x a week ?Smoker:  no ? ?Health Maintenance: ?Pap: 10/15/19-WNL, 09/24/19-unsatisfactory ?History of abnormal Pap:  no ?MMG: 07/19/20-birads 2 ?Colonoscopy: 2017 ?BMD: 06/2020  Result osteoporosis of hip, osteopenia of the spine ?TDaP: 01/14/18 ?Gardasil:   no ?HIV: 01/14/18 ?Hep C: unsure ?Screening Labs:  PCP ? ? reports that she quit smoking about 34 years ago. Her smoking use included cigarettes. She has never used smokeless tobacco. She reports that she does not currently use alcohol. She reports that she does not use drugs. ? ?Past Medical History:  ?Diagnosis Date  ? Elevated CA 19-9 level   ? Hyperlipidemia   ? Hypertension   ? Kidney stones   ? Osteoporosis   ? Pancreatic cyst   ? Vertigo   ? ? ?Past Surgical History:  ?Procedure Laterality Date  ? CARDIAC CATHETERIZATION    ? COLONOSCOPY    ? ROBOTIC ASSISTED BILATERAL SALPINGO OOPHERECTOMY Bilateral 08/23/2021  ? Procedure: XI ROBOTIC ASSISTED BILATERAL SALPINGO OOPHORECTOMY;  Surgeon: Lafonda Mosses, MD;  Location: [facility];  Service: Gynecology;  Laterality: Bilateral;  ? VULVAR LESION REMOVAL N/A 08/23/2021  ? Procedure: VAGINAL LESION REMOVAL;  Surgeon: Lafonda Mosses, MD;  Location: [facility];  Service: Gynecology;  Laterality: N/A;  ? ? ?Current Outpatient Medications  ?Medication Sig Dispense Refill  ? amLODipine (NORVASC) 2.5  MG tablet TAKE ONE TABLET BY MOUTH DAILY 90 tablet 3  ? atorvastatin (LIPITOR) 10 MG tablet TAKE ONE TABLET BY MOUTH DAILY 90 tablet 3  ? Cholecalciferol (VITAMIN D3) 2000 units TABS Take 2,000 Units by mouth daily.    ? acetaminophen (TYLENOL) 500 MG tablet Take 500 mg by mouth daily as needed for moderate pain. (Patient not taking: Reported on 10/18/2021)    ? acyclovir ointment (ZOVIRAX) 5 % Apply 1 application topically every 3 (three) hours as needed. (Patient not taking: Reported on 10/18/2021) 30 g 1  ? alendronate (FOSAMAX) 70 MG tablet Take 1 tablet (70 mg total) by mouth once a week. Take with a full glass of water on an empty stomach. 12 tablet 4  ? ?No current facility-administered medications for this visit.  ? ? ?Family History  ?Problem Relation Age of Onset  ? Ovarian cancer Mother   ? Lymphoma Mother   ? Pancreatic cancer Father 42  ?     pancreatic  ? Cancer Sister   ?     ? type  ? COPD Sister   ? Heart disease Sister   ? Mental illness Sister   ? Heart disease Sister   ? Pancreatic cancer Brother   ? Heart disease Brother 98  ?     MI  ? Heart disease Paternal Grandfather   ? Stomach cancer Neg Hx   ? Throat cancer Neg Hx   ? ? ?  Review of Systems  ?All other systems reviewed and are negative. ? ?Exam:   ?BP 118/82   Pulse 81   Ht 5' 2.5" (1.588 m)   Wt 103 lb (46.7 kg)   SpO2 96%   BMI 18.54 kg/m?     ?General appearance: alert, cooperative and appears stated age ?Head: normocephalic, without obvious abnormality, atraumatic ?Neck: no adenopathy, supple, symmetrical, trachea midline and thyroid normal to inspection and palpation ?Lungs: clear to auscultation bilaterally ?Breasts: normal appearance, no masses or tenderness, No nipple retraction or dimpling, No nipple discharge or bleeding, No axillary adenopathy ?Heart: regular rate and rhythm ?Abdomen: soft, non-tender; no masses, no organomegaly ?Extremities: extremities normal, atraumatic, no cyanosis or edema ?Skin: skin color, texture, turgor  normal. No rashes or lesions ?Lymph nodes: cervical, supraclavicular, and axillary nodes normal. ?Neurologic: grossly normal ? ?Pelvic: External genitalia:  no lesions ?             No abnormal inguinal nodes palpated. ?             Urethra:  normal appearing urethra with no masses, tenderness or lesions ?             Bartholins and Skenes: normal    ?             Vagina: normal appearing vagina with normal color and discharge, no lesions ?             Cervix: no lesions ?             Pap taken: Yes.   ?Bimanual Exam:  Uterus:  normal size, contour, position, consistency, mobility, non-tender ?             Adnexa: no mass, fullness, tenderness ?             Rectal exam: Yes.  .  Confirms. ?             Anus:  normal sphincter tone, no lesions ? ?Chaperone was present for exam:  yes.  ? ?Assessment:   ?Well woman visit with gynecologic exam.   ?Status post Robotic-assisted laparoscopic bilateral salpingo-oophorectomy, excision of right vaginal mass.  Benign serous cystadenoma of left ovary, simple cyst of right ovary, and Bartholin gland cyst.  ?Hx small uterine fibroid. ?Osteoporosis.  ?FH ovarian cancer in mother.  ?FH pancreatic cancer in father and brother.  ? ?Plan: ?Mammogram screening discussed. She will schedule at [place]. ?Self breast awareness reviewed. ?Pap and HR HPV collected. ?Guidelines for Calcium, Vitamin D, regular exercise program including cardiovascular and weight bearing exercise. ?Refill and Fosamax 70 mg weekly.   ?BMD at [place] in November, 2023.  ?Follow up annually and prn.  ? ?After visit summary provided.  ? ?29 min  total time was spent for this patient encounter, including preparation, face-to-face counseling with the patient, coordination of care, and documentation of the encounter. ? ? ? ? ?

## 2021-10-17 ENCOUNTER — Other Ambulatory Visit: Payer: Self-pay

## 2021-10-17 NOTE — Telephone Encounter (Signed)
Last AEX 10/11/20 with Dr. Delilah Shan. Scheduled AEX 10/18/21 with Dr. Quincy Simmonds.

## 2021-10-18 ENCOUNTER — Encounter: Payer: Self-pay | Admitting: Obstetrics and Gynecology

## 2021-10-18 ENCOUNTER — Ambulatory Visit (INDEPENDENT_AMBULATORY_CARE_PROVIDER_SITE_OTHER): Payer: Medicare Other | Admitting: Obstetrics and Gynecology

## 2021-10-18 ENCOUNTER — Other Ambulatory Visit: Payer: Self-pay

## 2021-10-18 ENCOUNTER — Other Ambulatory Visit (HOSPITAL_COMMUNITY)
Admission: RE | Admit: 2021-10-18 | Discharge: 2021-10-18 | Disposition: A | Discharge status: Home / Self Care | Payer: Medicare Other | Source: Ambulatory Visit | Attending: Obstetrics and Gynecology | Admitting: Obstetrics and Gynecology

## 2021-10-18 VITALS — BP 118/82 | HR 81 | Ht 62.5 in | Wt 103.0 lb

## 2021-10-18 DIAGNOSIS — Z01419 Encounter for gynecological examination (general) (routine) without abnormal findings: Secondary | ICD-10-CM | POA: Insufficient documentation

## 2021-10-18 DIAGNOSIS — Z1151 Encounter for screening for human papillomavirus (HPV): Secondary | ICD-10-CM | POA: Diagnosis not present

## 2021-10-18 DIAGNOSIS — Z9189 Other specified personal risk factors, not elsewhere classified: Secondary | ICD-10-CM

## 2021-10-18 DIAGNOSIS — Z124 Encounter for screening for malignant neoplasm of cervix: Secondary | ICD-10-CM | POA: Diagnosis not present

## 2021-10-18 DIAGNOSIS — M810 Age-related osteoporosis without current pathological fracture: Secondary | ICD-10-CM

## 2021-10-18 MED ORDER — ALENDRONATE SODIUM 70 MG PO TABS: 70.0000 mg | ORAL_TABLET | ORAL | 4 refills | Status: DC

## 2021-10-18 NOTE — Patient Instructions (Signed)
You went to [facility] on [address] for your mammogram and bone density.  ? ?EXERCISE AND DIET:  We recommended that you start or continue a regular exercise program for good health. Regular exercise means any activity that makes your heart beat faster and makes you sweat.  We recommend exercising at least 30 minutes per day at least 3 days a week, preferably 4 or 5.  We also recommend a diet low in fat and sugar.  Inactivity, poor dietary choices and obesity can cause diabetes, heart attack, stroke, and kidney damage, among others.   ? ?ALCOHOL AND SMOKING:  Women should limit their alcohol intake to no more than 7 drinks/beers/glasses of wine (combined, not each!) per week. Moderation of alcohol intake to this level decreases your risk of breast cancer and liver damage. And of course, no recreational drugs are part of a healthy lifestyle.  And absolutely no smoking or even second hand smoke. Most people know smoking can cause heart and lung diseases, but did you know it also contributes to weakening of your bones? Aging of your skin?  Yellowing of your teeth and nails? ? ?CALCIUM AND VITAMIN D:  Adequate intake of calcium and Vitamin D are recommended.  The recommendations for exact amounts of these supplements seem to change often, but generally speaking 600 mg of calcium (either carbonate or citrate) and 800 units of Vitamin D per day seems prudent. Certain women may benefit from higher intake of Vitamin D.  If you are among these women, your doctor will have told you during your visit.   ? ?PAP SMEARS:  Pap smears, to check for cervical cancer or precancers,  have traditionally been done yearly, although recent scientific advances have shown that most women can have pap smears less often.  However, every woman still should have a physical exam from her gynecologist every year. It will include a breast check, inspection of the vulva and vagina to check for abnormal growths or skin changes, a visual exam of  the cervix, and then an exam to evaluate the size and shape of the uterus and ovaries.  And after 66 years of age, a rectal exam is indicated to check for rectal cancers. We will also provide age appropriate advice regarding health maintenance, like when you should have certain vaccines, screening for sexually transmitted diseases, bone density testing, colonoscopy, mammograms, etc.  ? ?MAMMOGRAMS:  All women over 58 years old should have a yearly mammogram. Many facilities now offer a "3D" mammogram, which may cost around $50 extra out of pocket. If possible,  we recommend you accept the option to have the 3D mammogram performed.  It both reduces the number of women who will be called back for extra views which then turn out to be normal, and it is better than the routine mammogram at detecting truly abnormal areas.   ? ?COLONOSCOPY:  Colonoscopy to screen for colon cancer is recommended for all women at age 68.  We know, you hate the idea of the prep.  We agree, BUT, having colon cancer and not knowing it is worse!!  Colon cancer so often starts as a polyp that can be seen and removed at colonscopy, which can quite literally save your life!  And if your first colonoscopy is normal and you have no family history of colon cancer, most women don't have to have it again for 10 years.  Once every ten years, you can do something that may end up saving your life,  right?  We will be happy to help you get it scheduled when you are ready.  Be sure to check your insurance coverage so you understand how much it will cost.  It may be covered as a preventative service at no cost, but you should check your particular policy.   ? ? ? ? ?

## 2021-10-19 LAB — CYTOLOGY - PAP
Comment: NEGATIVE
Diagnosis: NEGATIVE
High risk HPV: NEGATIVE

## 2021-10-26 ENCOUNTER — Encounter: Payer: Self-pay | Admitting: Obstetrics and Gynecology

## 2021-10-27 ENCOUNTER — Encounter: Payer: Self-pay | Admitting: Obstetrics and Gynecology

## 2021-10-27 NOTE — Telephone Encounter (Signed)
Order placed on [place] desk to be signed.  ?

## 2021-10-27 NOTE — Telephone Encounter (Signed)
Please place order on my desk for BMD at [facility] in December, 2023.  ?

## 2021-11-14 ENCOUNTER — Ambulatory Visit (INDEPENDENT_AMBULATORY_CARE_PROVIDER_SITE_OTHER): Payer: Medicare Other

## 2021-11-14 VITALS — Ht 62.0 in | Wt 103.0 lb

## 2021-11-14 DIAGNOSIS — Z0000 Encounter for general adult medical examination without abnormal findings: Secondary | ICD-10-CM

## 2021-11-14 NOTE — Progress Notes (Signed)
?I connected with  Darral Dash today via telehealth video enabled device and verified that I am speaking with the correct person using two identifiers. ?  ?Location: ?Patient: home  ?Provider: work ? ?Persons participating in virtual visit: De, Libman LPN ? ?I discussed the limitations, risks, security and privacy concerns of performing an evaluation and management service by video and the availability of in person appointments. The patient expressed understanding and agreed to proceed. ?  ?Some vital signs may be absent or patient reported.  ?  ? ?Subjective:  ? Monica Grant is a 66 y.o. female who presents for an Initial Medicare Annual Wellness Visit. ? ?Review of Systems    ? ?Cardiac Risk Factors include: advanced age (>47mn, >>76women) ? ?   ?Objective:  ?  ?Today's Vitals  ? 11/14/21 1458  ?Weight: 103 lb (46.7 kg)  ?Height: '5\' 2"'$  (1.575 m)  ? ?Body mass index is 18.84 kg/m?. ? ? ?  11/14/2021  ?  3:04 PM 09/18/2021  ? 10:09 AM 08/18/2021  ?  9:15 AM 08/16/2021  ?  8:49 AM 10/20/2019  ? 12:15 PM  ?Advanced Directives  ?Does Patient Have a Medical Advance Directive? No No No No No  ?Would patient like information on creating a medical advance directive?  No - Patient declined Yes (MAU/Ambulatory/Procedural Areas - Information given) Yes (MAU/Ambulatory/Procedural Areas - Information given) No - Patient declined  ? ? ?Current Medications (verified) ?Outpatient Encounter Medications as of 11/14/2021  ?Medication Sig  ? acetaminophen (TYLENOL) 500 MG tablet Take 500 mg by mouth daily as needed for moderate pain.  ? acyclovir ointment (ZOVIRAX) 5 % Apply 1 application topically every 3 (three) hours as needed.  ? alendronate (FOSAMAX) 70 MG tablet Take 1 tablet (70 mg total) by mouth once a week. Take with a full glass of water on an empty stomach.  ? amLODipine (NORVASC) 2.5 MG tablet TAKE ONE TABLET BY MOUTH DAILY  ? atorvastatin (LIPITOR) 10 MG tablet TAKE ONE TABLET BY MOUTH DAILY  ?  Cholecalciferol (VITAMIN D3) 2000 units TABS Take 2,000 Units by mouth daily.  ? ?No facility-administered encounter medications on file as of 11/14/2021.  ? ? ?Allergies (verified) ?Aspirin, Codeine, and Other  ? ?History: ?Past Medical History:  ?Diagnosis Date  ? Elevated CA 19-9 level   ? Hyperlipidemia   ? Hypertension   ? Kidney stones   ? Osteoporosis   ? Pancreatic cyst   ? Vertigo   ? ?Past Surgical History:  ?Procedure Laterality Date  ? CARDIAC CATHETERIZATION    ? COLONOSCOPY    ? ROBOTIC ASSISTED BILATERAL SALPINGO OOPHERECTOMY Bilateral 08/23/2021  ? Procedure: XI ROBOTIC ASSISTED BILATERAL SALPINGO OOPHORECTOMY;  Surgeon: TLafonda Mosses MD;  Location: [facility];  Service: Gynecology;  Laterality: Bilateral;  ? VULVAR LESION REMOVAL N/A 08/23/2021  ? Procedure: VAGINAL LESION REMOVAL;  Surgeon: TLafonda Mosses MD;  Location: [facility];  Service: Gynecology;  Laterality: N/A;  ? ?Family History  ?Problem Relation Age of Onset  ? Ovarian cancer Mother   ? Lymphoma Mother   ? Pancreatic cancer Father 565 ?     pancreatic  ? Cancer Sister   ?     ? type  ? COPD Sister   ? Heart disease Sister   ? Mental illness Sister   ? Heart disease Sister   ? Pancreatic cancer Brother   ? Heart disease Brother 54 ?     MI  ? Heart  disease Paternal Grandfather   ? Stomach cancer Neg Hx   ? Throat cancer Neg Hx   ? ?Social History  ? ?Socioeconomic History  ? Marital status: Married  ?  Spouse name: Not on file  ? Number of children: 2  ? Years of education: Not on file  ? Highest education level: Not on file  ?Occupational History  ? Not on file  ?Tobacco Use  ? Smoking status: Former  ?  Types: Cigarettes  ?  Quit date: 04/04/1987  ?  Years since quitting: 34.6  ? Smokeless tobacco: Never  ?Vaping Use  ? Vaping Use: Never used  ?Substance and Sexual Activity  ? Alcohol use: Not Currently  ? Drug use: No  ? Sexual activity: Not Currently  ?  Partners: Male  ?  Comment: 1st intercourse 65 yo-1 partner  ?Other Topics  Concern  ? Not on file  ?Social History Narrative  ? Not on file  ? ?Social Determinants of Health  ? ?Financial Resource Strain: Low Risk   ? Difficulty of Paying Living Expenses: Not hard at all  ?Food Insecurity: No Food Insecurity  ? Worried About Charity fundraiser in the Last Year: Never true  ? Ran Out of Food in the Last Year: Never true  ?Transportation Needs: No Transportation Needs  ? Lack of Transportation (Medical): No  ? Lack of Transportation (Non-Medical): No  ?Physical Activity: Insufficiently Active  ? Days of Exercise per Week: 3 days  ? Minutes of Exercise per Session: 30 min  ?Stress: No Stress Concern Present  ? Feeling of Stress : Not at all  ?Social Connections: Not on file  ? ? ?Tobacco Counseling ?Counseling given: Not Answered ? ? ?Clinical Intake: ? ?Pre-visit preparation completed: Yes ? ?Pain : No/denies pain ? ?  ? ?Nutritional Status: BMI of 19-24  Normal ?Nutritional Risks: None ?Diabetes: No ? ?How often do you need to have someone help you when you read instructions, pamphlets, or other written materials from your doctor or pharmacy?: 1 - Never ?What is the last grade level you completed in school?: some college ? ?Diabetic? no ? ?Interpreter Needed?: No ? ?Information entered by :: NAllen LPN ? ? ?Activities of Daily Living ? ?  11/14/2021  ?  3:06 PM 11/10/2021  ?  2:01 PM  ?In your present state of health, do you have any difficulty performing the following activities:  ?Hearing? 0 0  ?Vision? 0 0  ?Difficulty concentrating or making decisions? 0 0  ?Walking or climbing stairs? 0 0  ?Dressing or bathing? 0 0  ?Doing errands, shopping? 0 0  ?Preparing Food and eating ? N N  ?Using the Toilet? N N  ?In the past six months, have you accidently leaked urine? Y Y  ?Comment with sneezes or coughing   ?Do you have problems with loss of bowel control? N N  ?Managing your Medications? N N  ?Managing your Finances? N N  ?Housekeeping or managing your Housekeeping? N N  ? ? ?Patient Care  Team: ?Martinique, Betty G, MD as PCP - General (Family Medicine) ?Buford Dresser, MD as PCP - Cardiology (Cardiology) ? ?Indicate any recent Medical Services you may have received from other than [facility] providers in the past year (date may be approximate). ? ?   ?Assessment:  ? This is a routine wellness examination for Draya. ? ?Hearing/Vision screen ?Vision Screening - Comments:: Regular eye exams, [facility], Dr. Valetta Close ? ?Dietary issues and exercise activities discussed: ?  Current Exercise Habits: Home exercise routine, Type of exercise: walking, Time (Minutes): 30, Frequency (Times/Week): 3, Weekly Exercise (Minutes/Week): 90 ? ? Goals Addressed   ? ?  ?  ?  ?  ? This Visit's Progress  ?  Patient Stated     ?  11/14/2021, increase stamina in cold months ?  ? ?  ? ?Depression Screen ? ?  11/14/2021  ?  3:06 PM 07/11/2021  ?  7:52 AM 07/05/2020  ?  7:22 AM 01/14/2018  ?  8:13 AM  ?PHQ 2/9 Scores  ?PHQ - 2 Score 0 0 0 0  ?  ?Fall Risk ? ?  11/14/2021  ?  3:06 PM 11/10/2021  ?  2:01 PM 07/09/2021  ?  4:39 PM  ?Fall Risk   ?Falls in the past year? 0 0 0  ?Number falls in past yr:  0   ?Injury with Fall?  0   ?Risk for fall due to : Medication side effect    ?Follow up Falls evaluation completed;Education provided;Falls prevention discussed    ? ? ?FALL RISK PREVENTION PERTAINING TO THE HOME: ? ?Any stairs in or around the home? Yes  ?If so, are there any without handrails? No  ?Home free of loose throw rugs in walkways, pet beds, electrical cords, etc? Yes  ?Adequate lighting in your home to reduce risk of falls? Yes  ? ?ASSISTIVE DEVICES UTILIZED TO PREVENT FALLS: ? ?Life alert? No  ?Use of a cane, walker or w/c? No  ?Grab bars in the bathroom? No  ?Shower chair or bench in shower? Yes  ?Elevated toilet seat or a handicapped toilet? No  ? ?TIMED UP AND GO: ? ?Was the test performed? No .  ? ? ? ? ?Cognitive Function: ?  ?  ?  ? ?Immunizations ?Immunization History  ?Administered Date(s) Administered  ?  Influenza,inj,Quad PF,6+ Mos 07/05/2020  ? Influenza-Unspecified 07/07/2021  ? Moderna Covid-19 Vaccine Bivalent Booster 36yr & up 06/06/2021  ? PFIZER(Purple Top)SARS-COV-2 Vaccination 11/06/2019, 12/01/2019, 06/10/2020, 05

## 2021-11-14 NOTE — Patient Instructions (Signed)
Ms. Sturgess , ?Thank you for taking time to come for your Medicare Wellness Visit. I appreciate your ongoing commitment to your health goals. Please review the following plan we discussed and let me know if I can assist you in the future.  ? ?Screening recommendations/referrals: ?Colonoscopy: completed 01/27/2016, due now ?Mammogram: completed 10/26/2021, due 10/28/2022 ?Bone Density: completed 07/19/2020, due 07/19/2022 ?Recommended yearly ophthalmology/optometry visit for glaucoma screening and checkup ?Recommended yearly dental visit for hygiene and checkup ? ?Vaccinations: ?Influenza vaccine: completed 07/07/2021, due next flu season ?Pneumococcal vaccine: due now ?Tdap vaccine: completed 01/14/2018, due 01/15/2028 ?Shingles vaccine: completed   ?Covid-19: 06/06/2021, 12/22/2020, 06/10/2020, 12/01/2019, 11/06/2019 ? ?Advanced directives: Advance directive discussed with you today.  ? ?Conditions/risks identified: none ? ?Next appointment: Follow up in one year for your annual wellness visit  ? ? ?Preventive Care 66 Years and Older, Female ?Preventive care refers to lifestyle choices and visits with your health care provider that can promote health and wellness. ?What does preventive care include? ?A yearly physical exam. This is also called an annual well check. ?Dental exams once or twice a year. ?Routine eye exams. Ask your health care provider how often you should have your eyes checked. ?Personal lifestyle choices, including: ?Daily care of your teeth and gums. ?Regular physical activity. ?Eating a healthy diet. ?Avoiding tobacco and drug use. ?Limiting alcohol use. ?Practicing safe sex. ?Taking low-dose aspirin every day. ?Taking vitamin and mineral supplements as recommended by your health care provider. ?What happens during an annual well check? ?The services and screenings done by your health care provider during your annual well check will depend on your age, overall health, lifestyle risk factors, and family  history of disease. ?Counseling  ?Your health care provider may ask you questions about your: ?Alcohol use. ?Tobacco use. ?Drug use. ?Emotional well-being. ?Home and relationship well-being. ?Sexual activity. ?Eating habits. ?History of falls. ?Memory and ability to understand (cognition). ?Work and work Statistician. ?Reproductive health. ?Screening  ?You may have the following tests or measurements: ?Height, weight, and BMI. ?Blood pressure. ?Lipid and cholesterol levels. These may be checked every 5 years, or more frequently if you are over 66 years old. ?Skin check. ?Lung cancer screening. You may have this screening every year starting at age 66 if you have a 30-pack-year history of smoking and currently smoke or have quit within the past 15 years. ?Fecal occult blood test (FOBT) of the stool. You may have this test every year starting at age 66. ?Flexible sigmoidoscopy or colonoscopy. You may have a sigmoidoscopy every 5 years or a colonoscopy every 10 years starting at age 66. ?Hepatitis C blood test. ?Hepatitis B blood test. ?Sexually transmitted disease (STD) testing. ?Diabetes screening. This is done by checking your blood sugar (glucose) after you have not eaten for a while (fasting). You may have this done every 1-3 years. ?Bone density scan. This is done to screen for osteoporosis. You may have this done starting at age 66. ?Mammogram. This may be done every 1-2 years. Talk to your health care provider about how often you should have regular mammograms. ?Talk with your health care provider about your test results, treatment options, and if necessary, the need for more tests. ?Vaccines  ?Your health care provider may recommend certain vaccines, such as: ?Influenza vaccine. This is recommended every year. ?Tetanus, diphtheria, and acellular pertussis (Tdap, Td) vaccine. You may need a Td booster every 10 years. ?Zoster vaccine. You may need this after age 66. ?Pneumococcal 13-valent conjugate (PCV13)  vaccine.  One dose is recommended after age 66. ?Pneumococcal polysaccharide (PPSV23) vaccine. One dose is recommended after age 66. ?Talk to your health care provider about which screenings and vaccines you need and how often you need them. ?This information is not intended to replace advice given to you by your health care provider. Make sure you discuss any questions you have with your health care provider. ?Document Released: 09/02/2015 Document Revised: 04/25/2016 Document Reviewed: 06/07/2015 ?Elsevier Interactive Patient Education ? 2017 [employer]. ? ?Fall Prevention in the Home ?Falls can cause injuries. They can happen to people of all ages. There are many things you can do to make your home safe and to help prevent falls. ?What can I do on the outside of my home? ?Regularly fix the edges of walkways and driveways and fix any cracks. ?Remove anything that might make you trip as you walk through a door, such as a raised step or threshold. ?Trim any bushes or trees on the path to your home. ?Use bright outdoor lighting. ?Clear any walking paths of anything that might make someone trip, such as rocks or tools. ?Regularly check to see if handrails are loose or broken. Make sure that both sides of any steps have handrails. ?Any raised decks and porches should have guardrails on the edges. ?Have any leaves, snow, or ice cleared regularly. ?Use sand or salt on walking paths during winter. ?Clean up any spills in your garage right away. This includes oil or grease spills. ?What can I do in the bathroom? ?Use night lights. ?Install grab bars by the toilet and in the tub and shower. Do not use towel bars as grab bars. ?Use non-skid mats or decals in the tub or shower. ?If you need to sit down in the shower, use a plastic, non-slip stool. ?Keep the floor dry. Clean up any water that spills on the floor as soon as it happens. ?Remove soap buildup in the tub or shower regularly. ?Attach bath mats securely with  double-sided non-slip rug tape. ?Do not have throw rugs and other things on the floor that can make you trip. ?What can I do in the bedroom? ?Use night lights. ?Make sure that you have a light by your bed that is easy to reach. ?Do not use any sheets or blankets that are too big for your bed. They should not hang down onto the floor. ?Have a firm chair that has side arms. You can use this for support while you get dressed. ?Do not have throw rugs and other things on the floor that can make you trip. ?What can I do in the kitchen? ?Clean up any spills right away. ?Avoid walking on wet floors. ?Keep items that you use a lot in easy-to-reach places. ?If you need to reach something above you, use a strong step stool that has a grab bar. ?Keep electrical cords out of the way. ?Do not use floor polish or wax that makes floors slippery. If you must use wax, use non-skid floor wax. ?Do not have throw rugs and other things on the floor that can make you trip. ?What can I do with my stairs? ?Do not leave any items on the stairs. ?Make sure that there are handrails on both sides of the stairs and use them. Fix handrails that are broken or loose. Make sure that handrails are as long as the stairways. ?Check any carpeting to make sure that it is firmly attached to the stairs. Fix any carpet that is loose or worn. ?  Avoid having throw rugs at the top or bottom of the stairs. If you do have throw rugs, attach them to the floor with carpet tape. ?Make sure that you have a light switch at the top of the stairs and the bottom of the stairs. If you do not have them, ask someone to add them for you. ?What else can I do to help prevent falls? ?Wear shoes that: ?Do not have high heels. ?Have rubber bottoms. ?Are comfortable and fit you well. ?Are closed at the toe. Do not wear sandals. ?If you use a stepladder: ?Make sure that it is fully opened. Do not climb a closed stepladder. ?Make sure that both sides of the stepladder are locked  into place. ?Ask someone to hold it for you, if possible. ?Clearly mark and make sure that you can see: ?Any grab bars or handrails. ?First and last steps. ?Where the edge of each step is. ?Use tools that help you move

## 2021-11-27 ENCOUNTER — Encounter: Payer: Self-pay | Admitting: Family Medicine

## 2021-11-27 ENCOUNTER — Telehealth (INDEPENDENT_AMBULATORY_CARE_PROVIDER_SITE_OTHER): Payer: Medicare Other | Admitting: Family Medicine

## 2021-11-27 VITALS — Temp 97.4°F | Ht 62.0 in

## 2021-11-27 DIAGNOSIS — J069 Acute upper respiratory infection, unspecified: Secondary | ICD-10-CM | POA: Diagnosis not present

## 2021-11-27 DIAGNOSIS — Z1211 Encounter for screening for malignant neoplasm of colon: Secondary | ICD-10-CM | POA: Diagnosis not present

## 2021-11-27 DIAGNOSIS — R051 Acute cough: Secondary | ICD-10-CM | POA: Diagnosis not present

## 2021-11-27 MED ORDER — BENZONATATE 100 MG PO CAPS
100.0000 mg | ORAL_CAPSULE | Freq: Two times a day (BID) | ORAL | 0 refills | Status: AC | PRN
Start: 2021-11-27 — End: 2021-12-07

## 2021-11-27 NOTE — Progress Notes (Signed)
Virtual Visit via Video Note ?I connected with Monica Grant on 11/27/21 by a video enabled telemedicine application and verified that I am speaking with the correct person using two identifiers. ? Location patient: home ?Location provider:work office ?Persons participating in the virtual visit: patient, provider ? ?I discussed the limitations of evaluation and management by telemedicine and the availability of in person appointments. The patient expressed understanding and agreed to proceed. ? ?Chief Complaint  ?Patient presents with  ? Nasal Congestion  ?   ?  ? Sore Throat  ? Fever  ? ? ?HPI: ?Monica Grant is a 66 yo female with hx of hypertension, pancreatic cyst, and hyperlipidemia complaining of 3 to 4 days of respiratory symptoms as described above. ?4 days ago she started with sore throat. Next day fatigue, nasal congestion, rhinorrhea, postnasal drainage, and epiphora. ?2 days ago she started with mild frontal pressure headache. ?Sunday "slight" temperature and nonproductive cough. ? ?Max temp 99.1 F. ?Negative for anosmia, ageusia, wheezing, dyspnea, CP, abdominal pain, nausea, vomiting, changes in bowel habits, urinary symptoms, or a skin rash. ? ?She has taking Coricidin. ?Negative for sick contacts or recent travel. ?COVID-19 vaccination completed. ? ?She is due for colonoscopy. ?She has seen Dr Loletha Carrow for pancreatic cyst. ?Colonoscopy report by Dr. Bea Graff Kuperschmit needed 01/27/2016. ?Normal terminal ileum ?Sigmoid diverticulosis ?6 mm cecal polyp removed with forceps. ?6 mm rectal polyp removed with forceps. ?All internal hemorrhoids reported. ?Recommendation was for repeat colonoscopy in 5 years. ?Pathology report not available. ? ?ROS: See pertinent positives and negatives per HPI. ? ?Past Medical History:  ?Diagnosis Date  ? Elevated CA 19-9 level   ? Hyperlipidemia   ? Hypertension   ? Kidney stones   ? Osteoporosis   ? Pancreatic cyst   ? Vertigo   ? ? ?Past Surgical History:  ?Procedure Laterality  Date  ? CARDIAC CATHETERIZATION    ? COLONOSCOPY    ? ROBOTIC ASSISTED BILATERAL SALPINGO OOPHERECTOMY Bilateral 08/23/2021  ? Procedure: XI ROBOTIC ASSISTED BILATERAL SALPINGO OOPHORECTOMY;  Surgeon: Lafonda Mosses, MD;  Location: [facility];  Service: Gynecology;  Laterality: Bilateral;  ? VULVAR LESION REMOVAL N/A 08/23/2021  ? Procedure: VAGINAL LESION REMOVAL;  Surgeon: Lafonda Mosses, MD;  Location: [facility];  Service: Gynecology;  Laterality: N/A;  ? ? ?Family History  ?Problem Relation Age of Onset  ? Ovarian cancer Mother   ? Lymphoma Mother   ? Pancreatic cancer Father 36  ?     pancreatic  ? Cancer Sister   ?     ? type  ? COPD Sister   ? Heart disease Sister   ? Mental illness Sister   ? Heart disease Sister   ? Pancreatic cancer Brother   ? Heart disease Brother 45  ?     MI  ? Heart disease Paternal Grandfather   ? Stomach cancer Neg Hx   ? Throat cancer Neg Hx   ? ? ?Social History  ? ?Socioeconomic History  ? Marital status: Married  ?  Spouse name: Not on file  ? Number of children: 2  ? Years of education: Not on file  ? Highest education level: Not on file  ?Occupational History  ? Not on file  ?Tobacco Use  ? Smoking status: Former  ?  Types: Cigarettes  ?  Quit date: 04/04/1987  ?  Years since quitting: 34.6  ? Smokeless tobacco: Never  ?Vaping Use  ? Vaping Use: Never used  ?Substance and Sexual Activity  ?  Alcohol use: Not Currently  ? Drug use: No  ? Sexual activity: Not Currently  ?  Partners: Male  ?  Comment: 1st intercourse 81 yo-1 partner  ?Other Topics Concern  ? Not on file  ?Social History Narrative  ? Not on file  ? ?Social Determinants of Health  ? ?Financial Resource Strain: Low Risk   ? Difficulty of Paying Living Expenses: Not hard at all  ?Food Insecurity: No Food Insecurity  ? Worried About Charity fundraiser in the Last Year: Never true  ? Ran Out of Food in the Last Year: Never true  ?Transportation Needs: No Transportation Needs  ? Lack of Transportation (Medical): No  ?  Lack of Transportation (Non-Medical): No  ?Physical Activity: Insufficiently Active  ? Days of Exercise per Week: 3 days  ? Minutes of Exercise per Session: 30 min  ?Stress: No Stress Concern Present  ? Feeling of Stress : Not at all  ?Social Connections: Not on file  ?Intimate Partner Violence: Not on file  ? ?Current Outpatient Medications:  ?  acetaminophen (TYLENOL) 500 MG tablet, Take 500 mg by mouth daily as needed for moderate pain., Disp: , Rfl:  ?  acyclovir ointment (ZOVIRAX) 5 %, Apply 1 application topically every 3 (three) hours as needed., Disp: 30 g, Rfl: 1 ?  alendronate (FOSAMAX) 70 MG tablet, Take 1 tablet (70 mg total) by mouth once a week. Take with a full glass of water on an empty stomach., Disp: 12 tablet, Rfl: 4 ?  amLODipine (NORVASC) 2.5 MG tablet, TAKE ONE TABLET BY MOUTH DAILY, Disp: 90 tablet, Rfl: 3 ?  atorvastatin (LIPITOR) 10 MG tablet, TAKE ONE TABLET BY MOUTH DAILY, Disp: 90 tablet, Rfl: 3 ?  benzonatate (TESSALON) 100 MG capsule, Take 1 capsule (100 mg total) by mouth 2 (two) times daily as needed for up to 10 days for cough., Disp: 20 capsule, Rfl: 0 ?  Cholecalciferol (VITAMIN D3) 2000 units TABS, Take 2,000 Units by mouth daily., Disp: , Rfl:  ? ?EXAM: ? ?VITALS per patient if applicable:Ht '5\' 2"'$  (1.575 m)   BMI 18.84 kg/m?  ? ?GENERAL: alert, oriented, appears well and in no acute distress ? ?HEENT: atraumatic, conjunctiva clear, no obvious abnormalities on inspection of external nose and ears ? ?NECK: normal movements of the head and neck ? ?LUNGS: on inspection no signs of respiratory distress, breathing rate appears normal, no obvious gross SOB, gasping or wheezing ? ?CV: no obvious cyanosis ? ?MS: moves all visible extremities without noticeable abnormality ? ?PSYCH/NEURO: pleasant and cooperative, no obvious depression or anxiety, speech and thought processing grossly intact ? ?ASSESSMENT AND PLAN: ? ?Discussed the following assessment and plan: ? ?URI, acute ?Symptoms  suggests a viral etiology. ?Symptomatic treatment recommended. ?Instructed to perform another home COVID-19 test, called her 15 min after visit and reported as negative. ? ?Instructed to monitor for signs of complications, including new onset of fever among some, clearly instructed about warning signs. ?Throat lozenges, Tylenol 500 mg 3-4 times per day as needed, nasal saline irrigations as needed, and rest. ?F/U as needed. ? ?Acute cough - Plan: benzonatate (TESSALON) 100 MG capsule ?Explained that cough and congestion can last a few more days and even weeks after acute symptoms have resolved. ?Benzonatate for symptomatic treatment. ?Adequate hydration. ? ?Colon cancer screening - Plan: Ambulatory referral to Gastroenterology ? ?We discussed possible serious and likely etiologies, options for evaluation and workup, limitations of telemedicine visit vs in person visit, treatment, treatment risks  and precautions. ?The patient was advised to call back or seek an in-person evaluation if the symptoms worsen or if the condition fails to improve as anticipated. ?I discussed the assessment and treatment plan with the patient. The patient was provided an opportunity to ask questions and all were answered. The patient agreed with the plan and demonstrated an understanding of the instructions. ? ?No follow-ups on file. ?Caysen Whang G. Martinique, MD ? ?[place]. ?[place] office. ? ? ? ? ?

## 2021-11-28 ENCOUNTER — Telehealth: Payer: Self-pay | Admitting: Family Medicine

## 2021-11-28 NOTE — Telephone Encounter (Signed)
99.5 F is not equivalent to 100 F. ?Continue monitoring temp and for new symptoms. ?Tylenol 500 mg 3-4 times per day as needed. ?Thanks, ?BJ ?

## 2021-11-28 NOTE — Telephone Encounter (Signed)
Patient called because she was instructed to call office if temperature reached 100 degrees. Patient states that her normal body temperature is 97.5 and she is getting a reading of 99.5. She wants to know if this is the equivalent of 100 since her normal temperature is lower. ? ? ?Good callback number is [phone] ? ? ? ? ?Please advise  ?

## 2021-11-29 NOTE — Telephone Encounter (Signed)
I called and spoke with patient. We went over the information below & she verbalized understanding. Her temperature is back to normal this morning. She will let us know if anything changes.  ?

## 2022-01-05 ENCOUNTER — Encounter: Payer: Self-pay | Admitting: Family

## 2022-01-05 ENCOUNTER — Ambulatory Visit (INDEPENDENT_AMBULATORY_CARE_PROVIDER_SITE_OTHER): Payer: Medicare Other | Admitting: Family

## 2022-01-05 VITALS — BP 160/96 | HR 90 | Temp 97.9°F | Ht 62.0 in | Wt 102.1 lb

## 2022-01-05 DIAGNOSIS — R42 Dizziness and giddiness: Secondary | ICD-10-CM

## 2022-01-05 MED ORDER — MECLIZINE HCL 12.5 MG PO TABS: 12.5000 mg | ORAL_TABLET | Freq: Three times a day (TID) | ORAL | 0 refills | Status: DC | PRN

## 2022-01-05 NOTE — Progress Notes (Signed)
Subjective:     Patient ID: Monica Grant, female    DOB: [birth year], 66 y.o.   MRN: [id number]  Chief Complaint  Patient presents with   Dizziness    Pt c/o dizziness and lightheadedness since 5/16 on and off. Pt states she has some left ear pain last night.    HPI: Dizziness:  describes more lightheadedness. BP high today, reports some white coat syndrome, on low dose of Amlodipine, checks BP at home and usually normal. Denies any nausea. Reports having recent sinus symptoms with ear pain yesterday.  Assessment & Plan:   Problem List Items Addressed This Visit   None Visit Diagnoses     Dizziness    -  Primary   BP high today, advised rechecking at home, discussed normal parameters, f/u with PCP. Sending Meclizine, advised on use & SE. Advised on proper daily water hydration, not skipping meals.  Relevant Medications   meclizine (ANTIVERT) 12.5 MG tablet      Outpatient Medications Prior to Visit  Medication Sig Dispense Refill   acetaminophen (TYLENOL) 500 MG tablet Take 500 mg by mouth daily as needed for moderate pain.     acyclovir ointment (ZOVIRAX) 5 % Apply 1 application topically every 3 (three) hours as needed. 30 g 1   alendronate (FOSAMAX) 70 MG tablet Take 1 tablet (70 mg total) by mouth once a week. Take with a full glass of water on an empty stomach. 12 tablet 4   amLODipine (NORVASC) 2.5 MG tablet TAKE ONE TABLET BY MOUTH DAILY 90 tablet 3   atorvastatin (LIPITOR) 10 MG tablet TAKE ONE TABLET BY MOUTH DAILY 90 tablet 3   Cholecalciferol (VITAMIN D3) 2000 units TABS Take 2,000 Units by mouth daily.     No facility-administered medications prior to visit.    Past Medical History:  Diagnosis Date   Elevated CA 19-9 level    Hyperlipidemia    Hypertension    Kidney stones    Osteoporosis    Pancreatic cyst    Vertigo     Past Surgical History:  Procedure Laterality Date   CARDIAC CATHETERIZATION     COLONOSCOPY     ROBOTIC ASSISTED BILATERAL  SALPINGO OOPHERECTOMY Bilateral 08/23/2021   Procedure: XI ROBOTIC ASSISTED BILATERAL SALPINGO OOPHORECTOMY;  Surgeon: Lafonda Mosses, MD;  Location: [facility];  Service: Gynecology;  Laterality: Bilateral;   VULVAR LESION REMOVAL N/A 08/23/2021   Procedure: VAGINAL LESION REMOVAL;  Surgeon: Lafonda Mosses, MD;  Location: [facility];  Service: Gynecology;  Laterality: N/A;    Allergies  Allergen Reactions   Aspirin Other (See Comments)    GI upset   Codeine Other (See Comments)    GI upset   Other     Rash to ChloraPrep       Objective:    Physical Exam Vitals and nursing note reviewed.  Constitutional:      Appearance: Normal appearance.  HENT:     Right Ear: Tympanic membrane and ear canal normal.     Left Ear: Tympanic membrane and ear canal normal.     Mouth/Throat:     Mouth: Mucous membranes are moist.     Pharynx: No pharyngeal swelling, oropharyngeal exudate, posterior oropharyngeal erythema or uvula swelling.  Cardiovascular:     Rate and Rhythm: Normal rate and regular rhythm.  Pulmonary:     Effort: Pulmonary effort is normal.     Breath sounds: Normal breath sounds.  Musculoskeletal:  General: Normal range of motion.  Skin:    General: Skin is warm and dry.  Neurological:     Mental Status: She is alert.  Psychiatric:        Mood and Affect: Mood normal.        Behavior: Behavior normal.    BP (!) 160/96 (BP Location: Left Arm, Patient Position: Sitting, Cuff Size: Large)   Pulse 90   Temp 97.9 F (36.6 C) (Temporal)   Ht '5\' 2"'$  (1.575 m)   Wt 102 lb 2 oz (46.3 kg)   SpO2 99%   BMI 18.68 kg/m  Wt Readings from Last 3 Encounters:  01/05/22 102 lb 2 oz (46.3 kg)  11/14/21 103 lb (46.7 kg)  10/18/21 103 lb (46.7 kg)        Meds ordered this encounter  Medications   meclizine (ANTIVERT) 12.5 MG tablet    Sig: Take 1 tablet (12.5 mg total) by mouth 3 (three) times daily as needed for dizziness.    Dispense:  30 tablet    Refill:  0     Order Specific Question:   Supervising Provider    Answer:   ANDY, CAMILLE L [[id number]]    Jeanie Sewer, NP

## 2022-01-05 NOTE — Patient Instructions (Signed)
It was very nice to see you today!  Your blood pressure is elevated today. This could be causing your symptoms, check when you get home again, and prior to taking your medicine.   If you consistently are seeing readings > 140/90 I would let your primary care provider know.  I also sent in Meclizine which is for vertigo symptoms, and a reminder this can cause dizziness, so do not plan to drive after taking.     PLEASE NOTE:  If you had any lab tests please let us know if you have not heard back within a few days. You may see your results on MyChart before we have a chance to review them but we will give you a call once they are reviewed by [place]. If we ordered any referrals today, please let us know if you have not heard from their office within the next week.

## 2022-01-11 ENCOUNTER — Ambulatory Visit (INDEPENDENT_AMBULATORY_CARE_PROVIDER_SITE_OTHER): Payer: Medicare Other | Admitting: Gastroenterology

## 2022-01-11 ENCOUNTER — Encounter: Payer: Self-pay | Admitting: Gastroenterology

## 2022-01-11 VITALS — BP 120/70 | HR 77 | Ht 62.0 in | Wt 101.4 lb

## 2022-01-11 DIAGNOSIS — Z86010 Personal history of colonic polyps: Secondary | ICD-10-CM

## 2022-01-11 DIAGNOSIS — K862 Cyst of pancreas: Secondary | ICD-10-CM

## 2022-01-11 MED ORDER — METOCLOPRAMIDE HCL 5 MG PO TABS: ORAL_TABLET | ORAL | 0 refills | Status: DC

## 2022-01-11 MED ORDER — PLENVU 140 G PO SOLR: 140.0000 g | ORAL | 0 refills | Status: DC

## 2022-01-11 NOTE — Patient Instructions (Signed)
If you are age 66 or older, your body mass index should be between 23-30. Your Body mass index is 18.55 kg/m. If this is out of the aforementioned range listed, please consider follow up with your Primary Care Provider.  If you are age 63 or younger, your body mass index should be between 19-25. Your Body mass index is 18.55 kg/m. If this is out of the aformentioned range listed, please consider follow up with your Primary Care Provider.   ________________________________________________________  The [facility] providers would like to encourage you to use [facility] to communicate with providers for non-urgent requests or questions.  Due to long hold times on the telephone, sending your provider a message by [facility] may be a faster and more efficient way to get a response.  Please allow 48 business hours for a response.  Please remember that this is for non-urgent requests.  _______________________________________________________  Monica Grant have been scheduled for a colonoscopy. Please follow written instructions given to you at your visit today.  Please pick up your prep supplies at the pharmacy within the next 1-3 days. If you use inhalers (even only as needed), please bring them with you on the day of your procedure.  Due to recent changes in healthcare laws, you may see the results of your imaging and laboratory studies on MyChart before your provider has had a chance to review them.  We understand that in some cases there may be results that are confusing or concerning to you. Not all laboratory results come back in the same time frame and the provider may be waiting for multiple results in order to interpret others.  Please give [place] 48 hours in order for your provider to thoroughly review all the results before contacting the office for clarification of your results.   You have been scheduled for an MRI at [facility] on 01-23-2022. Your appointment time is 3pm. Please arrive to admitting (at  main entrance of the hospital) 15 minutes prior to your appointment time for registration purposes. Please make certain not to have anything to eat or drink 6 hours prior to your test. In addition, if you have any metal in your body, have a pacemaker or defibrillator, please be sure to let your ordering physician know. This test typically takes 45 minutes to 1 hour to complete. Should you need to reschedule, please call [phone] to do so.    It was a pleasure to see you today!  Thank you for trusting me with your gastrointestinal care!

## 2022-01-11 NOTE — Progress Notes (Signed)
[facility] Consult Note:  History: Monica Grant 01/11/2022  Referring provider: Martinique, Betty G, MD  Reason for consult/chief complaint: Pancreatic Cyst  (Follow -up on hx Pancreatic Cyst. Pt states that she believes that it was recommended that she have a repeat MRI. ) and Colonoscopy (PCP referred patient to have screening colonoscopy. Last Colonoscopy was 01/2016. )   Subjective  HPI: Monica Grant was referred by primary care to follow-up issues after being seen here in October 2019. From my original office consult note: "Report from MRI abdomen/MRCP dated 12/02/2015 at [facility] radiology in [place]:   No gallstones.  No intra-or extrahepatic biliary dilatation.  CBD 2 mm without filling defects. Pancreatic duct was not dilated (99m).  No evidence of pancreatic divisum. 4 mm cystic structure in pancreatic body with connection to main pancreatic duct, consistent with sidebranch IPMN     Colonoscopy report by Dr. DBea GraffKuperschmit needed 01/27/2016, indication screening Normal terminal ileum Sigmoid diverticulosis 6 mm cecal polyp removed with forceps. 6 mm rectal polyp removed with forceps. All internal hemorrhoids reported. Recommendation was for repeat colonoscopy in 5 years, no pathology included with these records."  Record request made to get pathology results, but further records not rec'd until today. __________________________________________  Monica Balhas been feeling well from a digestive standpoint.  Bowel habits are regular without rectal bleeding.  Denies chronic nausea vomiting, early satiety or dysphagia. She is naturally concerned about her family history of pancreatic cancer.  Her mother had ovarian cancer and JLeafyalso underwent an ovarian cystectomy several months ago with fortunately benign results.   ROS:  Review of Systems  Constitutional:  Negative for appetite change and unexpected weight change.  HENT:  Negative for mouth  sores and voice change.   Eyes:  Negative for pain and redness.  Respiratory:  Negative for cough and shortness of breath.   Cardiovascular:  Negative for chest pain and palpitations.  Genitourinary:  Negative for dysuria and hematuria.  Musculoskeletal:  Negative for arthralgias and myalgias.  Skin:  Negative for pallor and rash.  Neurological:  Positive for dizziness. Negative for weakness and headaches.  Hematological:  Negative for adenopathy.    Past Medical History: Past Medical History:  Diagnosis Date   Elevated CA 19-9 level    Hyperlipidemia    Hypertension    Kidney stones    Osteoporosis    Pancreatic cyst    Vertigo      Past Surgical History: Past Surgical History:  Procedure Laterality Date   CARDIAC CATHETERIZATION     COLONOSCOPY     ROBOTIC ASSISTED BILATERAL SALPINGO OOPHERECTOMY Bilateral 08/23/2021   Procedure: XI ROBOTIC ASSISTED BILATERAL SALPINGO OOPHORECTOMY;  Surgeon: TLafonda Mosses MD;  Location: [facility];  Service: Gynecology;  Laterality: Bilateral;   VULVAR LESION REMOVAL N/A 08/23/2021   Procedure: VAGINAL LESION REMOVAL;  Surgeon: TLafonda Mosses MD;  Location: [facility];  Service: Gynecology;  Laterality: N/A;     Family History: Family History  Problem Relation Age of Onset   Ovarian cancer Mother    Lymphoma Mother    Pancreatic cancer Father 554      pancreatic   Cancer Sister        ? type   COPD Sister    Heart disease Sister    Mental illness Sister    Heart disease Sister    Pancreatic cancer Brother    Heart disease Brother 548      MI   Heart  disease Paternal Grandfather    Stomach cancer Neg Hx    Throat cancer Neg Hx    Father died from pancreatic cancer Brother has stage 4 pancreatic cancer  Social History: Social History   Socioeconomic History   Marital status: Married    Spouse name: Not on file   Number of children: 2   Years of education: Not on file   Highest education level: Not on file   Occupational History   Not on file  Tobacco Use   Smoking status: Former    Types: Cigarettes    Quit date: 04/04/1987    Years since quitting: 34.7   Smokeless tobacco: Never  Vaping Use   Vaping Use: Never used  Substance and Sexual Activity   Alcohol use: Not Currently   Drug use: No   Sexual activity: Not Currently    Partners: Male    Comment: 1st intercourse 48 yo-1 partner  Other Topics Concern   Not on file  Social History Narrative   Not on file   Social Determinants of Health   Financial Resource Strain: Low Risk    Difficulty of Paying Living Expenses: Not hard at all  Food Insecurity: No Food Insecurity   Worried About Charity fundraiser in the Last Year: Never true   [place] in the Last Year: Never true  Transportation Needs: No Transportation Needs   Lack of Transportation (Medical): No   Lack of Transportation (Non-Medical): No  Physical Activity: Insufficiently Active   Days of Exercise per Week: 3 days   Minutes of Exercise per Session: 30 min  Stress: No Stress Concern Present   Feeling of Stress : Not at all  Social Connections: Not on file    Allergies: Allergies  Allergen Reactions   Aspirin Other (See Comments)    GI upset   Codeine Other (See Comments)    GI upset   Other     Rash to ChloraPrep    Outpatient Meds: Current Outpatient Medications  Medication Sig Dispense Refill   acetaminophen (TYLENOL) 500 MG tablet Take 500 mg by mouth daily as needed for moderate pain.     acyclovir ointment (ZOVIRAX) 5 % Apply 1 application topically every 3 (three) hours as needed. 30 g 1   alendronate (FOSAMAX) 70 MG tablet Take 1 tablet (70 mg total) by mouth once a week. Take with a full glass of water on an empty stomach. 12 tablet 4   amLODipine (NORVASC) 2.5 MG tablet TAKE ONE TABLET BY MOUTH DAILY 90 tablet 3   atorvastatin (LIPITOR) 10 MG tablet TAKE ONE TABLET BY MOUTH DAILY 90 tablet 3   Cholecalciferol (VITAMIN D3) 2000 units  TABS Take 2,000 Units by mouth daily.     meclizine (ANTIVERT) 12.5 MG tablet Take 1 tablet (12.5 mg total) by mouth 3 (three) times daily as needed for dizziness. 30 tablet 0   No current facility-administered medications for this visit.      ___________________________________________________________________ Objective   Exam:  BP 120/70   Pulse 77   Ht '5\' 2"'$  (1.575 m)   Wt 101 lb 6.4 oz (46 kg)   BMI 18.55 kg/m  Wt Readings from Last 3 Encounters:  01/11/22 101 lb 6.4 oz (46 kg)  01/05/22 102 lb 2 oz (46.3 kg)  11/14/21 103 lb (46.7 kg)   Petite and thin General: Well-appearing, ambulatory, pleasant and conversational Eyes: sclera anicteric, no redness ENT: oral mucosa moist without lesions, no cervical or  supraclavicular lymphadenopathy CV: RRR without murmur, S1/S2, no JVD, no peripheral edema Resp: clear to auscultation bilaterally, normal RR and effort noted GI: soft, no tenderness, with active bowel sounds. No guarding or palpable organomegaly noted. Skin; warm and dry, no rash or jaundice noted Neuro: awake, alert and oriented x 3. Normal gross motor function and fluent speech  Labs:  Cecal polyp tubular adenoma Rectal polyp hyperplastic polyp (June 2017)  CA 19-9 26 (nml) Nov 2022     Latest Ref Rng & Units 08/18/2021    9:42 AM 07/11/2021    8:32 AM 07/05/2020    8:14 AM  CMP  Glucose 70 - 99 mg/dL 94   87   96    BUN 8 - 23 mg/dL '15   13   16    '$ Creatinine 0.44 - 1.00 mg/dL 0.65   0.69   0.69    Sodium 135 - 145 mmol/L 139   139   139    Potassium 3.5 - 5.1 mmol/L 4.0   3.5   4.5    Chloride 98 - 111 mmol/L 104   101   102    CO2 22 - 32 mmol/L '27   29   29    '$ Calcium 8.9 - 10.3 mg/dL 9.6   9.8   10.1    Total Protein 6.5 - 8.1 g/dL 7.7   7.9   7.5    Total Bilirubin 0.3 - 1.2 mg/dL 0.7   0.6   0.8    Alkaline Phos 38 - 126 U/L 55   66     AST 15 - 41 U/L '22   20   20    '$ ALT 0 - 44 U/L '17   13   16        '$ Latest Ref Rng & Units 08/18/2021     9:42 AM 04/11/2018   11:01 AM 11/08/2015   12:00 AM  CBC  WBC 4.0 - 10.5 K/uL 6.9   5.1   5.6       Hemoglobin 12.0 - 15.0 g/dL 14.6   15.5   14.7       Hematocrit 36.0 - 46.0 % 44.2   46.4   43       Platelets 150 - 400 K/uL 196   225   225          This result is from an external source.   Radiology: CLINICAL DATA:  Followup cystic pancreatic lesion.   EXAM: MRI ABDOMEN WITHOUT AND WITH CONTRAST (INCLUDING MRCP)   TECHNIQUE: Multiplanar multisequence MR imaging of the abdomen was performed both before and after the administration of intravenous contrast. Heavily T2-weighted images of the biliary and pancreatic ducts were obtained, and three-dimensional MRCP images were rendered by post processing.   CONTRAST:  4 cc Gadavist   COMPARISON:  Outside MRI from [facility] in [place] dated 12/02/2015   FINDINGS: Lower chest: The lung bases are clear. No pleural or pericardial effusion. No pulmonary lesions are identified.   Hepatobiliary: No focal hepatic lesions or intrahepatic biliary dilatation. The gallbladder is normal. No common bile duct dilatation. Normal caliber and course of the common bile duct.   Pancreas: Normal appearance of the main pancreatic duct. Normal caliber and course. As demonstrated on the outside MRI examination there is a small cystic lesion closely associated with the main pancreatic duct. This measures a maximum of 6.0 x 4.5 mm. No septations, nodularity or enhancement are identified. This is  most likely a small side-branch IPMN.   There is also a small cyst in the pancreatic tail measuring a maximum of 4 mm. This appears stable also. It could be a small postinflammatory cyst or a side-branch IPMN.   Spleen:  Normal size.  No focal lesions.   Adrenals/Urinary Tract:  The adrenal glands and kidneys are normal.   Stomach/Bowel: Visualized portions within the abdomen are unremarkable.   Vascular/Lymphatic: No pathologically enlarged  lymph nodes identified. No abdominal aortic aneurysm demonstrated.   Other:  No ascites or abdominal wall hernia.   Musculoskeletal: No significant bony findings.   IMPRESSION: 1. Stable small cystic lesions in the pancreatic head and tail since prior outside MRI from 2017. No worrisome MR imaging features. These could be small postinflammatory cysts or side-branch IPMNs. Followup MR examination in 2 years is suggested. 2. No acute abdominal findings or lymphadenopathy.     Electronically Signed   By: Marijo Sanes M.D.   On: 06/19/2018 13:35    Assessment: Encounter Diagnoses  Name Primary?   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  Family history of pancreatic cancer, radiographic surveillance warranted  Personal history of adenomatous colon polyps.  1 subcentimeter adenomatous polyp 6 years ago, due for surveillance colonoscopy  Plan: She was agreeable to colonoscopy after discussion of procedure and risks.  The benefits and risks of the planned procedure were described in detail with the patient or (when appropriate) their health care proxy.  Risks were outlined as including, but not limited to, bleeding, infection, perforation, adverse medication reaction leading to cardiac or pulmonary decompensation, pancreatitis (if ERCP).  The limitation of incomplete mucosal visualization was also discussed.  No guarantees or warranties were given.   MRI pancreas with and without contrast ordered.  Thank you for the courtesy of this consult.  Please call me with any questions or concerns.  Nelida Meuse III  CC: Referring provider noted above

## 2022-01-23 ENCOUNTER — Other Ambulatory Visit: Payer: Self-pay | Admitting: Gastroenterology

## 2022-01-23 ENCOUNTER — Ambulatory Visit (HOSPITAL_COMMUNITY)
Admission: RE | Admit: 2022-01-23 | Discharge: 2022-01-23 | Disposition: A | Discharge status: Home / Self Care | Payer: Medicare Other | Source: Ambulatory Visit | Attending: Gastroenterology | Admitting: Gastroenterology

## 2022-01-23 DIAGNOSIS — Z86010 Personal history of colonic polyps: Secondary | ICD-10-CM

## 2022-01-23 DIAGNOSIS — K862 Cyst of pancreas: Secondary | ICD-10-CM | POA: Insufficient documentation

## 2022-01-23 MED ORDER — GADOBUTROL 1 MMOL/ML IV SOLN
5.0000 mL | Freq: Once | INTRAVENOUS | Status: AC | PRN
Start: 2022-01-23 — End: 2022-01-23
  Administered 2022-01-23: 5 mL via INTRAVENOUS

## 2022-01-26 ENCOUNTER — Ambulatory Visit (INDEPENDENT_AMBULATORY_CARE_PROVIDER_SITE_OTHER): Payer: Medicare Other | Admitting: Cardiology

## 2022-01-26 ENCOUNTER — Encounter (HOSPITAL_BASED_OUTPATIENT_CLINIC_OR_DEPARTMENT_OTHER): Payer: Self-pay | Admitting: Cardiology

## 2022-01-26 VITALS — BP 110/62 | HR 75 | Ht 62.0 in | Wt 104.1 lb

## 2022-01-26 DIAGNOSIS — Z8249 Family history of ischemic heart disease and other diseases of the circulatory system: Secondary | ICD-10-CM | POA: Diagnosis not present

## 2022-01-26 DIAGNOSIS — Z7189 Other specified counseling: Secondary | ICD-10-CM

## 2022-01-26 DIAGNOSIS — I10 Essential (primary) hypertension: Secondary | ICD-10-CM | POA: Diagnosis not present

## 2022-01-26 NOTE — Patient Instructions (Signed)
Medication Instructions:  Your Physician recommend you continue on your current medication as directed.    *If you need a refill on your cardiac medications before your next appointment, please call your pharmacy*   Lab Work: None ordered today   Testing/Procedures: EKG looks good today!    Follow-Up: At [facility], you and your health needs are our priority.  As part of our continuing mission to provide you with exceptional heart care, we have created designated Provider Care Teams.  These Care Teams include your primary Cardiologist (physician) and Advanced Practice Providers (APPs -  Physician Assistants and Nurse Practitioners) who all work together to provide you with the care you need, when you need it.  We recommend signing up for the patient portal called "MyChart".  Sign up information is provided on this After Visit Summary.  MyChart is used to connect with patients for Virtual Visits (Telemedicine).  Patients are able to view lab/test results, encounter notes, upcoming appointments, etc.  Non-urgent messages can be sent to your provider as well.   To learn more about what you can do with MyChart, go to [email or web address].    Your next appointment:   2 year(s)  The format for your next appointment:   In Person  Provider:   Buford Dresser, MD{  Other Instructions Heart Healthy Diet Recommendations: A low-salt diet is recommended. Meats should be grilled, baked, or boiled. Avoid fried foods. Focus on lean protein sources like fish or chicken with vegetables and fruits. The American Heart Association is a Microbiologist!  American Heart Association Diet and Lifeystyle Recommendations   Exercise recommendations: The American Heart Association recommends 150 minutes of moderate intensity exercise weekly. Try 30 minutes of moderate intensity exercise 4-5 times per week. This could include walking, jogging, or swimming.   Important Information About  Sugar

## 2022-01-26 NOTE — Progress Notes (Signed)
Cardiology Office Note:    Date:  01/26/2022   ID:  Monica Grant, DOB 1956/08/14, MRN [id number]  PCP:  Martinique, Betty G, MD  Cardiologist:  Buford Dresser, MD PhD  Referring MD: Martinique, Betty G, MD   CC: follow up  History of Present Illness:    Monica Grant is a 66 y.o. female with a hx of hypertension, hyperlipidemia who is seen for follow-up. I initially met her 05/08/18 as a new consult at the request of [place], Malka So, MD for the evaluation and management of palpitations.  -Family history: brother died of MI at age 49 (smoker, poor health). Sister died of COPD (smoker, drug use). Another sister had cancer and had heart issues, had a 3V CABG (tobacco, alcohol use). All with poor diets. Two other sisters in good health.  Today:  Her dizziness is improved. She did not take the meclizine for dizziness. Her dizziness is still present rarely, especially when she gets up too quickly.  She takes her blood pressure at home, sometime up to 6 times a day. He blood pressure is stabilizing. This morning her blood pressure was 123/74. She was taking a 2.5 mg pill of amlodipine and cutting it in half. She is now still taking the 2.5 mg pill, but is no longer cutting it in half.   She has been trying to cut sugar out of her diet, which she believes may be helping her cholesterol and blood sugar.   She is very active in her garden, and is often crawling around and weeding. She wants to get back into her walking routine, as she has not been walking lately. Her goal is 150 minutes a week. She used to be able to be active during the heat of the day, but she cannot anymore.  She had a sinus infection recently, which she was believed was due to allergies.  She denies any palpitations, chest pain, shortness of breath, or peripheral edema. No headaches, syncope, orthopnea, or PND.   Past Medical History:  Diagnosis Date   Elevated CA 19-9 level    Hyperlipidemia    Hypertension    Kidney  stones    Osteoporosis    Pancreatic cyst    Vertigo     Past Surgical History:  Procedure Laterality Date   CARDIAC CATHETERIZATION     COLONOSCOPY     ROBOTIC ASSISTED BILATERAL SALPINGO OOPHERECTOMY Bilateral 08/23/2021   Procedure: XI ROBOTIC ASSISTED BILATERAL SALPINGO OOPHORECTOMY;  Surgeon: Lafonda Mosses, MD;  Location: [facility];  Service: Gynecology;  Laterality: Bilateral;   VULVAR LESION REMOVAL N/A 08/23/2021   Procedure: VAGINAL LESION REMOVAL;  Surgeon: Lafonda Mosses, MD;  Location: [facility];  Service: Gynecology;  Laterality: N/A;    Current Medications: Current Outpatient Medications on File Prior to Visit  Medication Sig   acetaminophen (TYLENOL) 500 MG tablet Take 500 mg by mouth daily as needed for moderate pain.   acyclovir ointment (ZOVIRAX) 5 % Apply 1 application topically every 3 (three) hours as needed.   alendronate (FOSAMAX) 70 MG tablet Take 1 tablet (70 mg total) by mouth once a week. Take with a full glass of water on an empty stomach.   amLODipine (NORVASC) 2.5 MG tablet TAKE ONE TABLET BY MOUTH DAILY   atorvastatin (LIPITOR) 10 MG tablet TAKE ONE TABLET BY MOUTH DAILY   Cholecalciferol (VITAMIN D3) 2000 units TABS Take 2,000 Units by mouth daily.   meclizine (ANTIVERT) 12.5 MG tablet Take 1 tablet (12.5 mg total)  by mouth 3 (three) times daily as needed for dizziness.   metoCLOPramide (REGLAN) 5 MG tablet Take 1 tablet 30 minutes before drinking bowel prep   PEG-KCl-NaCl-NaSulf-Na Asc-C (PLENVU) 140 g SOLR Take 140 g by mouth as directed. Manufacturer's coupon Universal coupon code:BIN: P2366821; GROUP: [id number]; PCN: [id number]; ID: [id number]; PAY NO MORE $50   No current facility-administered medications on file prior to visit.     Allergies:   Aspirin, Codeine, and Other   Social History   Tobacco Use   Smoking status: Former    Types: Cigarettes    Quit date: 04/04/1987    Years since quitting: 34.8   Smokeless tobacco: Never  Vaping Use    Vaping Use: Never used  Substance Use Topics   Alcohol use: Not Currently   Drug use: No    Family History: The patient's family history includes COPD in her sister; Cancer in her sister; Heart disease in her paternal grandfather, sister, and sister; Heart disease (age of onset: 87) in her brother; Lymphoma in her mother; Mental illness in her sister; Ovarian cancer in her mother; Pancreatic cancer in her brother; Pancreatic cancer (age of onset: 51) in her father. There is no history of Stomach cancer or Throat cancer. Mother living, age 69, has ovarian cancer and lymphoma, has HTN. Father passed age 62 from pancreatic cancer. Paternal Gma and Gpa had Mis, passed in their 57s and 6s, respectively. Brother had MI at age 4 and passed away.  ROS:   Please see the history of present illness.    (+)Dizziness  Additional pertinent ROS otherwise unremarkable.  EKGs/Labs/Other Studies Reviewed:    The following studies were reviewed today:  Monitor 06/27/18: 30 day event monitor resulted (BioTel monitor). There were approximately 140 patient triggered events recorded during this time. Patient symptoms included skipped beats, heart racing, shortness of breath, and chest pain. All episodes were sinus rhythm. Events were predominantly sinus rhythm, with occasional sinus rhythm with single PAC, single PVC, or rare couplets. The longest run of paroxysmal SVT was 8 beats at 119 bpm. No nonsustained VT seen. No pauses, sustained arrhythmias, or high degree AV block documented.  Reports history of clean cardiac cath in 2017, do not have access to records. Echo 2017 normal per scanned report  EKG:  EKG is personally reviewed  01/26/22: NSR at 75 bpm, borderline IVCD (26m) 01/26/20: normal sinus rhythm, IVCD at 63 bpm  Recent Labs: 08/18/2021: ALT 17; BUN 15; Creatinine, Ser 0.65; Hemoglobin 14.6; Platelets 196; Potassium 4.0; Sodium 139  Recent Lipid Panel    Component Value Date/Time   CHOL 190  07/11/2021 0832   TRIG 57.0 07/11/2021 0832   HDL 86.60 07/11/2021 0832   CHOLHDL 2 07/11/2021 0832   VLDL 11.4 07/11/2021 0832   LDLCALC 92 07/11/2021 0832   LDLCALC 89 07/05/2020 0814    Physical Exam:    VS:  BP 110/62   Pulse 75   Ht '5\' 2"'  (1.575 m)   Wt 104 lb 1.6 oz (47.2 kg)   SpO2 99%   BMI 19.04 kg/m     Wt Readings from Last 3 Encounters:  01/26/22 104 lb 1.6 oz (47.2 kg)  01/11/22 101 lb 6.4 oz (46 kg)  01/05/22 102 lb 2 oz (46.3 kg)    GEN: Well nourished, well developed in no acute distress HEENT: Normal, moist mucous membranes NECK: No JVD CARDIAC: regular rhythm, normal S1 and S2, no rubs or gallops. No murmur. VASCULAR: Radial and DP pulses  2+ bilaterally. No carotid bruits RESPIRATORY:  Clear to auscultation without rales, wheezing or rhonchi  ABDOMEN: Soft, non-tender, non-distended MUSCULOSKELETAL:  Ambulates independently SKIN: Warm and dry, no edema NEUROLOGIC:  Alert and oriented x 3. No focal neuro deficits noted. PSYCHIATRIC:  Normal affect   ASSESSMENT:    1. Hypertension, essential, benign   2. Family history of heart disease   3. Cardiac risk counseling   4. Counseling on health promotion and disease prevention     PLAN:    Palpitations: now only rare -see prior monitor results   Hypertension: reports excellent control at home -continue home BP checks. One check/day is reasonable, does not need to be multiple times/day unless she is feeling poorly -continue amlodipine 2.5 mg daily   Primary prevention, with strong family history of heart disease: on atorvastatin 10 mg, already has an active lifestyle, reviewed recommendations for diet and risk factor control. The 10-year ASCVD risk score (Arnett DK, et al., 2019) is: 5.1%   Values used to calculate the score:     Age: 63 years     Sex: Female     Is Non-Hispanic African American: No     Diabetic: No     Tobacco smoker: No     Systolic Blood Pressure: 570 mmHg     Is BP treated:  Yes     HDL Cholesterol: 86.6 mg/dL     Total Cholesterol: 190 mg/dL  CV risk counseling and prevention: -recommend heart healthy/Mediterranean diet, with whole grains, fruits, vegetable, fish, lean meats, nuts, and olive oil. Limit salt. -recommend moderate walking, 3-5 times/week for 30-50 minutes each session. Aim for at least 150 minutes.week. Goal should be pace of 3 miles/hours, or walking 1.5 miles in 30 minutes. She exceeds these targets already. -recommend avoidance of tobacco products. Avoid excess alcohol. She is already doing both of these things.  Plan for follow up: 2 years or sooner as needed  Medication Adjustments/Labs and Tests Ordered: Current medicines are reviewed at length with the patient today.  Concerns regarding medicines are outlined above.  Orders Placed This Encounter  Procedures   EKG 12-Lead   No orders of the defined types were placed in this encounter.   Patient Instructions  Medication Instructions:  Your Physician recommend you continue on your current medication as directed.    *If you need a refill on your cardiac medications before your next appointment, please call your pharmacy*   Lab Work: None ordered today   Testing/Procedures: EKG looks good today!    Follow-Up: At [facility], you and your health needs are our priority.  As part of our continuing mission to provide you with exceptional heart care, we have created designated Provider Care Teams.  These Care Teams include your primary Cardiologist (physician) and Advanced Practice Providers (APPs -  Physician Assistants and Nurse Practitioners) who all work together to provide you with the care you need, when you need it.  We recommend signing up for the patient portal called "MyChart".  Sign up information is provided on this After Visit Summary.  MyChart is used to connect with patients for Virtual Visits (Telemedicine).  Patients are able to view lab/test results, encounter notes,  upcoming appointments, etc.  Non-urgent messages can be sent to your provider as well.   To learn more about what you can do with MyChart, go to [email or web address].    Your next appointment:   2 year(s)  The format for your next appointment:   In Person  Provider:   Buford Dresser, MD{  Other Instructions Heart Healthy Diet Recommendations: A low-salt diet is recommended. Meats should be grilled, baked, or boiled. Avoid fried foods. Focus on lean protein sources like fish or chicken with vegetables and fruits. The American Heart Association is a Microbiologist!  American Heart Association Diet and Lifeystyle Recommendations   Exercise recommendations: The American Heart Association recommends 150 minutes of moderate intensity exercise weekly. Try 30 minutes of moderate intensity exercise 4-5 times per week. This could include walking, jogging, or swimming.   Important Information About Sugar          I,Mathew Stumpf,acting as a scribe for [employer], MD.,have documented all relevant documentation on the behalf of Buford Dresser, MD,as directed by  Buford Dresser, MD while in the presence of Buford Dresser, MD.   I, Buford Dresser, MD, have reviewed all documentation for this visit. The documentation on 01/26/22 for the exam, diagnosis, procedures, and orders are all accurate and complete.   Signed, Buford Dresser, MD PhD 01/26/2022     [place]

## 2022-03-11 ENCOUNTER — Encounter: Payer: Self-pay | Admitting: Certified Registered Nurse Anesthetist

## 2022-03-14 ENCOUNTER — Encounter: Payer: Self-pay | Admitting: Gastroenterology

## 2022-03-14 ENCOUNTER — Ambulatory Visit (AMBULATORY_SURGERY_CENTER): Payer: Medicare Other | Admitting: Gastroenterology

## 2022-03-14 VITALS — BP 112/69 | HR 72 | Temp 97.5°F | Resp 15 | Ht 62.0 in | Wt 104.0 lb

## 2022-03-14 DIAGNOSIS — D122 Benign neoplasm of ascending colon: Secondary | ICD-10-CM

## 2022-03-14 DIAGNOSIS — D123 Benign neoplasm of transverse colon: Secondary | ICD-10-CM

## 2022-03-14 DIAGNOSIS — Z860100 Personal history of colon polyps, unspecified: Secondary | ICD-10-CM

## 2022-03-14 DIAGNOSIS — Z09 Encounter for follow-up examination after completed treatment for conditions other than malignant neoplasm: Secondary | ICD-10-CM

## 2022-03-14 MED ORDER — SODIUM CHLORIDE 0.9 % IV SOLN: 500.0000 mL | Freq: Once | INTRAVENOUS | Status: DC

## 2022-03-14 NOTE — Progress Notes (Signed)
History and Physical:  This patient presents for endoscopic testing for: Encounter Diagnosis  Name Primary?   Personal history of colonic polyps Yes   Surveillance colonoscopy Clinical Hx unchanged from 5/25//23 office visit Cecal TA polyp June 2017  Patient is otherwise without complaints or active issues today.   Past Medical History: Past Medical History:  Diagnosis Date   Elevated CA 19-9 level    Hyperlipidemia    Hypertension    Kidney stones    Osteoporosis    Pancreatic cyst    Vertigo      Past Surgical History: Past Surgical History:  Procedure Laterality Date   CARDIAC CATHETERIZATION     COLONOSCOPY     ROBOTIC ASSISTED BILATERAL SALPINGO OOPHERECTOMY Bilateral 08/23/2021   Procedure: XI ROBOTIC ASSISTED BILATERAL SALPINGO OOPHORECTOMY;  Surgeon: Lafonda Mosses, MD;  Location: [facility];  Service: Gynecology;  Laterality: Bilateral;   VULVAR LESION REMOVAL N/A 08/23/2021   Procedure: VAGINAL LESION REMOVAL;  Surgeon: Lafonda Mosses, MD;  Location: [facility];  Service: Gynecology;  Laterality: N/A;    Allergies: Allergies  Allergen Reactions   Aspirin Other (See Comments)    GI upset   Codeine Other (See Comments)    GI upset   Other     Rash to ChloraPrep    Outpatient Meds: Current Outpatient Medications  Medication Sig Dispense Refill   alendronate (FOSAMAX) 70 MG tablet Take 1 tablet (70 mg total) by mouth once a week. Take with a full glass of water on an empty stomach. 12 tablet 4   amLODipine (NORVASC) 2.5 MG tablet TAKE ONE TABLET BY MOUTH DAILY 90 tablet 3   atorvastatin (LIPITOR) 10 MG tablet TAKE ONE TABLET BY MOUTH DAILY 90 tablet 3   Cholecalciferol (VITAMIN D3) 2000 units TABS Take 2,000 Units by mouth daily.     metoCLOPramide (REGLAN) 5 MG tablet Take 1 tablet 30 minutes before drinking bowel prep 2 tablet 0   acetaminophen (TYLENOL) 500 MG tablet Take 500 mg by mouth daily as needed for moderate pain.     acyclovir ointment  (ZOVIRAX) 5 % Apply 1 application topically every 3 (three) hours as needed. 30 g 1   meclizine (ANTIVERT) 12.5 MG tablet Take 1 tablet (12.5 mg total) by mouth 3 (three) times daily as needed for dizziness. 30 tablet 0   Current Facility-Administered Medications  Medication Dose Route Frequency Provider Last Rate Last Admin   0.9 %  sodium chloride infusion  500 mL Intravenous Once Danis, Estill Cotta III, MD          ___________________________________________________________________ Objective   Exam:  BP (!) 136/96   Pulse 94   Temp (!) 97.5 F (36.4 C) (Temporal)   Ht '5\' 2"'$  (1.575 m)   Wt 104 lb (47.2 kg)   SpO2 99%   BMI 19.02 kg/m   CV: RRR without murmur, S1/S2 Resp: clear to auscultation bilaterally, normal RR and effort noted GI: soft, no tenderness, with active bowel sounds.   Assessment: Encounter Diagnosis  Name Primary?   Personal history of colonic polyps Yes     Plan: Colonoscopy  The benefits and risks of the planned procedure were described in detail with the patient or (when appropriate) their health care proxy.  Risks were outlined as including, but not limited to, bleeding, infection, perforation, adverse medication reaction leading to cardiac or pulmonary decompensation, pancreatitis (if ERCP).  The limitation of incomplete mucosal visualization was also discussed.  No guarantees or warranties were given.  The patient is appropriate for an endoscopic procedure in the ambulatory setting.   - Wilfrid Lund, MD

## 2022-03-14 NOTE — Op Note (Addendum)
[name] Procedure Date: 03/14/2022 8:48 AM MRN: [id number] Endoscopist: Mallie Mussel L. Loletha Carrow , MD Age: 66 Referring MD:  Date of Birth: February 25, 1956 Gender: Female Account #: [id number] Procedure:                Colonoscopy Indications:              Surveillance: Personal history of adenomatous                            polyps on last colonoscopy 5 years ago                           cecal TA June 2017 at outside practice Medicines:                Monitored Anesthesia Care Procedure:                Pre-Anesthesia Assessment:                           - Prior to the procedure, a History and Physical                            was performed, and patient medications and                            allergies were reviewed. The patient's tolerance of                            previous anesthesia was also reviewed. The risks                            and benefits of the procedure and the sedation                            options and risks were discussed with the patient.                            All questions were answered, and informed consent                            was obtained. Prior Anticoagulants: The patient has                            taken no previous anticoagulant or antiplatelet                            agents. ASA Grade Assessment: II - A patient with                            mild systemic disease. After reviewing the risks                            and benefits, the patient was deemed in  satisfactory condition to undergo the procedure.                           After obtaining informed consent, the colonoscope                            was passed under direct vision. Throughout the                            procedure, the patient's blood pressure, pulse, and                            oxygen saturations were monitored continuously. The                            colonoscopy was somewhat difficult  due to a                            tortuous colon. Successful completion of the                            procedure was aided by using manual pressure and                            straightening and shortening the scope to obtain                            bowel loop reduction. The patient tolerated the                            procedure well. The quality of the bowel                            preparation was excellent. The ileocecal valve,                            appendiceal orifice, and rectum were photographed.                            The bowel preparation used was Plenvu. The Olympus                            Scope [id number] was introduced through the and                            advanced to the. Scope In: 8:57:06 AM Scope Out: 9:10:34 AM Scope Withdrawal Time: 0 hours 9 minutes 27 seconds  Total Procedure Duration: 0 hours 13 minutes 28 seconds  Findings:                 The perianal and digital rectal examinations were                            normal.  Repeat examination of right colon under NBI                            performed.                           A diminutive polyp was found in the proximal                            ascending colon. The polyp was semi-sessile. The                            polyp was removed with a cold biopsy forceps.                            Resection and retrieval were complete.                           A diminutive polyp was found in the transverse                            colon. The polyp was sessile. The polyp was removed                            with a cold snare. Resection and retrieval were                            complete.                           A few small-mouthed diverticula were found in the                            left colon.                           The exam was otherwise without abnormality on                            direct and retroflexion views. Complications:            No  immediate complications. Estimated Blood Loss:     Estimated blood loss was minimal. Impression:               - One diminutive polyp in the proximal ascending                            colon, removed with a cold biopsy forceps. Resected                            and retrieved.                           - One diminutive polyp in the transverse colon,  removed with a cold snare. Resected and retrieved.                           - Diverticulosis in the left colon.                           - The examination was otherwise normal on direct                            and retroflexion views. Recommendation:           - Patient has a contact number available for                            emergencies. The signs and symptoms of potential                            delayed complications were discussed with the                            patient. Return to normal activities tomorrow.                            Written discharge instructions were provided to the                            patient.                           - Resume previous diet.                           - Continue present medications.                           - Await pathology results.                           - Repeat colonoscopy is recommended for                            surveillance. The colonoscopy date will be                            determined after pathology results from today's                            exam become available for review. Shrey Boike L. Loletha Carrow, MD 03/14/2022 9:16:41 AM This report has been signed electronically.

## 2022-03-14 NOTE — Progress Notes (Signed)
Report given to PACU, vss 

## 2022-03-14 NOTE — Patient Instructions (Signed)
Please read handouts provided. Continue present medications. Await pathology results.   YOU HAD AN ENDOSCOPIC PROCEDURE TODAY AT THE [facility]:   Refer to the procedure report that was given to you for any specific questions about what was found during the examination.  If the procedure report does not answer your questions, please call your gastroenterologist to clarify.  If you requested that your care partner not be given the details of your procedure findings, then the procedure report has been included in a sealed envelope for you to review at your convenience later.  YOU SHOULD EXPECT: Some feelings of bloating in the abdomen. Passage of more gas than usual.  Walking can help get rid of the air that was put into your GI tract during the procedure and reduce the bloating. If you had a lower endoscopy (such as a colonoscopy or flexible sigmoidoscopy) you may notice spotting of blood in your stool or on the toilet paper. If you underwent a bowel prep for your procedure, you may not have a normal bowel movement for a few days.  Please Note:  You might notice some irritation and congestion in your nose or some drainage.  This is from the oxygen used during your procedure.  There is no need for concern and it should clear up in a day or so.  SYMPTOMS TO REPORT IMMEDIATELY:  Following lower endoscopy (colonoscopy or flexible sigmoidoscopy):  Excessive amounts of blood in the stool  Significant tenderness or worsening of abdominal pains  Swelling of the abdomen that is new, acute  Fever of 100F or higher  For urgent or emergent issues, a gastroenterologist can be reached at any hour by calling [phone]. Do not use MyChart messaging for urgent concerns.    DIET:  We do recommend a small meal at first, but then you may proceed to your regular diet.  Drink plenty of fluids but you should avoid alcoholic beverages for 24 hours.  ACTIVITY:  You should plan to take it easy for  the rest of today and you should NOT DRIVE or use heavy machinery until tomorrow (because of the sedation medicines used during the test).    FOLLOW UP: Our staff will call the number listed on your records the next business day following your procedure.  We will call around 7:15- 8:00 am to check on you and address any questions or concerns that you may have regarding the information given to you following your procedure. If we do not reach you, we will leave a message.  If you develop any symptoms (ie: fever, flu-like symptoms, shortness of breath, cough etc.) before then, please call [phone].  If you test positive for Covid 19 in the 2 weeks post procedure, please call and report this information to us.    If any biopsies were taken you will be contacted by phone or by letter within the next 1-3 weeks.  Please call us at [phone] if you have not heard about the biopsies in 3 weeks.    SIGNATURES/CONFIDENTIALITY: You and/or your care partner have signed paperwork which will be entered into your electronic medical record.  These signatures attest to the fact that that the information above on your After Visit Summary has been reviewed and is understood.  Full responsibility of the confidentiality of this discharge information lies with you and/or your care-partner.  

## 2022-03-15 ENCOUNTER — Telehealth: Payer: Self-pay | Admitting: *Deleted

## 2022-03-15 NOTE — Telephone Encounter (Signed)
  Follow up Call-     03/14/2022    8:15 AM 03/14/2022    7:36 AM  Call back number  Post procedure Call Back phone  # [phone] [facility]  Permission to leave phone message  Yes     Patient questions:  Do you have a fever, pain , or abdominal swelling? No. Pain Score  0 *  Have you tolerated food without any problems? Yes.    Have you been able to return to your normal activities? Yes.    Do you have any questions about your discharge instructions: Diet   No. Medications  No. Follow up visit  No.  Do you have questions or concerns about your Care? No.  Actions: * If pain score is 4 or above: No action needed, pain <4.

## 2022-03-19 ENCOUNTER — Encounter: Payer: Self-pay | Admitting: Gastroenterology

## 2022-05-10 ENCOUNTER — Encounter (HOSPITAL_BASED_OUTPATIENT_CLINIC_OR_DEPARTMENT_OTHER): Payer: Self-pay | Admitting: Cardiology

## 2022-05-10 ENCOUNTER — Telehealth: Payer: Self-pay | Admitting: Cardiology

## 2022-05-10 ENCOUNTER — Emergency Department (HOSPITAL_BASED_OUTPATIENT_CLINIC_OR_DEPARTMENT_OTHER)
Admission: EM | Admit: 2022-05-10 | Discharge: 2022-05-10 | Disposition: A | Discharge status: Home / Self Care | Payer: Medicare Other | Attending: Emergency Medicine | Admitting: Emergency Medicine

## 2022-05-10 ENCOUNTER — Emergency Department (HOSPITAL_BASED_OUTPATIENT_CLINIC_OR_DEPARTMENT_OTHER): Payer: Medicare Other | Admitting: Radiology

## 2022-05-10 ENCOUNTER — Ambulatory Visit (INDEPENDENT_AMBULATORY_CARE_PROVIDER_SITE_OTHER): Payer: Medicare Other | Admitting: Cardiology

## 2022-05-10 ENCOUNTER — Other Ambulatory Visit: Payer: Self-pay

## 2022-05-10 ENCOUNTER — Encounter (HOSPITAL_BASED_OUTPATIENT_CLINIC_OR_DEPARTMENT_OTHER): Payer: Self-pay

## 2022-05-10 VITALS — BP 170/86 | HR 90 | Ht 62.0 in | Wt 103.0 lb

## 2022-05-10 DIAGNOSIS — R079 Chest pain, unspecified: Secondary | ICD-10-CM | POA: Diagnosis present

## 2022-05-10 DIAGNOSIS — E785 Hyperlipidemia, unspecified: Secondary | ICD-10-CM | POA: Diagnosis not present

## 2022-05-10 DIAGNOSIS — R0789 Other chest pain: Secondary | ICD-10-CM | POA: Diagnosis not present

## 2022-05-10 DIAGNOSIS — R072 Precordial pain: Secondary | ICD-10-CM

## 2022-05-10 DIAGNOSIS — Z8249 Family history of ischemic heart disease and other diseases of the circulatory system: Secondary | ICD-10-CM

## 2022-05-10 DIAGNOSIS — I10 Essential (primary) hypertension: Secondary | ICD-10-CM | POA: Insufficient documentation

## 2022-05-10 DIAGNOSIS — Z79899 Other long term (current) drug therapy: Secondary | ICD-10-CM | POA: Diagnosis not present

## 2022-05-10 LAB — BASIC METABOLIC PANEL
Anion gap: 15 (ref 5–15)
BUN: 9 mg/dL (ref 8–23)
CO2: 23 mmol/L (ref 22–32)
Calcium: 10.2 mg/dL (ref 8.9–10.3)
Chloride: 102 mmol/L (ref 98–111)
Creatinine, Ser: 0.6 mg/dL (ref 0.44–1.00)
GFR, Estimated: >60 mL/min (ref >60)
Glucose, Bld: 104 mg/dL — ABNORMAL HIGH (ref 70–99)
Potassium: 3.6 mmol/L (ref 3.5–5.1)
Sodium: 140 mmol/L (ref 135–145)

## 2022-05-10 LAB — TROPONIN I (HIGH SENSITIVITY)
Troponin I (High Sensitivity): <2 ng/L (ref <18)
Troponin I (High Sensitivity): 3 ng/L (ref <18)

## 2022-05-10 LAB — CBC
HCT: 46.4 % — ABNORMAL HIGH (ref 36.0–46.0)
Hemoglobin: 15.5 g/dL — ABNORMAL HIGH (ref 12.0–15.0)
MCH: 31 pg (ref 26.0–34.0)
MCHC: 33.4 g/dL (ref 30.0–36.0)
MCV: 92.8 fL (ref 80.0–100.0)
Platelets: 219 10*3/uL (ref 150–400)
RBC: 5 MIL/uL (ref 3.87–5.11)
RDW: 13 % (ref 11.5–15.5)
WBC: 6.7 10*3/uL (ref 4.0–10.5)
nRBC: 0 % (ref 0.0–0.2)

## 2022-05-10 NOTE — ED Provider Notes (Signed)
[facility] DEPT Provider Note   CSN: [id number] Arrival date & time: 05/10/22  1715     History  Chief Complaint  Patient presents with   Chest Pain    Monica Grant is a 66 y.o. female.  The history is provided by the patient and medical records. No language interpreter was used.  Chest Pain    Patient is a 66 year old female and was sent to the ED from her cardiologist with a chief complaint of chest pain. Patient has a PMH of hypertension and hyperlipidemia that is well controlled. Patient started having chest pain two days ago.  She states she was working in her garden yesterday when she noticed intermittent chest pain. She experiences the pain every 10-20 minutes and describes it as as a "spasm" sensation. The pain is localized to her left and central chest. Nothing seems to make it better or worse. She denies radiation. She reports the pain stopped around 3:00pm and she has not had an episode since. She denies fever, dyspnea, cough, wheezing, edema, n/v/d, dizziness.   Home Medications Prior to Admission medications   Medication Sig Start Date End Date Taking? Authorizing Provider  acetaminophen (TYLENOL) 500 MG tablet Take 500 mg by mouth daily as needed for moderate pain.    [provider]  acyclovir ointment (ZOVIRAX) 5 % Apply 1 application topically every 3 (three) hours as needed. 07/11/21   Martinique, Betty G, MD  alendronate (FOSAMAX) 70 MG tablet Take 1 tablet (70 mg total) by mouth once a week. Take with a full glass of water on an empty stomach. 10/18/21   Nunzio Cobbs, MD  amLODipine (NORVASC) 2.5 MG tablet TAKE ONE TABLET BY MOUTH DAILY 10/02/21   Martin[place], Betty G, MD  atorvastatin (LIPITOR) 10 MG tablet TAKE ONE TABLET BY MOUTH DAILY 10/02/21   Martin[place], Betty G, MD  Cholecalciferol (VITAMIN D3) 2000 units TABS Take 2,000 Units by mouth daily.    [provider]  metoCLOPramide (REGLAN) 5 MG tablet Take 1 tablet 30  minutes before drinking bowel prep 01/11/22   Doran Stabler, MD      Allergies    Aspirin, Codeine, and Other    Review of Systems   Review of Systems  Cardiovascular:  Positive for chest pain.  All other systems reviewed and are negative.   Physical Exam Updated Vital Signs BP (!) 148/79   Pulse 72   Temp 98 F (36.7 C) (Oral)   Resp 14   Ht '5\' 2"'$  (1.575 m)   Wt 46.7 kg   SpO2 99%   BMI 18.84 kg/m  Physical Exam Vitals and nursing note reviewed.  Constitutional:      General: She is not in acute distress.    Appearance: She is well-developed.  HENT:     Head: Atraumatic.  Eyes:     Conjunctiva/sclera: Conjunctivae normal.  Cardiovascular:     Rate and Rhythm: Normal rate and regular rhythm.     Pulses: Normal pulses.     Heart sounds: Normal heart sounds.  Pulmonary:     Effort: Pulmonary effort is normal.     Breath sounds: No wheezing, rhonchi or rales.  Chest:     Chest wall: No tenderness.  Abdominal:     Palpations: Abdomen is soft.     Tenderness: There is no abdominal tenderness.  Musculoskeletal:     Cervical back: Neck supple.     Right lower leg: No edema.  Left lower leg: No edema.  Skin:    Findings: No rash.  Neurological:     Mental Status: She is alert.  Psychiatric:        Mood and Affect: Mood normal.     ED Results / Procedures / Treatments   Labs (all labs ordered are listed, but only abnormal results are displayed) Labs Reviewed  BASIC METABOLIC PANEL - Abnormal; Notable for the following components:      Result Value   Glucose, Bld 104 (*)    All other components within normal limits  CBC - Abnormal; Notable for the following components:   Hemoglobin 15.5 (*)    HCT 46.4 (*)    All other components within normal limits  TROPONIN I (HIGH SENSITIVITY)  TROPONIN I (HIGH SENSITIVITY)    EKG EKG Interpretation  Date/Time:  Thursday May 10 2022 17:39:03 EDT Ventricular Rate:  88 PR Interval:  132 QRS  Duration: 90 QT Interval:  366 QTC Calculation: 442 R Axis:   102 Text Interpretation: Normal sinus rhythm Right atrial enlargement Rightward axis Pulmonary disease pattern Nonspecific ST abnormality Abnormal ECG When compared with ECG of 18-Aug-2021 09:41, ST now depressed in Inferior leads Rate faster Confirmed by Ezequiel Essex [phone]) on 05/10/2022 7:45:20 PM  Radiology DG Chest 2 View  Result Date: 05/10/2022 CLINICAL DATA:  Chest pain EXAM: CHEST - 2 VIEW COMPARISON:  None Available. FINDINGS: Cardiac size is within normal limits. Low position of diaphragms may be normal variation due to patient's body habitus or suggest COPD. There are no signs of pulmonary edema or focal pulmonary consolidation. There is no pleural effusion or pneumothorax. IMPRESSION: No active cardiopulmonary disease. Electronically Signed   By: Elmer Picker M.D.   On: 05/10/2022 18:17    Procedures Procedures    Medications Ordered in ED Medications - No data to display  ED Course/ Medical Decision Making/ A&P                           Medical Decision Making Amount and/or Complexity of Data Reviewed Labs: ordered. Radiology: ordered.   66 year old female with significant history of hypertension, hyperlipidemia, presenting today with complaints of chest pain.  Patient report for the past several days she has had intermittent pain in her chest.  She described pain as a sharp pulling sensation to the left side of her chest that occurred sporadically while she was working in the garden.  Has happened recurrently for the past several days but it did stop earlier today.  Occasionally she felt like she has to take a deep breath.  She denies any associate lightheadedness and dizziness no nausea vomiting diarrhea no cough no fever chills no diaphoresis.  She was seen by cardiologist earlier today for her complaint but was recommended to come to the ER for further assessment.  She did mention occasionally feeling  lightheadedness when she stands up too quickly and attributed to her blood pressure medication.  This is not new.  No report of any pleuritic chest pain.  No history of tobacco use.  She reported having a heart catheterization done approximately 6 to 7 years ago in [place] and it was normal.  No prior history of PE DVT  On exam this is a well-appearing female appears to be in no acute discomfort.  Heart and lung sounds normal.  No evidence of peripheral edema no signs of JVD, lungs are clear on auscultation.  She does not have  any reproducible chest wall tenderness.  Vital signs remarkable for blood pressure of 145/90.  No hypoxia, no fever.  EKG obtained independently interpreted by me which shows more prominent depression in the inferior leads compared to prior EKG.  Initial troponin is normal.  Labs otherwise reassuring.  Chest x-ray shows no active cardiopulmonary disease.  I have review previous note from cardiologist who recommend patient to have delta troponin and if negative patient can be discharged with close outpatient follow-up.  If she has a rise in her troponin she may need to be admitted to the hospital for NSTEMI.  9:30 PM Second troponin is flat and negative.  Patient has had chest pain-free for the past 3 hours.  On reassessment she is still doing fine without any active chest discomfort.  At this time I felt patient is stable to be discharged home and she will follow-up closely with her cardiologist for outpatient evaluation and likely a stress test.  Return precaution given.  Patient voiced understanding and agrees with plan.  This patient presents to the ED for concern of CP, this involves an extensive number of treatment options, and is a complaint that carries with it a high risk of complications and morbidity.  The differential diagnosis includes acs, PE, MSK pain, gastritis, PNA, dissection, PTX, costochondritis, shingles  Co morbidities that complicate the patient  evaluation HTN  HLD Additional history obtained:  Additional history obtained from husband External records from outside source obtained and reviewed including EMR including prior labs and imaging  Lab Tests:  I Ordered, and personally interpreted labs.  The pertinent results include:  as above  Imaging Studies ordered:  I ordered imaging studies including CXR I independently visualized and interpreted imaging which showed normal I agree with the radiologist interpretation  Cardiac Monitoring:  The patient was maintained on a cardiac monitor.  I personally viewed and interpreted the cardiac monitored which showed an underlying rhythm of: NSR  Medicines ordered and prescription drug management:  I have reviewed the patients home medicines and have made adjustments as needed  Test Considered: as above  Critical Interventions: none  Consultations Obtained:  I requested consultation with the attending Dr. Wyvonnia Dusky,  and discussed lab and imaging findings as well as pertinent plan - they recommend: outpt f/u  Problem List / ED Course: chest pain  Reevaluation:  After the interventions noted above, I reevaluated the patient and found that they have :resolved  Social Determinants of Health: inactivity  Dispostion:  After consideration of the diagnostic results and the patients response to treatment, I feel that the patent would benefit from outpt f/u.         Final Clinical Impression(s) / ED Diagnoses Final diagnoses:  Other chest pain    Rx / DC Orders ED Discharge Orders          Ordered    Ambulatory referral to Cardiology       Comments: If you have not heard from the Cardiology office within the next 72 hours please call [phone].   05/10/22 2132              Domenic Moras, PA-C 05/10/22 2133    Ezequiel Essex, MD 05/11/22 0104

## 2022-05-10 NOTE — ED Triage Notes (Signed)
Pt describes chest discomfort since Tuesday that was sharp. Pt states pain was on left breast after gardening Wednesday that comes and goes.

## 2022-05-10 NOTE — Discharge Instructions (Signed)
You have been evaluated for your chest pain.  Fortunately no concerning findings were noted on today's exam.  Please call and follow-up closely with your cardiologist for outpatient evaluation which may include cardiac stress test.  Return if you have worsening symptoms or if you have any other concerns.

## 2022-05-10 NOTE — Patient Instructions (Signed)
Medication Instructions:  PLEASE PRESENT TO THE EMERGENCY DEPARTMENT FOR EVAL *If you need a refill on your cardiac medications before your next appointment, please call your pharmacy*  Follow-Up: At [facility], you and your health needs are our priority.  As part of our continuing mission to provide you with exceptional heart care, we have created designated Provider Care Teams.  These Care Teams include your primary Cardiologist (physician) and Advanced Practice Providers (APPs -  Physician Assistants and Nurse Practitioners) who all work together to provide you with the care you need, when you need it.  We recommend signing up for the patient portal called "MyChart".  Sign up information is provided on this After Visit Summary.  MyChart is used to connect with patients for Virtual Visits (Telemedicine).  Patients are able to view lab/test results, encounter notes, upcoming appointments, etc.  Non-urgent messages can be sent to your provider as well.   To learn more about what you can do with MyChart, go to [email or web address].    Your next appointment:   FOLLOW UP PER ED RECOMMENDATIONS

## 2022-05-10 NOTE — Telephone Encounter (Signed)
Pt c/o of Chest Pain: STAT if CP now or developed within 24 hours  1. Are you having CP right now? no  2. Are you experiencing any other symptoms (ex. SOB, nausea, vomiting, sweating)? Sometimes she feels like she can't take a deep breathe.   3. How long have you been experiencing CP? Pain on left side by breast area. States she had this pain before, but now it more frequent. It started yesterday.    4. Is your CP continuous or coming and going? Comes and goes.   5. Have you taken Nitroglycerin? no ?

## 2022-05-10 NOTE — Progress Notes (Signed)
Cardiology Office Note:    Date:  05/10/2022   ID:  Monica Grant, DOB Dec 26, 1955, MRN [id number]  PCP:  Martinique, Betty G, MD  Cardiologist:  Buford Dresser, MD PhD  Referring MD: Martinique, Betty G, MD   CC: follow up  History of Present Illness:    Monica Grant is a 66 y.o. female with a hx of hypertension, hyperlipidemia who is seen for follow-up. I initially met her 05/08/18 as a new consult at the request of Martinique, Malka So, MD for the evaluation and management of palpitations.  -Family history: brother died of MI at age 85 (smoker, poor health). Sister died of COPD (smoker, drug use). Another sister had cancer and had heart issues, had a 3V CABG (tobacco, alcohol use). All with poor diets. Two other sisters in good health.  At her last appointment she reported improvement in her dizziness. Her blood pressure was well controlled at home. She was staying active and working on cutting sugar from her diet.  Today: Three days ago she had a sharp chest pain. Yesterday she worked in the garden, and did have some shortness of breath. By yesterday evening she was experiencing a "pulling" type of chest pain, localized in her central and left chest. This was occurring every 5-10 minutes. There were no associated symptoms. This continued frequently over the next 24 hours, occurring at rest. No tenderness. Nothing seems to make it better or worse. Has not had pain like this before.  Last sensation of chest pain was 2 hours prior to appointment. She denies any chest pain with deep inspiration. However, she does complain of a mild headache at this visit, with onset 2-3 hours ago. Of note, her blood pressure is elevated in clinic today at 170/86.  Sometimes she will feel dizzy, or lightheaded when standing up too quickly. She states that she has grown more accustomed to this.   Additionally she complains of some neck discomfort and chills.   She denies any palpitations, or peripheral edema. No  syncope, orthopnea, or PND.   Past Medical History:  Diagnosis Date   Elevated CA 19-9 level    Hyperlipidemia    Hypertension    Kidney stones    Osteoporosis    Pancreatic cyst    Vertigo     Past Surgical History:  Procedure Laterality Date   CARDIAC CATHETERIZATION     COLONOSCOPY     ROBOTIC ASSISTED BILATERAL SALPINGO OOPHERECTOMY Bilateral 08/23/2021   Procedure: XI ROBOTIC ASSISTED BILATERAL SALPINGO OOPHORECTOMY;  Surgeon: Lafonda Mosses, MD;  Location: [facility];  Service: Gynecology;  Laterality: Bilateral;   VULVAR LESION REMOVAL N/A 08/23/2021   Procedure: VAGINAL LESION REMOVAL;  Surgeon: Lafonda Mosses, MD;  Location: [facility];  Service: Gynecology;  Laterality: N/A;    Current Medications: Current Outpatient Medications on File Prior to Visit  Medication Sig   acetaminophen (TYLENOL) 500 MG tablet Take 500 mg by mouth daily as needed for moderate pain.   acyclovir ointment (ZOVIRAX) 5 % Apply 1 application topically every 3 (three) hours as needed.   alendronate (FOSAMAX) 70 MG tablet Take 1 tablet (70 mg total) by mouth once a week. Take with a full glass of water on an empty stomach.   amLODipine (NORVASC) 2.5 MG tablet TAKE ONE TABLET BY MOUTH DAILY   atorvastatin (LIPITOR) 10 MG tablet TAKE ONE TABLET BY MOUTH DAILY   Cholecalciferol (VITAMIN D3) 2000 units TABS Take 2,000 Units by mouth daily.   metoCLOPramide (REGLAN) 5  MG tablet Take 1 tablet 30 minutes before drinking bowel prep   No current facility-administered medications on file prior to visit.     Allergies:   Aspirin, Codeine, and Other   Social History   Tobacco Use   Smoking status: Former    Types: Cigarettes    Quit date: 04/04/1987    Years since quitting: 35.1   Smokeless tobacco: Never  Vaping Use   Vaping Use: Never used  Substance Use Topics   Alcohol use: Not Currently   Drug use: No    Family History: The patient's family history includes COPD in her sister; Cancer in  her sister; Heart disease in her paternal grandfather, sister, and sister; Heart disease (age of onset: 31) in her brother; Lymphoma in her mother; Mental illness in her sister; Ovarian cancer in her mother; Pancreatic cancer in her brother; Pancreatic cancer (age of onset: 80) in her father. There is no history of Stomach cancer or Throat cancer. Mother living, age 5, has ovarian cancer and lymphoma, has HTN. Father passed age 45 from pancreatic cancer. Paternal Gma and Gpa had Mis, passed in their 69s and 11s, respectively. Brother had MI at age 24 and passed away.  ROS:   Please see the history of present illness.   (+) Sharp chest pain (+) Pulling chest pain (+) Shortness of breath (+) Headache (+) Neck discomfort (+) Chills (+) Occasional dizziness/lightheadedness Additional pertinent ROS otherwise unremarkable.  EKGs/Labs/Other Studies Reviewed:    The following studies were reviewed today:  Monitor 06/27/18: 30 day event monitor resulted (BioTel monitor). There were approximately 140 patient triggered events recorded during this time. Patient symptoms included skipped beats, heart racing, shortness of breath, and chest pain. All episodes were sinus rhythm. Events were predominantly sinus rhythm, with occasional sinus rhythm with single PAC, single PVC, or rare couplets. The longest run of paroxysmal SVT was 8 beats at 119 bpm. No nonsustained VT seen. No pauses, sustained arrhythmias, or high degree AV block documented.  Reports history of clean cardiac cath in 2017, do not have access to records. Echo 2017 normal per scanned report  EKG:  EKG is personally reviewed 05/10/2022:  NSR at 90 bpm. Mild ST depressions inferior-lateral leads, changed from prior 01/26/22: NSR at 75 bpm, borderline IVCD (25m) 01/26/20: normal sinus rhythm, IVCD at 63 bpm  Recent Labs: 08/18/2021: ALT 17; BUN 15; Creatinine, Ser 0.65; Hemoglobin 14.6; Platelets 196; Potassium 4.0; Sodium 139   Recent Lipid  Panel    Component Value Date/Time   CHOL 190 07/11/2021 0832   TRIG 57.0 07/11/2021 0832   HDL 86.60 07/11/2021 0832   CHOLHDL 2 07/11/2021 0832   VLDL 11.4 07/11/2021 0832   LDLCALC 92 07/11/2021 0832   LDLCALC 89 07/05/2020 0814    Physical Exam:    VS:  BP (!) 170/86   Pulse 90   Ht '5\' 2"'  (1.575 m)   Wt 103 lb (46.7 kg)   BMI 18.84 kg/m     Wt Readings from Last 3 Encounters:  05/10/22 103 lb (46.7 kg)  05/10/22 103 lb (46.7 kg)  03/14/22 104 lb (47.2 kg)    GEN: Well nourished, well developed in no acute distress HEENT: Normal, moist mucous membranes NECK: No JVD CARDIAC: regular rhythm, normal S1 and S2, no rubs or gallops. No murmur. VASCULAR: Radial and DP pulses 2+ bilaterally. No carotid bruits RESPIRATORY:  Clear to auscultation without rales, wheezing or rhonchi  ABDOMEN: Soft, non-tender, non-distended MUSCULOSKELETAL:  Ambulates independently;  palpable muscle knot left neck. No tenderness to palpation over left chest/pectoral region SKIN: Warm and dry, no edema NEUROLOGIC:  Alert and oriented x 3. No focal neuro deficits noted. PSYCHIATRIC:  Normal affect   ASSESSMENT:    1. Precordial pain   2. Hypertension, essential, benign   3. Hyperlipidemia, unspecified hyperlipidemia type   4. Family history of heart disease      PLAN:    Chest pain: Has been intermittent for the last 2-3 days, most recently intermittent at rest, pulling/sore sensation without tenderness on exam.  ECG shows new mild ST depressions in inferior-lateral leads. Has cardiac risk factors of hypertension, hyperlipidemia, family history of heart disease.  Given her symptoms and ECG, she will be transported to the ER. She needs serial troponins checked. If at least two troponins negative, then this is reassuring that there is no ischemia. She can be discharged, and we can follow up as outpatient. If her troponins are elevated, I would recommend admission to [facility] for NSTEMI with plans  for possible cath tomorrow.  We discussed the plan to present to the ER, and she is amenable. Her husband will accompany her. Report called to ER team  High risk medical condition with potential life threatening consequences if not evaluated.  NOT ADDRESSED TODAY: Hypertension: well controlled at home -continue amlodipine 2.5 mg daily   Primary prevention, with strong family history of heart disease: on atorvastatin 10 mg, already has an active lifestyle, reviewed recommendations for diet and risk factor control. The 10-year ASCVD risk score (Arnett DK, et al., 2019) is: 11.5%   Values used to calculate the score:     Age: 89 years     Sex: Female     Is Non-Hispanic African American: No     Diabetic: No     Tobacco smoker: No     Systolic Blood Pressure: 536 mmHg     Is BP treated: Yes     HDL Cholesterol: 86.6 mg/dL     Total Cholesterol: 190 mg/dL  CV risk counseling and prevention: -recommend heart healthy/Mediterranean diet, with whole grains, fruits, vegetable, fish, lean meats, nuts, and olive oil. Limit salt. -recommend moderate walking, 3-5 times/week for 30-50 minutes each session. Aim for at least 150 minutes.week. Goal should be pace of 3 miles/hours, or walking 1.5 miles in 30 minutes. She exceeds these targets already. -recommend avoidance of tobacco products. Avoid excess alcohol. She is already doing both of these things.  Plan for follow up: Recommend presentation to the ER. Follow-up in 1 week.  Medication Adjustments/Labs and Tests Ordered: Current medicines are reviewed at length with the patient today.  Concerns regarding medicines are outlined above.   Orders Placed This Encounter  Procedures   EKG 12-Lead   No orders of the defined types were placed in this encounter.  Patient Instructions  Medication Instructions:  PLEASE PRESENT TO THE EMERGENCY DEPARTMENT FOR EVAL *If you need a refill on your cardiac medications before your next appointment, please  call your pharmacy*  Follow-Up: At [facility], you and your health needs are our priority.  As part of our continuing mission to provide you with exceptional heart care, we have created designated Provider Care Teams.  These Care Teams include your primary Cardiologist (physician) and Advanced Practice Providers (APPs -  Physician Assistants and Nurse Practitioners) who all work together to provide you with the care you need, when you need it.  We recommend signing up for the patient portal called "  MyChart".  Sign up information is provided on this After Visit Summary.  MyChart is used to connect with patients for Virtual Visits (Telemedicine).  Patients are able to view lab/test results, encounter notes, upcoming appointments, etc.  Non-urgent messages can be sent to your provider as well.   To learn more about what you can do with MyChart, go to [email or web address].    Your next appointment:   FOLLOW UP PER ED RECOMMENDATIONS         I,Mathew Stumpf,acting as a scribe for [employer], MD.,have documented all relevant documentation on the behalf of Buford Dresser, MD,as directed by  Buford Dresser, MD while in the presence of Buford Dresser, MD.   I, Buford Dresser, MD, have reviewed all documentation for this visit. The documentation on 05/10/22 for the exam, diagnosis, procedures, and orders are all accurate and complete.   Signed, Buford Dresser, MD PhD 05/10/2022     [place]

## 2022-05-10 NOTE — Telephone Encounter (Signed)
Spoke with patient and she started having chest pains working in yard yesterday  Has had intermittent chest pains since, not worse with movement At times feels like she not catch her breath  Scheduled patient appointment to see Dr Harrell Gave today  Advised patient if chest pains return and does not go away she needs to call 911 and go to hospital for evaluation

## 2022-07-02 ENCOUNTER — Encounter (HOSPITAL_BASED_OUTPATIENT_CLINIC_OR_DEPARTMENT_OTHER): Payer: Self-pay | Admitting: Cardiology

## 2022-07-02 ENCOUNTER — Ambulatory Visit (INDEPENDENT_AMBULATORY_CARE_PROVIDER_SITE_OTHER): Payer: Medicare Other | Admitting: Cardiology

## 2022-07-02 VITALS — BP 124/76 | HR 96 | Ht 62.0 in | Wt 104.0 lb

## 2022-07-02 DIAGNOSIS — Z87898 Personal history of other specified conditions: Secondary | ICD-10-CM | POA: Diagnosis not present

## 2022-07-02 DIAGNOSIS — Z8249 Family history of ischemic heart disease and other diseases of the circulatory system: Secondary | ICD-10-CM | POA: Diagnosis not present

## 2022-07-02 DIAGNOSIS — R079 Chest pain, unspecified: Secondary | ICD-10-CM

## 2022-07-02 DIAGNOSIS — Z7189 Other specified counseling: Secondary | ICD-10-CM | POA: Diagnosis not present

## 2022-07-02 DIAGNOSIS — E785 Hyperlipidemia, unspecified: Secondary | ICD-10-CM

## 2022-07-02 NOTE — Patient Instructions (Addendum)
Medication Instructions:  Your physician recommends that you continue on your current medications as directed. Please refer to the Current Medication list given to you today.   *If you need a refill on your cardiac medications before your next appointment, please call your pharmacy*  Lab Work: NONE  Testing/Procedures: NONE  Follow-Up: At [facility], you and your health needs are our priority.  As part of our continuing mission to provide you with exceptional heart care, we have created designated Provider Care Teams.  These Care Teams include your primary Cardiologist (physician) and Advanced Practice Providers (APPs -  Physician Assistants and Nurse Practitioners) who all work together to provide you with the care you need, when you need it.  We recommend signing up for the patient portal called "MyChart".  Sign up information is provided on this After Visit Summary.  MyChart is used to connect with patients for Virtual Visits (Telemedicine).  Patients are able to view lab/test results, encounter notes, upcoming appointments, etc.  Non-urgent messages can be sent to your provider as well.   To learn more about what you can do with MyChart, go to [email or web address].    Your next appointment:   12 month(s)  The format for your next appointment:   In Person  Provider:   Bridgette Christopher, MD        

## 2022-07-02 NOTE — Progress Notes (Signed)
Cardiology Office Note:    Date:  07/02/2022   ID:  Monica Grant, DOB 1956/04/20, MRN [id number]  PCP:  Martinique, Betty G, MD  Cardiologist:  Buford Dresser, MD PhD  Referring MD: Martinique, Betty G, MD   CC: follow up  History of Present Illness:    Monica Grant is a 66 y.o. female with a hx of hypertension, hyperlipidemia who is seen for follow-up. I initially met her 05/08/18 as a new consult at the request of Martinique, Malka So, MD for the evaluation and management of palpitations.  -Family history: brother died of MI at age 56 (smoker, poor health). Sister died of COPD (smoker, drug use). Another sister had cancer and had heart issues, had a 3V CABG (tobacco, alcohol use). All with poor diets. Two other sisters in good health.  At her last appointment she reported new frequent episodes of sharp chest pain with onset 3 days prior. This was a "pulling" chest pain, localized in her central and left chest. This continued frequently over the next 24 hours, also occurring at rest. Her ECG showed new mild ST depressions in inferior-lateral leads. Given her symptoms and ECG, she was transported to the ER for serial troponins. On presentation to the ED her vital signs were remarkable for blood pressure of 145/90.  No hypoxia, no fever.  EKG showed more prominent depression in the inferior leads compared to prior EKG. Labs otherwise reassuring. Chest x-ray showed no active cardiopulmonary disease. Initial troponin was normal. Second troponin is flat and negative. She was chest pain-free for 3 hours and was discharged with outpatient cardiology follow-up.  Today, she reports feeling fine with no recurring chest pain. At baseline she complains of intermittent chills.  After sitting and working for a prolonged time she has mild dizziness if she stands up too quickly. Most of the time she is careful to move more slowly.  Usually her physical exercise consisted of walking. She has been trying to  increase her workouts for more strength training. Also she continues to do well with monitoring her diet. She typically tries to avoid trans-fats.  Recently she received her COVID booster vaccination and will soon receive her flu vaccine. She has been tolerating her medications.  She denies any palpitations, shortness of breath, or peripheral edema. No headaches, syncope, orthopnea, or PND.   Past Medical History:  Diagnosis Date   Elevated CA 19-9 level    Hyperlipidemia    Hypertension    Kidney stones    Osteoporosis    Pancreatic cyst    Vertigo     Past Surgical History:  Procedure Laterality Date   CARDIAC CATHETERIZATION     COLONOSCOPY     ROBOTIC ASSISTED BILATERAL SALPINGO OOPHERECTOMY Bilateral 08/23/2021   Procedure: XI ROBOTIC ASSISTED BILATERAL SALPINGO OOPHORECTOMY;  Surgeon: Lafonda Mosses, MD;  Location: [facility];  Service: Gynecology;  Laterality: Bilateral;   VULVAR LESION REMOVAL N/A 08/23/2021   Procedure: VAGINAL LESION REMOVAL;  Surgeon: Lafonda Mosses, MD;  Location: [facility];  Service: Gynecology;  Laterality: N/A;    Current Medications: Current Outpatient Medications on File Prior to Visit  Medication Sig   acetaminophen (TYLENOL) 500 MG tablet Take 500 mg by mouth daily as needed for moderate pain.   acyclovir ointment (ZOVIRAX) 5 % Apply 1 application topically every 3 (three) hours as needed.   alendronate (FOSAMAX) 70 MG tablet Take 1 tablet (70 mg total) by mouth once a week. Take with a full glass of water  on an empty stomach.   amLODipine (NORVASC) 2.5 MG tablet TAKE ONE TABLET BY MOUTH DAILY   atorvastatin (LIPITOR) 10 MG tablet TAKE ONE TABLET BY MOUTH DAILY   Cholecalciferol (VITAMIN D3) 2000 units TABS Take 2,000 Units by mouth daily.   No current facility-administered medications on file prior to visit.     Allergies:   Aspirin, Codeine, and Other   Social History   Tobacco Use   Smoking status: Former    Types: Cigarettes     Quit date: 04/04/1987    Years since quitting: 35.2   Smokeless tobacco: Never  Vaping Use   Vaping Use: Never used  Substance Use Topics   Alcohol use: Not Currently   Drug use: No    Family History: The patient's family history includes COPD in her sister; Cancer in her sister; Heart disease in her paternal grandfather, sister, and sister; Heart disease (age of onset: 47) in her brother; Lymphoma in her mother; Mental illness in her sister; Ovarian cancer in her mother; Pancreatic cancer in her brother; Pancreatic cancer (age of onset: 38) in her father. There is no history of Stomach cancer or Throat cancer. Mother living, age 78, has ovarian cancer and lymphoma, has HTN. Father passed age 71 from pancreatic cancer. Paternal Gma and Gpa had Mis, passed in their 51s and 83s, respectively. Brother had MI at age 35 and passed away.  ROS:   Please see the history of present illness.   (+) Mild dizziness Additional pertinent ROS otherwise unremarkable.  EKGs/Labs/Other Studies Reviewed:    The following studies were reviewed today:  Monitor 06/27/18: 30 day event monitor resulted (BioTel monitor). There were approximately 140 patient triggered events recorded during this time. Patient symptoms included skipped beats, heart racing, shortness of breath, and chest pain. All episodes were sinus rhythm. Events were predominantly sinus rhythm, with occasional sinus rhythm with single PAC, single PVC, or rare couplets. The longest run of paroxysmal SVT was 8 beats at 119 bpm. No nonsustained VT seen. No pauses, sustained arrhythmias, or high degree AV block documented.  Reports history of clean cardiac cath in 2017, do not have access to records. Echo 2017 normal per scanned report  EKG:  EKG is personally reviewed 07/02/2022:  EKG was not ordered. 05/10/2022:  NSR at 90 bpm. Mild ST depressions inferior-lateral leads, changed from prior 01/26/22: NSR at 75 bpm, borderline IVCD (52m) 01/26/20:  normal sinus rhythm, IVCD at 63 bpm  Recent Labs: 08/18/2021: ALT 17 05/10/2022: BUN 9; Creatinine, Ser 0.60; Hemoglobin 15.5; Platelets 219; Potassium 3.6; Sodium 140   Recent Lipid Panel    Component Value Date/Time   CHOL 190 07/11/2021 0832   TRIG 57.0 07/11/2021 0832   HDL 86.60 07/11/2021 0832   CHOLHDL 2 07/11/2021 0832   VLDL 11.4 07/11/2021 0832   LDLCALC 92 07/11/2021 0832   LDLCALC 89 07/05/2020 0814    Physical Exam:    VS:  BP 124/76   Pulse 96   Ht _0  (1.575 m)   Wt 104 lb (47.2 kg)   BMI 19.02 kg/m     Wt Readings from Last 3 Encounters:  07/02/22 104 lb (47.2 kg)  05/10/22 103 lb (46.7 kg)  05/10/22 103 lb (46.7 kg)    GEN: Well nourished, well developed in no acute distress HEENT: Normal, moist mucous membranes NECK: No JVD CARDIAC: regular rhythm, normal S1 and S2, no rubs or gallops. No murmur. VASCULAR: Radial and DP pulses 2+ bilaterally. No carotid  bruits RESPIRATORY:  Clear to auscultation without rales, wheezing or rhonchi  ABDOMEN: Soft, non-tender, non-distended MUSCULOSKELETAL:  Ambulates independently SKIN: Warm and dry, no edema NEUROLOGIC:  Alert and oriented x 3. No focal neuro deficits noted. PSYCHIATRIC:  Normal affect    ASSESSMENT:    1. History of chest pain   2. Hyperlipidemia, unspecified hyperlipidemia type   3. Family history of heart disease   4. Cardiac risk counseling     PLAN:    Chest pain: Has not recurred. ER evaluation unremarkable. Does have family history. Able to exercise without symptoms/limitations. Discussed that if chest pain recurs, I would recommend pursuing additional testing. Reviewed noncardiac causes of chest pain as well.  Hypertension: well controlled at home -continue amlodipine 2.5 mg daily   Primary prevention, with strong family history of heart disease: on atorvastatin 10 mg, already has an active lifestyle, reviewed recommendations for diet and risk factor control. The 10-year ASCVD  risk score (Arnett DK, et al., 2019) is: 6.4%   Values used to calculate the score:     Age: 10 years     Sex: Female     Is Non-Hispanic African American: No     Diabetic: No     Tobacco smoker: No     Systolic Blood Pressure: 976 mmHg     Is BP treated: Yes     HDL Cholesterol: 86.6 mg/dL     Total Cholesterol: 190 mg/dL  CV risk counseling and prevention: -recommend heart healthy/Mediterranean diet, with whole grains, fruits, vegetable, fish, lean meats, nuts, and olive oil. Limit salt. -recommend moderate walking, 3-5 times/week for 30-50 minutes each session. Aim for at least 150 minutes.week. Goal should be pace of 3 miles/hours, or walking 1.5 miles in 30 minutes. She exceeds these targets already. -recommend avoidance of tobacco products. Avoid excess alcohol. She is already doing both of these things.  Plan for follow up: 1 year or sooner as needed.  Medication Adjustments/Labs and Tests Ordered: Current medicines are reviewed at length with the patient today.  Concerns regarding medicines are outlined above.   No orders of the defined types were placed in this encounter.  No orders of the defined types were placed in this encounter.  Patient Instructions  Medication Instructions:  Your physician recommends that you continue on your current medications as directed. Please refer to the Current Medication list given to you today.  *If you need a refill on your cardiac medications before your next appointment, please call your pharmacy*  Lab Work: NONE  Testing/Procedures: NONE  Follow-Up: At [facility], you and your health needs are our priority.  As part of our continuing mission to provide you with exceptional heart care, we have created designated Provider Care Teams.  These Care Teams include your primary Cardiologist (physician) and Advanced Practice Providers (APPs -  Physician Assistants and Nurse Practitioners) who all work together to provide you with  the care you need, when you need it.  We recommend signing up for the patient portal called "MyChart".  Sign up information is provided on this After Visit Summary.  MyChart is used to connect with patients for Virtual Visits (Telemedicine).  Patients are able to view lab/test results, encounter notes, upcoming appointments, etc.  Non-urgent messages can be sent to your provider as well.   To learn more about what you can do with MyChart, go to [email or web address].    Your next appointment:   12 month(s)  The format for your next appointment:  In Person  Provider:   Buford Dresser, MD             [facility] Stumpf,acting as a scribe for Buford Dresser, MD.,have documented all relevant documentation on the behalf of Buford Dresser, MD,as directed by  Buford Dresser, MD while in the presence of Buford Dresser, MD.   I, Buford Dresser, MD, have reviewed all documentation for this visit. The documentation on 07/02/22 for the exam, diagnosis, procedures, and orders are all accurate and complete.   Signed, Buford Dresser, MD PhD 07/02/2022     [place]

## 2022-08-06 ENCOUNTER — Telehealth: Payer: Self-pay | Admitting: *Deleted

## 2022-08-06 NOTE — Telephone Encounter (Signed)
Patient called has dexa done at [facility] report in chart in care everywhere. Patient would like to know the results. Please advise

## 2022-08-06 NOTE — Telephone Encounter (Signed)
BMD results reviewed.  She has osteoporosis of the right hip and osteopenia of the spine.  Both are improved.   I recommend continued Fosamax 70 mg weekly.  She can have refills until her annual exam is due in March, 2024.  Please schedule her office visit for March, 2024 for further refills.

## 2022-08-06 NOTE — Progress Notes (Unsigned)
HPI: Monica Grant is a 66 y.o. female, who is here today for her routine physical.  Last CPE: 07/11/2021  Regular exercise 3 or more time per week: *** Following a healthy diet: ***  Chronic medical problems: ***  Immunization History  Administered Date(s) Administered  . COVID-19, mRNA, vaccine(Comirnaty)12 years and older 05/29/2022  . Influenza, High Dose Seasonal PF 07/07/2022  . Influenza,inj,Quad PF,6+ Mos 07/05/2020  . Influenza-Unspecified 07/07/2021  . Moderna Covid-19 Vaccine Bivalent Booster 48yr & up 06/06/2021  . PFIZER(Purple Top)SARS-COV-2 Vaccination 11/06/2019, 12/01/2019, 06/10/2020, 12/22/2020  . Tdap 01/14/2018  . Zoster Recombinat (Shingrix) 09/26/2017, 01/30/2018   Health Maintenance  Topic Date Due  . Pneumonia Vaccine 66 Years old (1 - PCV) Never done  . Medicare Annual Wellness (AWV)  11/15/2022  . MAMMOGRAM  10/27/2023  . DTaP/Tdap/Td (2 - Td or Tdap) 01/15/2028  . COLONOSCOPY (Pts 45-459yrInsurance coverage will need to be confirmed)  03/14/2029  . INFLUENZA VACCINE  Completed  . DEXA SCAN  Completed  . COVID-19 Vaccine  Completed  . Hepatitis C Screening  Completed  . Zoster Vaccines- Shingrix  Completed  . HPV VACCINES  Aged Out    She has *** concerns today.  Review of Systems  Current Outpatient Medications on File Prior to Visit  Medication Sig Dispense Refill  . acetaminophen (TYLENOL) 500 MG tablet Take 500 mg by mouth daily as needed for moderate pain.    . Marland Kitchencyclovir ointment (ZOVIRAX) 5 % Apply 1 application topically every 3 (three) hours as needed. 30 g 1  . alendronate (FOSAMAX) 70 MG tablet Take 1 tablet (70 mg total) by mouth once a week. Take with a full glass of water on an empty stomach. 12 tablet 4  . amLODipine (NORVASC) 2.5 MG tablet TAKE ONE TABLET BY MOUTH DAILY 90 tablet 3  . atorvastatin (LIPITOR) 10 MG tablet TAKE ONE TABLET BY MOUTH DAILY 90 tablet 3  . Cholecalciferol (VITAMIN D3) 2000 units TABS Take  2,000 Units by mouth daily.     No current facility-administered medications on file prior to visit.    Past Medical History:  Diagnosis Date  . Elevated CA 19-9 level   . Hyperlipidemia   . Hypertension   . Kidney stones   . Osteoporosis   . Pancreatic cyst   . Vertigo     Past Surgical History:  Procedure Laterality Date  . CARDIAC CATHETERIZATION    . COLONOSCOPY    . ROBOTIC ASSISTED BILATERAL SALPINGO OOPHERECTOMY Bilateral 08/23/2021   Procedure: XI ROBOTIC ASSISTED BILATERAL SALPINGO OOPHORECTOMY;  Surgeon: TuLafonda MossesMD;  Location: [facility];  Service: Gynecology;  Laterality: Bilateral;  . VULVAR LESION REMOVAL N/A 08/23/2021   Procedure: VAGINAL LESION REMOVAL;  Surgeon: TuLafonda MossesMD;  Location: [facility];  Service: Gynecology;  Laterality: N/A;    Allergies  Allergen Reactions  . Aspirin Other (See Comments)    GI upset  . Codeine Other (See Comments)    GI upset  . Other     Rash to ChloraPrep    Family History  Problem Relation Age of Onset  . Ovarian cancer Mother   . Lymphoma Mother   . Pancreatic cancer Father [id number]     pancreatic  . Cancer Sister        ? type  . COPD Sister   . Heart disease Sister   . Mental illness Sister   . Heart disease Sister   . Pancreatic cancer Brother   .  Heart disease Brother 61       MI  . Heart disease Paternal Grandfather   . Stomach cancer Neg Hx   . Throat cancer Neg Hx     Social History   Socioeconomic History  . Marital status: Married    Spouse name: Not on file  . Number of children: 2  . Years of education: Not on file  . Highest education level: Not on file  Occupational History  . Not on file  Tobacco Use  . Smoking status: Former    Types: Cigarettes    Quit date: 04/04/1987    Years since quitting: 35.3  . Smokeless tobacco: Never  Vaping Use  . Vaping Use: Never used  Substance and Sexual Activity  . Alcohol use: Not Currently  . Drug use: No  . Sexual activity: Not  Currently    Partners: Male    Comment: 1st intercourse 89 yo-1 partner  Other Topics Concern  . Not on file  Social History Narrative  . Not on file   Social Determinants of Health   Financial Resource Strain: Low Risk  (11/14/2021)   Overall Financial Resource Strain (CARDIA)   . Difficulty of Paying Living Expenses: Not hard at all  Food Insecurity: No Food Insecurity (11/14/2021)   Hunger Vital Sign   . Worried About Charity fundraiser in the Last Year: Never true   . Ran Out of Food in the Last Year: Never true  Transportation Needs: No Transportation Needs (11/14/2021)   PRAPARE - Transportation   . Lack of Transportation (Medical): No   . Lack of Transportation (Non-Medical): No  Physical Activity: Insufficiently Active (11/14/2021)   Exercise Vital Sign   . Days of Exercise per Week: 3 days   . Minutes of Exercise per Session: 30 Grant  Stress: No Stress Concern Present (11/14/2021)   [place]   . Feeling of Stress : Not at all  Social Connections: Not on file    There were no vitals filed for this visit. There is no height or weight on file to calculate BMI.  Wt Readings from Last 3 Encounters:  07/02/22 104 lb (47.2 kg)  05/10/22 103 lb (46.7 kg)  05/10/22 103 lb (46.7 kg)    Physical Exam Vitals and nursing note reviewed.  Constitutional:      General: She is not in acute distress.    Appearance: She is well-developed.  HENT:     Head: Normocephalic and atraumatic.     Right Ear: Hearing, tympanic membrane, ear canal and external ear normal.     Left Ear: Hearing, tympanic membrane, ear canal and external ear normal.     Mouth/Throat:     Mouth: Mucous membranes are moist.     Pharynx: Oropharynx is clear. Uvula midline.  Eyes:     Extraocular Movements: Extraocular movements intact.     Conjunctiva/sclera: Conjunctivae normal.     Pupils: Pupils are equal, round, and reactive to light.   Neck:     Thyroid: No thyromegaly.     Trachea: No tracheal deviation.  Cardiovascular:     Rate and Rhythm: Normal rate and regular rhythm.     Pulses:          Dorsalis pedis pulses are 2+ on the right side and 2+ on the left side.       Posterior tibial pulses are 2+ on the right side and 2+ on the left  side.     Heart sounds: No murmur heard. Pulmonary:     Effort: Pulmonary effort is normal. No respiratory distress.     Breath sounds: Normal breath sounds.  Abdominal:     Palpations: Abdomen is soft. There is no hepatomegaly or mass.     Tenderness: There is no abdominal tenderness.  Genitourinary:    Comments: Deferred to gyn. Musculoskeletal:     Comments: No major deformity or signs of synovitis appreciated.  Lymphadenopathy:     Cervical: No cervical adenopathy.     Upper Body:     Right upper body: No supraclavicular adenopathy.     Left upper body: No supraclavicular adenopathy.  Skin:    General: Skin is warm.     Findings: No erythema or rash.  Neurological:     General: No focal deficit present.     Mental Status: She is alert and oriented to person, place, and time.     Cranial Nerves: No cranial nerve deficit.     Coordination: Coordination normal.     Gait: Gait normal.     Deep Tendon Reflexes:     Reflex Scores:      Bicep reflexes are 2+ on the right side and 2+ on the left side.      Patellar reflexes are 2+ on the right side and 2+ on the left side. Psychiatric:     Comments: Well groomed, good eye contact.   ASSESSMENT AND PLAN: Monica Grant was here today annual physical examination.  No orders of the defined types were placed in this encounter.   There are no diagnoses linked to this encounter.  There are no diagnoses linked to this encounter.  No follow-ups on file.  Damarious Holtsclaw G. Martinique, MD  [facility]. [place] office.

## 2022-08-07 ENCOUNTER — Encounter: Payer: Self-pay | Admitting: Family Medicine

## 2022-08-07 ENCOUNTER — Ambulatory Visit (INDEPENDENT_AMBULATORY_CARE_PROVIDER_SITE_OTHER): Payer: Medicare Other | Admitting: Family Medicine

## 2022-08-07 VITALS — BP 130/80 | HR 80 | Temp 98.0°F | Resp 16 | Ht 62.0 in | Wt 104.0 lb

## 2022-08-07 DIAGNOSIS — Z0189 Encounter for other specified special examinations: Secondary | ICD-10-CM

## 2022-08-07 DIAGNOSIS — Z0000 Encounter for general adult medical examination without abnormal findings: Secondary | ICD-10-CM

## 2022-08-07 DIAGNOSIS — E785 Hyperlipidemia, unspecified: Secondary | ICD-10-CM

## 2022-08-07 DIAGNOSIS — R978 Other abnormal tumor markers: Secondary | ICD-10-CM

## 2022-08-07 DIAGNOSIS — I10 Essential (primary) hypertension: Secondary | ICD-10-CM

## 2022-08-07 DIAGNOSIS — Z23 Encounter for immunization: Secondary | ICD-10-CM

## 2022-08-07 DIAGNOSIS — K862 Cyst of pancreas: Secondary | ICD-10-CM

## 2022-08-07 LAB — COMPREHENSIVE METABOLIC PANEL
ALT: 12 U/L (ref 0–35)
AST: 20 U/L (ref 0–37)
Albumin: 4.8 g/dL (ref 3.5–5.2)
Alkaline Phosphatase: 63 U/L (ref 39–117)
BUN: 9 mg/dL (ref 6–23)
CO2: 30 mEq/L (ref 19–32)
Calcium: 10.1 mg/dL (ref 8.4–10.5)
Chloride: 103 mEq/L (ref 96–112)
Creatinine, Ser: 0.65 mg/dL (ref 0.40–1.20)
GFR: 91.52 mL/min (ref >60.00)
Glucose, Bld: 88 mg/dL (ref 70–99)
Potassium: 4.2 mEq/L (ref 3.5–5.1)
Sodium: 141 mEq/L (ref 135–145)
Total Bilirubin: 0.6 mg/dL (ref 0.2–1.2)
Total Protein: 7.7 g/dL (ref 6.0–8.3)

## 2022-08-07 LAB — LIPID PANEL
Cholesterol: 186 mg/dL (ref 0–200)
HDL: 88.3 mg/dL (ref >39.00)
LDL Cholesterol: 85 mg/dL (ref 0–99)
NonHDL: 97.31
Total CHOL/HDL Ratio: 2
Triglycerides: 61 mg/dL (ref 0.0–149.0)
VLDL: 12.2 mg/dL (ref 0.0–40.0)

## 2022-08-07 LAB — TSH: TSH: 0.86 u[IU]/mL (ref 0.35–5.50)

## 2022-08-07 MED ORDER — AMLODIPINE BESYLATE 2.5 MG PO TABS: 2.5000 mg | ORAL_TABLET | Freq: Every day | ORAL | 3 refills | Status: DC

## 2022-08-07 NOTE — Assessment & Plan Note (Signed)
Continue atorvastatin 10 mg daily and low-fat diet. Further recommendation will be given according to lipid panel result.

## 2022-08-07 NOTE — Assessment & Plan Note (Signed)
Problem has been stable. Follows with gastroenterologist, pancreatic MRI every 2 years.

## 2022-08-07 NOTE — Patient Instructions (Addendum)
A few things to remember from today's visit:  Routine general medical examination at a health care facility  Hyperlipidemia, unspecified hyperlipidemia type - Plan: Comprehensive metabolic panel  Hypertension, essential, benign - Plan: Lipid panel, amLODipine (NORVASC) 2.5 MG tablet  Pancreatic cyst - Plan: Cancer Antigen 19-9  Patient request for diagnostic testing - Plan: Cancer Antigen 19-9  Elevated CA 19-9 level - Plan: Cancer Antigen 19-9 Preventive Care 65 Years and Older, Female Preventive care refers to lifestyle choices and visits with your health care provider that can promote health and wellness. Preventive care visits are also called wellness exams. What can I expect for my preventive care visit? Counseling Your health care provider may ask you questions about your: Medical history, including: Past medical problems. Family medical history. Pregnancy and menstrual history. History of falls. Current health, including: Memory and ability to understand (cognition). Emotional well-being. Home life and relationship well-being. Sexual activity and sexual health. Lifestyle, including: Alcohol, nicotine or tobacco, and drug use. Access to firearms. Diet, exercise, and sleep habits. Work and work Statistician. Sunscreen use. Safety issues such as seatbelt and bike helmet use. Physical exam Your health care provider will check your: Height and weight. These may be used to calculate your BMI (body mass index). BMI is a measurement that tells if you are at a healthy weight. Waist circumference. This measures the distance around your waistline. This measurement also tells if you are at a healthy weight and may help predict your risk of certain diseases, such as type 2 diabetes and high blood pressure. Heart rate and blood pressure. Body temperature. Skin for abnormal spots. What immunizations do I need?  Vaccines are usually given at various ages, according to a schedule.  Your health care provider will recommend vaccines for you based on your age, medical history, and lifestyle or other factors, such as travel or where you work. What tests do I need? Screening Your health care provider may recommend screening tests for certain conditions. This may include: Lipid and cholesterol levels. Hepatitis C test. Hepatitis B test. HIV (human immunodeficiency virus) test. STI (sexually transmitted infection) testing, if you are at risk. Lung cancer screening. Colorectal cancer screening. Diabetes screening. This is done by checking your blood sugar (glucose) after you have not eaten for a while (fasting). Mammogram. Talk with your health care provider about how often you should have regular mammograms. BRCA-related cancer screening. This may be done if you have a family history of breast, ovarian, tubal, or peritoneal cancers. Bone density scan. This is done to screen for osteoporosis. Talk with your health care provider about your test results, treatment options, and if necessary, the need for more tests. Follow these instructions at home: Eating and drinking  Eat a diet that includes fresh fruits and vegetables, whole grains, lean protein, and low-fat dairy products. Limit your intake of foods with high amounts of sugar, saturated fats, and salt. Take vitamin and mineral supplements as recommended by your health care provider. Do not drink alcohol if your health care provider tells you not to drink. If you drink alcohol: Limit how much you have to 0-1 drink a day. Know how much alcohol is in your drink. In the [place] one drink equals one 12 oz bottle of beer (355 mL), one 5 oz glass of wine (148 mL), or one 1 oz glass of hard liquor (44 mL). Lifestyle Brush your teeth every morning and night with fluoride toothpaste. Floss one time each day. Exercise for at least 30  minutes 5 or more days each week. Do not use any products that contain nicotine or tobacco. These  products include cigarettes, chewing tobacco, and vaping devices, such as e-cigarettes. If you need help quitting, ask your health care provider. Do not use drugs. If you are sexually active, practice safe sex. Use a condom or other form of protection in order to prevent STIs. Take aspirin only as told by your health care provider. Make sure that you understand how much to take and what form to take. Work with your health care provider to find out whether it is safe and beneficial for you to take aspirin daily. Ask your health care provider if you need to take a cholesterol-lowering medicine (statin). Find healthy ways to manage stress, such as: Meditation, yoga, or listening to music. Journaling. Talking to a trusted person. Spending time with friends and family. Minimize exposure to UV radiation to reduce your risk of skin cancer. Safety Always wear your seat belt while driving or riding in a vehicle. Do not drive: If you have been drinking alcohol. Do not ride with someone who has been drinking. When you are tired or distracted. While texting. If you have been using any mind-altering substances or drugs. Wear a helmet and other protective equipment during sports activities. If you have firearms in your house, make sure you follow all gun safety procedures. What's next? Visit your health care provider once a year for an annual wellness visit. Ask your health care provider how often you should have your eyes and teeth checked. Stay up to date on all vaccines. This information is not intended to replace advice given to you by your health care provider. Make sure you discuss any questions you have with your health care provider. Document Revised: 02/01/2021 Document Reviewed: 02/01/2021 Elsevier Patient Education  [place].  If you need refills for medications you take chronically, please call your pharmacy. Do not use My Chart to request refills or for acute issues that need  immediate attention. If you send a my chart message, it may take a few days to be addressed, specially if I am not in the office.  Please be sure medication list is accurate. If a new problem present, please set up appointment sooner than planned today.

## 2022-08-07 NOTE — Telephone Encounter (Signed)
Patient informed, she has refills on Rx already. Patient is on her way to her PCP appointment she will call back to schedule.

## 2022-08-07 NOTE — Assessment & Plan Note (Signed)
We discussed the importance of regular physical activity and healthy diet for prevention of chronic illness and/or complications. Preventive guidelines reviewed. Vaccination updated. Following with gynecology for her female preventive care. Ca++ through her diet and vit D supplementation to continue Next CPE in a year.

## 2022-08-07 NOTE — Assessment & Plan Note (Addendum)
BP adequately controlled here today and at home. Continue amlodipine 2.5 mg daily and low-salt diet. Continue monitoring BP at home regularly. Her eye exam is current.

## 2022-08-07 NOTE — Assessment & Plan Note (Signed)
It has been normal for the past 2 years. She would like to continue having CA-9 level check annually.

## 2022-08-08 LAB — CANCER ANTIGEN 19-9: CA 19-9: 37 U/mL — ABNORMAL HIGH (ref <34)

## 2022-08-26 NOTE — Telephone Encounter (Signed)
Please contact patient regarding her bone density from [facility] which was faxed to me.  She has osteoporosis of the right hip, which is improved from 2021.  She has osteopenia of the spine, which is improved from 2021.   I recommend she continue her Fosamax.   Have her keep her office visit for

## 2022-08-26 NOTE — Telephone Encounter (Signed)
Please have her keep her office visit with me for March.

## 2022-09-26 ENCOUNTER — Other Ambulatory Visit: Payer: Self-pay | Admitting: Family Medicine

## 2022-09-26 DIAGNOSIS — I10 Essential (primary) hypertension: Secondary | ICD-10-CM

## 2022-09-26 DIAGNOSIS — E785 Hyperlipidemia, unspecified: Secondary | ICD-10-CM

## 2022-10-09 NOTE — Progress Notes (Signed)
67 y.o. G2P2 Married Caucasian female here for breast and pelvic exam and pap and office visit.   Pt wants to discuss painful intercourse. Pt is seeing PCP for UTI beginning today.  She is followed for osteoporosis and treated with weekly Fosamax for about 2 years.  She takes vit D 2000 IU daily.  Has diet rich in calcium.   Patient has an MRI of her pancreas yearly through her GI.  She also does CA19 marker testing.   PCP:   Betty Martinique, MD  No LMP recorded. Patient is postmenopausal.           Sexually active: No.  The current method of family planning is post menopausal status.    Exercising: Yes.     walking Smoker:  former  Health Maintenance: Pap:  10/15/19-WNL, 09/24/19-unsatisfactory  History of abnormal Pap:  no MMG:   10/24/21 - BI-RADS1, [facility]. Colonoscopy:  2023 - [facility] -  polyps - due in 7 years per patient.  BMD:   07/24/22 - [facility]:  right hip -2.9 (-3.1), spine -1.9 (was -2.4) 06/2020  Result  osteoporosis of hip, osteopenia of the spine  TDaP:  01/14/18 Gardasil:   no HIV: 01/14/18 Hep C: unsure Screening Labs: PCP   reports that she quit smoking about 35 years ago. Her smoking use included cigarettes. She has never used smokeless tobacco. She reports that she does not currently use alcohol. She reports that she does not use drugs.  Past Medical History:  Diagnosis Date   Elevated CA 19-9 level    Hyperlipidemia    Hypertension    Kidney stones    Osteoporosis    Pancreatic cyst    Vertigo     Past Surgical History:  Procedure Laterality Date   CARDIAC CATHETERIZATION     COLONOSCOPY     ROBOTIC ASSISTED BILATERAL SALPINGO OOPHERECTOMY Bilateral 08/23/2021   Procedure: XI ROBOTIC ASSISTED BILATERAL SALPINGO OOPHORECTOMY;  Surgeon: Lafonda Mosses, MD;  Location: [facility];  Service: Gynecology;  Laterality: Bilateral;   VULVAR LESION REMOVAL N/A 08/23/2021   Procedure: VAGINAL LESION REMOVAL;  Surgeon: Lafonda Mosses, MD;  Location: [facility];   Service: Gynecology;  Laterality: N/A;    Current Outpatient Medications  Medication Sig Dispense Refill   acetaminophen (TYLENOL) 500 MG tablet Take 500 mg by mouth daily as needed for moderate pain.     acyclovir ointment (ZOVIRAX) 5 % Apply 1 application topically every 3 (three) hours as needed. 30 g 1   alendronate (FOSAMAX) 70 MG tablet Take 1 tablet (70 mg total) by mouth once a week. Take with a full glass of water on an empty stomach. 12 tablet 4   amLODipine (NORVASC) 2.5 MG tablet TAKE ONE TABLET BY MOUTH DAILY 90 tablet 3   atorvastatin (LIPITOR) 10 MG tablet TAKE ONE TABLET BY MOUTH DAILY 90 tablet 3   Cholecalciferol (VITAMIN D3) 2000 units TABS Take 2,000 Units by mouth daily.     No current facility-administered medications for this visit.    Family History  Problem Relation Age of Onset   Ovarian cancer Mother    Lymphoma Mother    Pancreatic cancer Father 77       pancreatic   Cancer Sister        ? type   COPD Sister    Heart disease Sister    Mental illness Sister    Heart disease Sister    Pancreatic cancer Brother    Heart disease Brother 30  MI   Heart disease Paternal Grandfather    Stomach cancer Neg Hx    Throat cancer Neg Hx     Review of Systems  All other systems reviewed and are negative.   Exam:   BP 138/88 (BP Location: Left Arm, Patient Position: Sitting, Cuff Size: Normal)   Pulse (!) 105   Ht '5\' 3"'$  (1.6 m)   Wt 101 lb (45.8 kg)   SpO2 99%   BMI 17.89 kg/m     General appearance: alert, cooperative and appears stated age Head: normocephalic, without obvious abnormality, atraumatic Neck: no adenopathy, supple, symmetrical, trachea midline and thyroid normal to inspection and palpation Lungs: clear to auscultation bilaterally Breasts: normal appearance, no masses or tenderness, No nipple retraction or dimpling, No nipple discharge or bleeding, No axillary adenopathy Heart: regular rate and rhythm Abdomen: soft, non-tender; no  masses, no organomegaly Extremities: extremities normal, atraumatic, no cyanosis or edema Skin: skin color, texture, turgor normal. No rashes or lesions Lymph nodes: cervical, supraclavicular, and axillary nodes normal. Neurologic: grossly normal  Pelvic: External genitalia:  no lesions              No abnormal inguinal nodes palpated.              Urethra:  normal appearing urethra with no masses, tenderness or lesions              Bartholins and Skenes: normal                 Vagina: normal appearing vagina with normal color and discharge, no lesions              Cervix: no lesions              Pap taken: yes Bimanual Exam:  Uterus:  normal size, contour, position, consistency, mobility, non-tender              Adnexa: no mass, fullness, tenderness              Rectal exam:  yes.  Confirms.              Anus:  normal sphincter tone, no lesions  Chaperone was present for exam:  Raquel Sarna  Assessment:   Status post Robotic-assisted laparoscopic bilateral salpingo-oophorectomy, excision of right vaginal mass.  Benign serous cystadenoma of left ovary, simple cyst of right ovary, and Bartholin gland cyst.  Hx small uterine fibroid. Vaginal atrophy. Osteoporosis.  No current fracture. Improved on Fosamax for 2 years.  FH ovarian cancer in mother.  Negative BRCA testing.  FH pancreatic cancer in father and brother.  Patient is followed by GI.  Plan: Mammogram screening discussed. Self breast awareness reviewed. Pap and HR HPV collected. Products to treat atrophy reviewed:  vaginal estrogen, vit E, and cooking oils.  She will try OTC vit E. Guidelines for Calcium, Vitamin D, regular exercise program including cardiovascular and weight bearing exercise. Refill Fosamax for one year. BMD in 2 years. Referral for genetic counseling and testing.  Follow up annually and prn.   After visit summary provided.   30 min  total time was spent for this patient encounter, including preparation,  face-to-face counseling with the patient, coordination of care, and documentation of the encounter in addition to breast and pelvic exam.

## 2022-10-22 ENCOUNTER — Encounter: Payer: Self-pay | Admitting: Family Medicine

## 2022-10-22 NOTE — Telephone Encounter (Signed)
error 

## 2022-10-22 NOTE — Progress Notes (Unsigned)
ACUTE VISIT No chief complaint on file.  HPI: [name] is a 67 y.o. female, who is here today complaining of *** HPI  Review of Systems See other pertinent positives and negatives in HPI.  Current Outpatient Medications on File Prior to Visit  Medication Sig Dispense Refill   acetaminophen (TYLENOL) 500 MG tablet Take 500 mg by mouth daily as needed for moderate pain.     acyclovir ointment (ZOVIRAX) 5 % Apply 1 application topically every 3 (three) hours as needed. 30 g 1   alendronate (FOSAMAX) 70 MG tablet Take 1 tablet (70 mg total) by mouth once a week. Take with a full glass of water on an empty stomach. 12 tablet 4   amLODipine (NORVASC) 2.5 MG tablet TAKE ONE TABLET BY MOUTH DAILY 90 tablet 3   atorvastatin (LIPITOR) 10 MG tablet TAKE ONE TABLET BY MOUTH DAILY 90 tablet 3   Cholecalciferol (VITAMIN D3) 2000 units TABS Take 2,000 Units by mouth daily.     No current facility-administered medications on file prior to visit.    Past Medical History:  Diagnosis Date   Elevated CA 19-9 level    Hyperlipidemia    Hypertension    Kidney stones    Osteoporosis    Pancreatic cyst    Vertigo    Allergies  Allergen Reactions   Aspirin Other (See Comments)    GI upset   Codeine Other (See Comments)    GI upset   Other     Rash to ChloraPrep    Social History   Socioeconomic History   Marital status: Married    Spouse name: Not on file   Number of children: 2   Years of education: Not on file   Highest education level: Not on file  Occupational History   Not on file  Tobacco Use   Smoking status: Former    Types: Cigarettes    Quit date: 04/04/1987    Years since quitting: 35.5   Smokeless tobacco: Never  Vaping Use   Vaping Use: Never used  Substance and Sexual Activity   Alcohol use: Not Currently   Drug use: No   Sexual activity: Not Currently    Partners: Male    Comment: 1st intercourse 36 yo-1 partner  Other Topics Concern   Not on file   Social History Narrative   Not on file   Social Determinants of Health   Financial Resource Strain: Low Risk  (11/14/2021)   Overall Financial Resource Strain (CARDIA)    Difficulty of Paying Living Expenses: Not hard at all  Food Insecurity: No Food Insecurity (11/14/2021)   Hunger Vital Sign    Worried About Running Out of Food in the Last Year: Never true    Ran Out of Food in the Last Year: Never true  Transportation Needs: No Transportation Needs (11/14/2021)   PRAPARE - Hydrologist (Medical): No    Lack of Transportation (Non-Medical): No  Physical Activity: Insufficiently Active (11/14/2021)   Exercise Vital Sign    Days of Exercise per Week: 3 days    Minutes of Exercise per Session: 30 min  Stress: No Stress Concern Present (11/14/2021)   [place]    Feeling of Stress : Not at all  Social Connections: Not on file    There were no vitals filed for this visit. There is no height or weight on file to calculate BMI.  Physical  Exam  ASSESSMENT AND PLAN: There are no diagnoses linked to this encounter.  No follow-ups on file.  Monica Grant G. Martinique, MD  [facility]. [place] office.  Discharge Instructions   None

## 2022-10-23 ENCOUNTER — Ambulatory Visit (INDEPENDENT_AMBULATORY_CARE_PROVIDER_SITE_OTHER): Payer: Medicare Other | Admitting: Family Medicine

## 2022-10-23 ENCOUNTER — Other Ambulatory Visit (HOSPITAL_COMMUNITY)
Admission: RE | Admit: 2022-10-23 | Discharge: 2022-10-23 | Disposition: A | Discharge status: Home / Self Care | Payer: Medicare Other | Source: Ambulatory Visit | Attending: Obstetrics and Gynecology | Admitting: Obstetrics and Gynecology

## 2022-10-23 ENCOUNTER — Encounter: Payer: Self-pay | Admitting: Family Medicine

## 2022-10-23 ENCOUNTER — Encounter: Payer: Self-pay | Admitting: Obstetrics and Gynecology

## 2022-10-23 ENCOUNTER — Ambulatory Visit (INDEPENDENT_AMBULATORY_CARE_PROVIDER_SITE_OTHER): Payer: Medicare Other | Admitting: Obstetrics and Gynecology

## 2022-10-23 VITALS — BP 120/74 | HR 97 | Resp 12 | Ht 63.0 in | Wt 101.0 lb

## 2022-10-23 VITALS — BP 138/88 | HR 105 | Ht 63.0 in | Wt 101.0 lb

## 2022-10-23 DIAGNOSIS — Z124 Encounter for screening for malignant neoplasm of cervix: Secondary | ICD-10-CM | POA: Insufficient documentation

## 2022-10-23 DIAGNOSIS — Z01419 Encounter for gynecological examination (general) (routine) without abnormal findings: Secondary | ICD-10-CM | POA: Diagnosis not present

## 2022-10-23 DIAGNOSIS — B009 Herpesviral infection, unspecified: Secondary | ICD-10-CM | POA: Diagnosis not present

## 2022-10-23 DIAGNOSIS — Z1151 Encounter for screening for human papillomavirus (HPV): Secondary | ICD-10-CM | POA: Insufficient documentation

## 2022-10-23 DIAGNOSIS — M810 Age-related osteoporosis without current pathological fracture: Secondary | ICD-10-CM

## 2022-10-23 DIAGNOSIS — Z8041 Family history of malignant neoplasm of ovary: Secondary | ICD-10-CM

## 2022-10-23 DIAGNOSIS — Z9189 Other specified personal risk factors, not elsewhere classified: Secondary | ICD-10-CM | POA: Diagnosis not present

## 2022-10-23 DIAGNOSIS — Z800 Family history of malignant neoplasm of digestive organs: Secondary | ICD-10-CM

## 2022-10-23 DIAGNOSIS — N390 Urinary tract infection, site not specified: Secondary | ICD-10-CM | POA: Diagnosis not present

## 2022-10-23 DIAGNOSIS — N952 Postmenopausal atrophic vaginitis: Secondary | ICD-10-CM | POA: Diagnosis not present

## 2022-10-23 DIAGNOSIS — R300 Dysuria: Secondary | ICD-10-CM

## 2022-10-23 LAB — POC URINALSYSI DIPSTICK (AUTOMATED)
Bilirubin, UA: NEGATIVE
Blood, UA: POSITIVE
Glucose, UA: NEGATIVE
Ketones, UA: NEGATIVE
Nitrite, UA: NEGATIVE
Protein, UA: NEGATIVE
Urobilinogen, UA: 0.2 E.U./dL
pH, UA: 7 (ref 5.0–8.0)

## 2022-10-23 MED ORDER — ALENDRONATE SODIUM 70 MG PO TABS: 70.0000 mg | ORAL_TABLET | ORAL | 4 refills | Status: DC

## 2022-10-23 MED ORDER — NITROFURANTOIN MONOHYD MACRO 100 MG PO CAPS: 100.0000 mg | ORAL_CAPSULE | Freq: Two times a day (BID) | ORAL | 0 refills | Status: AC

## 2022-10-23 NOTE — Patient Instructions (Signed)

## 2022-10-23 NOTE — Patient Instructions (Signed)
A few things to remember from today's visit:  Dysuria - Plan: POCT Urinalysis Dipstick (Automated), Culture, Urine, Culture, Urine, nitrofurantoin, macrocrystal-monohydrate, (MACROBID) 100 MG capsule  Urinary tract infection without hematuria, site unspecified - Plan: nitrofurantoin, macrocrystal-monohydrate, (MACROBID) 100 MG capsule   Adequate fluid intake, avoid holding urine for long hours, and over the counter Vit C OR cranberry capsules might help.  Today we will treat empirically with antibiotic, which we might need to change when urine culture comes back depending of bacteria susceptibility.  Seek immediate medical attention if severe abdominal pain, vomiting, fever/chills, or worsening symptoms. F/U if symptomatic are not any better after 2-3 days of antibiotic treatment.  If you need refills for medications you take chronically, please call your pharmacy. Do not use My Chart to request refills or for acute issues that need immediate attention. If you send a my chart message, it may take a few days to be addressed, specially if I am not in the office.  Please be sure medication list is accurate. If a new problem present, please set up appointment sooner than planned today.

## 2022-10-24 LAB — URINE CULTURE
MICRO NUMBER:: 14651094
Result:: NO GROWTH
SPECIMEN QUALITY:: ADEQUATE

## 2022-10-26 LAB — CYTOLOGY - PAP
Comment: NEGATIVE
Diagnosis: NEGATIVE
Diagnosis: REACTIVE
High risk HPV: NEGATIVE

## 2022-11-14 ENCOUNTER — Telehealth: Payer: Self-pay | Admitting: Family Medicine

## 2022-11-14 NOTE — Telephone Encounter (Signed)
Contacted Monica Grant to schedule their annual wellness visit. Appointment made for 11/20/22.  Monica Grant AWV direct phone # [phone]   R/s appt from 4/1 to 4/2 Pt aware

## 2022-11-19 ENCOUNTER — Ambulatory Visit: Payer: Medicare Other

## 2022-11-20 ENCOUNTER — Telehealth (INDEPENDENT_AMBULATORY_CARE_PROVIDER_SITE_OTHER): Payer: Medicare Other | Admitting: Family Medicine

## 2022-11-20 DIAGNOSIS — Z0000 Encounter for general adult medical examination without abnormal findings: Secondary | ICD-10-CM | POA: Diagnosis not present

## 2022-11-20 NOTE — Progress Notes (Signed)
PATIENT CHECK-IN and HEALTH RISK ASSESSMENT QUESTIONNAIRE:  -completed by phone/video for upcoming Medicare Preventive Visit  Pre-Visit Check-in: 1)Vitals (height, wt, BP, etc) - record in vitals section for visit on day of visit 2)Review and Update Medications, Allergies PMH, Surgeries, Social history in Epic 3)Hospitalizations in the last year with date/reason? No  4)Review and Update Care Team (patient's specialists) in Epic 5) Complete PHQ9 in Epic  6) Complete Fall Screening in Epic 7)Review all Health Maintenance Due and order under PCP if not done.  8)Medicare Wellness Questionnaire: Answer theses question about your habits: Do you drink alcohol? Yes    If yes, how many drinks do you have a day? rare Have you ever smoked?Yes    Quit date if applicable?  quit 38 years ago How many packs a day do/did you smoke? 1 pack per day Do you use smokeless tobacco?No  Do you use an illicit drugs?No  Do you exercises? Yes   IF so, what type and how many days/minutes per week?Walk, 2 to 3 times per week-1 hour each time, but in the winter she does not  Are you sexually active? No   Number of partners?N/A Typical breakfast: eggs, with wheat toast, corn flakes, with bananas, oatmeal with raisins Typical lunch: leftover from supper or sandwich Typical dinner: seafood, chicken, pork,vegetable, starch Typical snacks:chips,granola bar, trail mix, fruit  Beverages: water, almond milk, Orange Juice sometimes, decaf tea, decaf coffee  Answer theses question about you: Can you perform most household chores?Yes Do you find it hard to follow a conversation in a noisy room?No  Do you often ask people to speak up or repeat themselves?sometimes, difference in left ear Do you feel that you have a problem with memory?no Do you balance your checkbook and or bank acounts?Husband handles Do you feel safe at home?yes  Last dentist visit?4 months ago.   Next appt is June Do you need assistance with any of the  following: Please note if so   Driving?-No  Feeding yourself?-No   Getting from bed to chair?-No   Getting to the toilet?-No   Bathing or showering?-No   Dressing yourself?-No   Managing money?-No   Climbing a flight of stairs-No  Preparing meals?-No   Do you have Advanced Directives in place (Living Will, Healthcare Power or Attorney)? No, she plans to set this up and has the paper work.   Last eye Exam and location?Dec 2023- Dr. Valetta Close, [facility]   Do you currently use prescribed or non-prescribed narcotic or opioid pain medications?No, Tylenol as needed  Do you have a history or close family history of breast, ovarian, tubal or peritoneal cancer or a family member with BRCA (breast cancer susceptibility 1 and 2) gene mutations?  Nurse/Assistant Credentials/time stamp:st   ----------------------------------------------------------------------------------------------------------------------------------------------------------------------------------------------------------------------   MEDICARE ANNUAL PREVENTIVE VISIT WITH PROVIDER: (Welcome to [employer], initial annual wellness or annual wellness exam)  Virtual Visit via Video Phone Note  I connected with Monica Grant on 11/20/22 by a video enabled telemedicine application and verified that I am speaking with the correct person using two identifiers.  Location patient: home Location provider:work or home office Persons participating in the virtual visit: patient, provider  Concerns and/or follow up today: reports all is well. Has an eye sty today - but is doing ok with compresses.   See HM section in Epic for other details of completed HM.    ROS: negative for report of fevers, unintentional weight loss, vision changes, vision loss, hearing loss or change, chest pain,  sob, hemoptysis, melena, hematochezia, hematuria, falls, bleeding or bruising, loc, thoughts of suicide or self harm, memory  loss  Patient-completed extensive health risk assessment - reviewed and discussed with the patient: See Health Risk Assessment completed with patient prior to the visit either above or in recent phone note. This was reviewed in detailed with the patient today and appropriate recommendations, orders and referrals were placed as needed per Summary below and patient instructions.   Review of Medical History: -PMH, PSH, Family History and current specialty and care providers reviewed and updated and listed below   Patient Care Team: [place], Betty G, MD as PCP - General (Family Medicine) Buford Dresser, MD as PCP - Cardiology (Cardiology)   Past Medical History:  Diagnosis Date   Elevated CA 19-9 level    Hyperlipidemia    Hypertension    Kidney stones    Osteoporosis    Pancreatic cyst    Vertigo     Past Surgical History:  Procedure Laterality Date   CARDIAC CATHETERIZATION     COLONOSCOPY     ROBOTIC ASSISTED BILATERAL SALPINGO OOPHERECTOMY Bilateral 08/23/2021   Procedure: XI ROBOTIC ASSISTED BILATERAL SALPINGO OOPHORECTOMY;  Surgeon: Lafonda Mosses, MD;  Location: [facility];  Service: Gynecology;  Laterality: Bilateral;   VULVAR LESION REMOVAL N/A 08/23/2021   Procedure: VAGINAL LESION REMOVAL;  Surgeon: Lafonda Mosses, MD;  Location: [facility];  Service: Gynecology;  Laterality: N/A;    Social History   Socioeconomic History   Marital status: Married    Spouse name: Not on file   Number of children: 2   Years of education: Not on file   Highest education level: Some college, no degree  Occupational History   Not on file  Tobacco Use   Smoking status: Former    Types: Cigarettes    Quit date: 04/04/1987    Years since quitting: 35.6   Smokeless tobacco: Never  Vaping Use   Vaping Use: Never used  Substance and Sexual Activity   Alcohol use: Not Currently   Drug use: No   Sexual activity: Not Currently    Partners: Male    Comment: 1st intercourse 11  yo-1 partner  Other Topics Concern   Not on file  Social History Narrative   Not on file   Social Determinants of Health   Financial Resource Strain: Low Risk  (10/22/2022)   Overall Financial Resource Strain (CARDIA)    Difficulty of Paying Living Expenses: Not hard at all  Food Insecurity: No Food Insecurity (10/22/2022)   Hunger Vital Sign    Worried About Running Out of Food in the Last Year: Never true    [place] in the Last Year: Never true  Transportation Needs: No Transportation Needs (10/22/2022)   PRAPARE - Hydrologist (Medical): No    Lack of Transportation (Non-Medical): No  Physical Activity: Insufficiently Active (10/22/2022)   Exercise Vital Sign    Days of Exercise per Week: 2 days    Minutes of Exercise per Session: 60 min  Stress: No Stress Concern Present (10/22/2022)   [place]    Feeling of Stress : Not at all  Social Connections: [place] (10/22/2022)   Social Connection and Isolation Panel [NHANES]    Frequency of Communication with Friends and Family: Three times a week    Frequency of Social Gatherings with Friends and Family: Once a week    Attends  Religious Services: 1 to 4 times per year    Active Member of Clubs or Organizations: Yes    Attends Archivist Meetings: More than 4 times per year    Marital Status: Married  Human resources officer Violence: Not on file    Family History  Problem Relation Age of Onset   Ovarian cancer Mother    Lymphoma Mother    Pancreatic cancer Father 54       pancreatic   Cancer Sister        ? type   COPD Sister    Heart disease Sister    Mental illness Sister    Heart disease Sister    Pancreatic cancer Brother    Heart disease Brother 75       MI   Heart disease Paternal Grandfather    Stomach cancer Neg Hx    Throat cancer Neg Hx     Current Outpatient Medications on File Prior to Visit   Medication Sig Dispense Refill   acetaminophen (TYLENOL) 500 MG tablet Take 500 mg by mouth daily as needed for moderate pain.     acyclovir ointment (ZOVIRAX) 5 % Apply 1 application topically every 3 (three) hours as needed. 30 g 1   alendronate (FOSAMAX) 70 MG tablet Take 1 tablet (70 mg total) by mouth once a week. Take with a full glass of water on an empty stomach. 12 tablet 4   amLODipine (NORVASC) 2.5 MG tablet TAKE ONE TABLET BY MOUTH DAILY 90 tablet 3   atorvastatin (LIPITOR) 10 MG tablet TAKE ONE TABLET BY MOUTH DAILY 90 tablet 3   Cholecalciferol (VITAMIN D3) 2000 units TABS Take 2,000 Units by mouth daily.     No current facility-administered medications on file prior to visit.    Allergies  Allergen Reactions   Aspirin Other (See Comments)    GI upset   Codeine Other (See Comments)    GI upset   Other     Rash to ChloraPrep       Physical Exam There were no vitals filed for this visit. Estimated body mass index is 17.89 kg/m as calculated from the following:   Height as of 10/23/22: 5\' 3"  (1.6 m).   Weight as of 10/23/22: 101 lb (45.8 kg).  EKG (optional): deferred due to virtual visit  GENERAL: alert, oriented, no acute distress detected, full vision exam deferred due to pandemic and/or virtual encounter   HEENT: atraumatic, conjunttiva clear, no obvious abnormalities on inspection of external nose and ears  NECK: normal movements of the head and neck  LUNGS: on inspection no signs of respiratory distress, breathing rate appears normal, no obvious gross SOB, gasping or wheezing  CV: no obvious cyanosis  MS: moves all visible extremities without noticeable abnormality  PSYCH/NEURO: pleasant and cooperative, no obvious depression or anxiety, speech and thought processing grossly intact, Cognitive function grossly intact  Flowsheet Row Video Visit from 11/20/2022 in [place] at [facility]  PHQ-9 Total Score 0           11/20/2022     4:41 PM 11/20/2022    4:40 PM 08/07/2022    9:54 AM 11/14/2021    3:06 PM 07/11/2021    7:52 AM  Depression screen PHQ 2/9  Decreased Interest 0 0 0 0 0  Down, Depressed, Hopeless 0 0 0 0 0  PHQ - 2 Score 0 0 0 0 0  Altered sleeping 0      Tired, decreased energy  0      Change in appetite 0      Feeling bad or failure about yourself  0      Trouble concentrating 0      Moving slowly or fidgety/restless 0      Suicidal thoughts 0      PHQ-9 Score 0      Difficult doing work/chores Not difficult at all           05/10/2022    5:32 PM 08/07/2022    9:54 AM 10/22/2022    6:10 PM 10/23/2022    8:32 AM 11/20/2022    4:40 PM  Fall Risk  Falls in the past year?  0 0 0 0  Was there an injury with Fall?  0  0 0  Fall Risk Category Calculator  0  0 0  Fall Risk Category (Retired)  Low     (RETIRED) Patient Fall Risk Level Low fall risk Low fall risk     Patient at Risk for Falls Due to  Other (Comment)     Fall risk Follow up  Falls evaluation completed        SUMMARY AND PLAN:  Encounter for Medicare annual wellness exam     Discussed applicable health maintenance/preventive health measures and advised and referred or ordered per patient preferences:  Health Maintenance  Topic Date Due   INFLUENZA VACCINE  03/21/2023   MAMMOGRAM  10/27/2023 - discussed, she plans to call to schedule as does through her ob/gyn provider   Medicare Annual Wellness (AWV)  11/20/2023   DTaP/Tdap/Td (2 - Td or Tdap) 01/15/2028   COLONOSCOPY (Pts 45-45yrs Insurance coverage will need to be confirmed)  03/14/2029   Pneumonia Vaccine 43+ Years old  Completed   DEXA SCAN  Completed, utd   COVID-19 Vaccine  Completed   Hepatitis C Screening  Completed   Zoster Vaccines- Shingrix  Completed   HPV VACCINES  Aged Out   Education and counseling on the following was provided based on the above review of health and a plan/checklist for the patient, along with additional information discussed, was provided for  the patient in the patient instructions :  -Advised on importance of and discussed resources for completing advanced directives, info provided in patient instructions. -Advised and counseled on maintaining healthy weight and healthy lifestyle - including the importance of a healthy diet, regular physical activity, social connections and stress management. -Advised and counseled on a whole foods based healthy diet and regular exercise: discussed a heart healthy, cholesterol lowering whole foods based diet at length. A summary of a healthy diet was provided in the Patient Instructions -reviewed exercise guidelines and recommended regular exercise, discussed options within the community and ways to meet exercise requirements at home  -Advise yearly dental visits at minimum and regular eye exams -Advised and counseled on alcohol limits Follow up: see patient instructions     Patient Instructions  I really enjoyed getting to talk with you today! I am available on Tuesdays and Thursdays for virtual visits if you have any questions or concerns, or if I can be of any further assistance.   CHECKLIST FROM ANNUAL WELLNESS VISIT:  -Follow up (please call to schedule if not scheduled after visit):  -Inperson visit with your Primary Doctor office: -yearly for annual wellness visit with primary care office  Here is a list of your preventive care/health maintenance measures and the plan for each if any are due:  Health Maintenance  Topic Date  Due   INFLUENZA VACCINE  03/21/2023   MAMMOGRAM  10/27/2022 - please call to schedule   Medicare Annual Wellness (AWV)  11/20/2023   DTaP/Tdap/Td (2 - Td or Tdap) 01/15/2028   COLONOSCOPY (Pts 45-50yrs Insurance coverage will need to be confirmed)  03/14/2029   Pneumonia Vaccine 94+ Years old  Completed   DEXA SCAN  Completed   COVID-19 Vaccine  Completed   Hepatitis C Screening  Completed   Zoster Vaccines- Shingrix  Completed   HPV VACCINES  Aged Out     -See a dentist at least yearly  -Get your eyes checked and then per your eye specialist's recommendations  -Other issues addressed today:  -I have included below further information regarding a healthy whole foods based diet, physical activity guidelines for adults, stress management and opportunities for social connections. I hope you find this information useful.   -----------------------------------------------------------------------------------------------------------------------------------------------------------------------------------------------------------------------------------------------------------  NUTRITION: -eat real food: lots of colorful vegetables (half the plate) and fruits -5-7 servings of vegetables and fruits per day (fresh or steamed is best), exp. 2 servings of vegetables with lunch and dinner and 2 servings of fruit per day. Berries and greens such as kale and collards are great choices.  -consume on a regular basis: whole grains (make sure first ingredient on label contains the word "whole"), fresh fruits, fish, nuts, seeds, healthy oils (such as olive oil, avocado oil, grape seed oil) -may eat small amounts of dairy and lean meat on occasion, but avoid processed meats such as ham, bacon, lunch meat, etc. -drink water -try to avoid fast food and pre-packaged foods, processed meat -most experts advise limiting sodium to < 2300mg  per day, should limit further is any chronic conditions such as high blood pressure, heart disease, diabetes, etc. The American Heart Association advised that < 1500mg  is is ideal -try to avoid foods that contain any ingredients with names you do not recognize  -try to avoid sugar/sweets (except for the natural sugar that occurs in fresh fruit) -try to avoid sweet drinks -try to avoid white rice, white bread, pasta (unless whole grain), white or yellow potatoes  EXERCISE GUIDELINES FOR ADULTS: -if you wish to increase your physical  activity, do so gradually and with the approval of your doctor -STOP and seek medical care immediately if you have any chest pain, chest discomfort or trouble breathing when starting or increasing exercise  -move and stretch your body, legs, feet and arms when sitting for long periods -Physical activity guidelines for optimal health in adults: -least 150 minutes per week of aerobic exercise (can talk, but not sing) once approved by your doctor, 20-30 minutes of sustained activity or two 10 minute episodes of sustained activity every day.  -resistance training at least 2 days per week if approved by your doctor -balance exercises 3+ days per week:   Stand somewhere where you have something sturdy to hold onto if you lose balance.    1) lift up on toes, start with 5x per day and work up to 20x   2) stand and lift on leg straight out to the side so that foot is a few inches of the floor, start with 5x each side and work up to 20x each side   3) stand on one foot, start with 5 seconds each side and work up to 20 seconds on each side  If you need ideas or help with getting more active:  -Silver sneakers https://tools.silversneakers.com  -Walk with a Doc: http://stephens-thompson.biz/  -try to include resistance (  weight lifting/strength building) and balance exercises twice per week: or the following link for ideas: ChessContest.fr  UpdateClothing.com.cy  STRESS MANAGEMENT: -can try meditating, or just sitting quietly with deep breathing while intentionally relaxing all parts of your body for 5 minutes daily -if you need further help with stress, anxiety or depression please follow up with your primary doctor or contact the wonderful folks at [place]: [place]: -options in [place] if you wish to engage in more social and exercise related activities:  -Silver  sneakers https://tools.silversneakers.com  -Walk with a Doc: http://stephens-thompson.biz/  -Check out the [place] 50+ section on the [place] of [employer] (hiking clubs, book clubs, cards and games, chess, exercise classes, aquatic classes and much more) - see the website for details: https://www.[place].gov/departments/parks-recreation/active-adults50  -YouTube has lots of exercise videos for different ages and abilities as well  -[place] (a variety of indoor and outdoor inperson activities for adults). [phone]. [address].  -Virtual Online Classes (a variety of topics): see seniorplanet.org or call [phone]  -consider volunteering at a school, hospice center, church, senior center or elsewhere    ADVANCED HEALTHCARE DIRECTIVES:  Everyone should have advanced health care directives in place. This is so that you get the care you want, should you ever be in a situation where you are unable to make your own medical decisions.   From the [place] Advanced Directive Website: "[place] are legal documents in which you give written instructions about your health care if, in the future, you cannot speak for yourself.   A health care power of attorney allows you to name a person you trust to make your health care decisions if you cannot make them yourself. A declaration of a desire for a natural death (or living will) is document, which states that you desire not to have your life prolonged by extraordinary measures if you have a terminal or incurable illness or if you are in a vegetative state. An advance instruction for mental health treatment makes a declaration of instructions, information and preferences regarding your mental health treatment. It also states that you are aware that the advance instruction authorizes a mental health treatment provider to act according to your wishes. It may also outline your consent  or refusal of mental health treatment. A declaration of an anatomical gift allows anyone over the age of 73 to make a gift by will, organ donor card or other document."   Please see the following website or an elder law attorney for forms, FAQs and for completion of advanced directives: [place] Secretary of [place] ([email or web address].no)  Or copy and paste the following to your web browser: PokerReunion.com.cy         Lucretia Kern, DO

## 2022-11-20 NOTE — Patient Instructions (Addendum)
I really enjoyed getting to talk with you today! I am available on Tuesdays and Thursdays for virtual visits if you have any questions or concerns, or if I can be of any further assistance.   CHECKLIST FROM ANNUAL WELLNESS VISIT:  -Follow up (please call to schedule if not scheduled after visit):  -Inperson visit with your Primary Doctor office: -yearly for annual wellness visit with primary care office  Here is a list of your preventive care/health maintenance measures and the plan for each if any are due:  Health Maintenance  Topic Date Due   INFLUENZA VACCINE  03/21/2023   MAMMOGRAM  10/27/2022 - please call to schedule   Medicare Annual Wellness (AWV)  11/20/2023   DTaP/Tdap/Td (2 - Td or Tdap) 01/15/2028   COLONOSCOPY (Pts 45-7yrs Insurance coverage will need to be confirmed)  03/14/2029   Pneumonia Vaccine 6+ Years old  Completed   DEXA SCAN  Completed   COVID-19 Vaccine  Completed   Hepatitis C Screening  Completed   Zoster Vaccines- Shingrix  Completed   HPV VACCINES  Aged Out    -See a dentist at least yearly  -Get your eyes checked and then per your eye specialist's recommendations  -Other issues addressed today:  -I have included below further information regarding a healthy whole foods based diet, physical activity guidelines for adults, stress management and opportunities for social connections. I hope you find this information useful.   -----------------------------------------------------------------------------------------------------------------------------------------------------------------------------------------------------------------------------------------------------------  NUTRITION: -eat real food: lots of colorful vegetables (half the plate) and fruits -5-7 servings of vegetables and fruits per day (fresh or steamed is best), exp. 2 servings of vegetables with lunch and dinner and 2 servings of fruit per day. Berries and greens such as kale and  collards are great choices.  -consume on a regular basis: whole grains (make sure first ingredient on label contains the word "whole"), fresh fruits, fish, nuts, seeds, healthy oils (such as olive oil, avocado oil, grape seed oil) -may eat small amounts of dairy and lean meat on occasion, but avoid processed meats such as ham, bacon, lunch meat, etc. -drink water -try to avoid fast food and pre-packaged foods, processed meat -most experts advise limiting sodium to < 2300mg  per day, should limit further is any chronic conditions such as high blood pressure, heart disease, diabetes, etc. The American Heart Association advised that < 1500mg  is is ideal -try to avoid foods that contain any ingredients with names you do not recognize  -try to avoid sugar/sweets (except for the natural sugar that occurs in fresh fruit) -try to avoid sweet drinks -try to avoid white rice, white bread, pasta (unless whole grain), white or yellow potatoes  EXERCISE GUIDELINES FOR ADULTS: -if you wish to increase your physical activity, do so gradually and with the approval of your doctor -STOP and seek medical care immediately if you have any chest pain, chest discomfort or trouble breathing when starting or increasing exercise  -move and stretch your body, legs, feet and arms when sitting for long periods -Physical activity guidelines for optimal health in adults: -least 150 minutes per week of aerobic exercise (can talk, but not sing) once approved by your doctor, 20-30 minutes of sustained activity or two 10 minute episodes of sustained activity every day.  -resistance training at least 2 days per week if approved by your doctor -balance exercises 3+ days per week:   Stand somewhere where you have something sturdy to hold onto if you lose balance.    1) lift  up on toes, start with 5x per day and work up to 20x   2) stand and lift on leg straight out to the side so that foot is a few inches of the floor, start with 5x  each side and work up to 20x each side   3) stand on one foot, start with 5 seconds each side and work up to 20 seconds on each side  If you need ideas or help with getting more active:  -Silver sneakers https://tools.silversneakers.com  -Walk with a Doc: http://stephens-thompson.biz/  -try to include resistance (weight lifting/strength building) and balance exercises twice per week: or the following link for ideas: ChessContest.fr  UpdateClothing.com.cy  STRESS MANAGEMENT: -can try meditating, or just sitting quietly with deep breathing while intentionally relaxing all parts of your body for 5 minutes daily -if you need further help with stress, anxiety or depression please follow up with your primary doctor or contact the wonderful folks at [place]: [place]: -options in [place] if you wish to engage in more social and exercise related activities:  -Silver sneakers https://tools.silversneakers.com  -Walk with a Doc: http://stephens-thompson.biz/  -Check out the [place] 50+ section on the [place] [employer] (hiking clubs, book clubs, cards and games, chess, exercise classes, aquatic classes and much more) - see the website for details: https://www.[place].gov/departments/parks-recreation/active-adults50  -YouTube has lots of exercise videos for different ages and abilities as well  -[place] (a variety of indoor and outdoor inperson activities for adults). 6052494548. [address]  -Virtual Online Classes (a variety of topics): see seniorplanet.org or call ([phone]) [phone]  -consider volunteering at a school, hospice center, church, senior center or elsewhere    ADVANCED HEALTHCARE DIRECTIVES:  Everyone should have advanced health care directives in place. This is so that you get the care you  want, should you ever be in a situation where you are unable to make your own medical decisions.   From the [place] Advanced Directive Website: "[place] are legal documents in which you give written instructions about your health care if, in the future, you cannot speak for yourself.   A health care power of attorney allows you to name a person you trust to make your health care decisions if you cannot make them yourself. A declaration of a desire for a natural death (or living will) is document, which states that you desire not to have your life prolonged by extraordinary measures if you have a terminal or incurable illness or if you are in a vegetative state. An advance instruction for mental health treatment makes a declaration of instructions, information and preferences regarding your mental health treatment. It also states that you are aware that the advance instruction authorizes a mental health treatment provider to act according to your wishes. It may also outline your consent or refusal of mental health treatment. A declaration of an anatomical gift allows anyone over the age of 33 to make a gift by will, organ donor card or other document."   Please see the following website or an elder law attorney for forms, FAQs and for completion of advanced directives: [place] Secretary of [place] (LocalChronicle.no)  Or copy and paste the following to your web browser: PokerReunion.com.cy

## 2022-12-13 ENCOUNTER — Encounter: Payer: Self-pay | Admitting: Family Medicine

## 2022-12-13 ENCOUNTER — Ambulatory Visit (INDEPENDENT_AMBULATORY_CARE_PROVIDER_SITE_OTHER): Payer: Medicare Other | Admitting: Family Medicine

## 2022-12-13 ENCOUNTER — Telehealth: Payer: Self-pay | Admitting: Family Medicine

## 2022-12-13 VITALS — BP 156/78 | HR 93 | Temp 98.6°F | Wt 103.2 lb

## 2022-12-13 DIAGNOSIS — N23 Unspecified renal colic: Secondary | ICD-10-CM

## 2022-12-13 DIAGNOSIS — R319 Hematuria, unspecified: Secondary | ICD-10-CM | POA: Diagnosis not present

## 2022-12-13 DIAGNOSIS — M549 Dorsalgia, unspecified: Secondary | ICD-10-CM

## 2022-12-13 DIAGNOSIS — R110 Nausea: Secondary | ICD-10-CM

## 2022-12-13 LAB — POCT URINALYSIS DIPSTICK
Bilirubin, UA: NEGATIVE
Glucose, UA: NEGATIVE
Ketones, UA: NEGATIVE
Leukocytes, UA: NEGATIVE
Nitrite, UA: NEGATIVE
Protein, UA: NEGATIVE
Spec Grav, UA: 1.01 (ref 1.010–1.025)
Urobilinogen, UA: NEGATIVE E.U./dL — AB
pH, UA: 6.5 (ref 5.0–8.0)

## 2022-12-13 MED ORDER — TAMSULOSIN HCL 0.4 MG PO CAPS: 0.4000 mg | ORAL_CAPSULE | Freq: Every day | ORAL | 0 refills | Status: DC

## 2022-12-13 MED ORDER — ONDANSETRON HCL 4 MG PO TABS: 4.0000 mg | ORAL_TABLET | Freq: Three times a day (TID) | ORAL | 0 refills | Status: DC | PRN

## 2022-12-13 NOTE — Telephone Encounter (Signed)
Referral [id number] patient was offered Monica Grant and [place], did not want [place] or [place], the next available for Ginette Otto is May 30. Patient does not want to wait that long and didn't want to drive to the other locations.

## 2022-12-13 NOTE — Patient Instructions (Signed)
Prescription for Flomax and Zofran (a medicine for nausea) were sent to your pharmacy.  The order for the CT stone study was also placed.  You should expect a phone call about scheduling this appointment.  In the meantime continue to increase your intake of fluids.  A strainer was provided in the event you are able to capture a stone we can send it to the lab to see what it is made of to get a better idea of how to prevent future stones.

## 2022-12-13 NOTE — Progress Notes (Signed)
Established Patient Office Visit   Subjective  Patient ID: Monica Grant, female    DOB: 1955-12-16  Age: 67 y.o. MRN: [id number]  Chief Complaint  Patient presents with   Back Pain    Started Saturday with some nausea. Checked BP yday, was going to [employer], but felt lightheaded. No emesis, just waves of nausea. Nothing taken for back pain but has tried heat, it helped a little, lying down has helped. 154/80 BP yday, 140/78 was BP today. Temp usually if around 97, has been getting chills.  3/3 was given abx     Patient is a 67 year old female with pmh sig for HTN, pancreatic cyst, ovarian cyst, history of renal calculi, HLD, osteoporosis, elevated CA 19-9, family history of ovarian cancer who is followed by Dr. Swaziland and seen for acute concern.  Patient states she developed low back pain 7 days ago.  Initially thought symptoms were 2/2 muscle strain from cleaning the house.  Patient then developed waves of nausea, right back pain, decreased appetite.  Patient tried heat which helped.  States her temperature is typically 70  F.  pain is difficult to describe but more of an intermittent ache.  Patient notes history of renal calculi.  States she passed a small stone and another was seen in her right kidney but was too large to pass.      ROS Negative unless stated above    Objective:     BP (!) 156/78 (BP Location: Right Arm, Patient Position: Sitting, Cuff Size: Normal)   Pulse 93   Temp 98.6 F (37 C) (Oral)   Wt 103 lb 3.2 oz (46.8 kg)   SpO2 98%   BMI 18.28 kg/m    Physical Exam Constitutional:      General: She is not in acute distress.    Appearance: Normal appearance.  HENT:     Head: Normocephalic and atraumatic.     Nose: Nose normal.     Mouth/Throat:     Mouth: Mucous membranes are moist.  Eyes:     Extraocular Movements: Extraocular movements intact.     Conjunctiva/sclera: Conjunctivae normal.  Cardiovascular:     Rate and Rhythm: Normal rate and regular  rhythm.     Heart sounds: Normal heart sounds. No murmur heard.    No gallop.  Pulmonary:     Effort: Pulmonary effort is normal. No respiratory distress.     Breath sounds: Normal breath sounds. No wheezing, rhonchi or rales.  Abdominal:     General: Bowel sounds are normal.     Palpations: Abdomen is soft. There is no mass.     Tenderness: There is no abdominal tenderness. There is no right CVA tenderness, left CVA tenderness, guarding or rebound.  Musculoskeletal:       Back:     Comments: No TTP of cervical, thoracic, lumbar spine midline.  No TTP of paraspinal muscles.  Lateral lower back with area of tenderness.  No CVA tenderness bilaterally.   Skin:    General: Skin is warm and dry.  Neurological:     Mental Status: She is alert and oriented to person, place, and time.      Results for orders placed or performed in visit on 12/13/22  POCT urinalysis dipstick  Result Value Ref Range   Color, UA clear yellow    Clarity, UA clear    Glucose, UA Negative Negative   Bilirubin, UA neg    Ketones, UA neg    Spec Grav,  UA 1.010 1.010 - 1.025   Blood, UA +    pH, UA 6.5 5.0 - 8.0   Protein, UA Negative Negative   Urobilinogen, UA negative (A) 0.2 or 1.0 E.U./dL   Nitrite, UA n    Leukocytes, UA Negative Negative   Appearance     Odor        Assessment & Plan:  Renal colic -     Tamsulosin HCl; Take 1 capsule (0.4 mg total) by mouth daily.  Dispense: 20 capsule; Refill: 0 -     CT RENAL STONE STUDY; Future  Acute back pain, unspecified back location, unspecified back pain laterality -     POCT urinalysis dipstick -     CT RENAL STONE STUDY; Future  Hematuria, unspecified type -     Urine Culture -     CT RENAL STONE STUDY; Future  Nausea -     Ondansetron HCl; Take 1 tablet (4 mg total) by mouth every 8 (eight) hours as needed for nausea or vomiting.  Dispense: 20 tablet; Refill: 0  UA with 1+ blood, otherwise negative.  Concern for renal colic due to renal  calculi.  Start Flomax.  Zofran for nausea.  Medication.  Patient declines at this time will contact clinic if pain difficult to manage. Tylenol as needed.  Given strainer and hat.  CT stone study and urine culture.  Strict ED precautions.  Return if symptoms worsen or fail to improve.   Deeann Saint, MD

## 2022-12-14 LAB — URINE CULTURE
MICRO NUMBER:: 14874091
Result:: NO GROWTH
SPECIMEN QUALITY:: ADEQUATE

## 2022-12-18 NOTE — Telephone Encounter (Signed)
Pt is scheduled for 12/20/22

## 2022-12-18 NOTE — Telephone Encounter (Signed)
Pt scheduled for 12/20/22

## 2022-12-20 ENCOUNTER — Ambulatory Visit
Admission: RE | Admit: 2022-12-20 | Discharge: 2022-12-20 | Disposition: A | Discharge status: Home / Self Care | Payer: Medicare Other | Source: Ambulatory Visit | Attending: Family Medicine | Admitting: Family Medicine

## 2022-12-20 DIAGNOSIS — N23 Unspecified renal colic: Secondary | ICD-10-CM

## 2022-12-20 DIAGNOSIS — R319 Hematuria, unspecified: Secondary | ICD-10-CM

## 2022-12-20 DIAGNOSIS — M549 Dorsalgia, unspecified: Secondary | ICD-10-CM

## 2022-12-28 ENCOUNTER — Telehealth: Payer: Self-pay | Admitting: Family Medicine

## 2022-12-28 NOTE — Telephone Encounter (Signed)
Pt can continue medication if still having pain to help with passage of kidney stones.  If not having pain can stop med.  Follow up with pcp.

## 2022-12-28 NOTE — Telephone Encounter (Signed)
Pt is calling has viewed ct renal stone test and would like to know if she should continue to take medication. Pt saw dr banks

## 2022-12-30 ENCOUNTER — Other Ambulatory Visit: Payer: Self-pay | Admitting: Family Medicine

## 2022-12-30 DIAGNOSIS — N23 Unspecified renal colic: Secondary | ICD-10-CM

## 2022-12-31 NOTE — Telephone Encounter (Signed)
Spoke with pt, is aware of previous note.

## 2023-06-21 ENCOUNTER — Encounter: Payer: Self-pay | Admitting: Family Medicine

## 2023-06-26 ENCOUNTER — Ambulatory Visit (INDEPENDENT_AMBULATORY_CARE_PROVIDER_SITE_OTHER): Payer: Medicare Other | Admitting: Family Medicine

## 2023-06-26 ENCOUNTER — Encounter: Payer: Self-pay | Admitting: Family Medicine

## 2023-06-26 VITALS — BP 126/80 | HR 73 | Temp 97.9°F | Wt 103.4 lb

## 2023-06-26 DIAGNOSIS — S96911A Strain of unspecified muscle and tendon at ankle and foot level, right foot, initial encounter: Secondary | ICD-10-CM | POA: Diagnosis not present

## 2023-06-26 NOTE — Progress Notes (Signed)
   Subjective:    Patient ID: Monica Grant, female    DOB: [birth year], 67 y.o.   MRN: [id number]  HPI Here for a pain in the right foot that started 3 and 1/2 weeks ago after she worked in her garden for several hours. She was crouching and stooping most of the time. The foot never swelled or turned red or got warm. Yesterday she stayed off of it all day and iced it, and now today it feels much better.    Review of Systems  Constitutional: Negative.   Respiratory: Negative.    Cardiovascular: Negative.   Musculoskeletal:  Positive for arthralgias.       Objective:   Physical Exam Constitutional:      Appearance: Normal appearance.     Comments: Walks normally   Cardiovascular:     Rate and Rhythm: Normal rate and regular rhythm.     Pulses: Normal pulses.     Heart sounds: Normal heart sounds.  Pulmonary:     Effort: Pulmonary effort is normal.     Breath sounds: Normal breath sounds.  Musculoskeletal:     Comments: Right foot appears normal with no swelling. She is tender in the area just anterior to the medial malleolus. ROM is full   Neurological:     Mental Status: She is alert.           Assessment & Plan:  Foot strain. This should heal over time. She will continue to rest it and ice it. Recheck as needed. Gershon Crane, MD

## 2023-06-28 ENCOUNTER — Ambulatory Visit (INDEPENDENT_AMBULATORY_CARE_PROVIDER_SITE_OTHER): Payer: Medicare Other | Admitting: Cardiology

## 2023-06-28 ENCOUNTER — Encounter (HOSPITAL_BASED_OUTPATIENT_CLINIC_OR_DEPARTMENT_OTHER): Payer: Self-pay | Admitting: Cardiology

## 2023-06-28 VITALS — BP 122/82 | HR 70 | Ht 62.0 in | Wt 104.0 lb

## 2023-06-28 DIAGNOSIS — E785 Hyperlipidemia, unspecified: Secondary | ICD-10-CM | POA: Diagnosis not present

## 2023-06-28 DIAGNOSIS — Z87898 Personal history of other specified conditions: Secondary | ICD-10-CM | POA: Diagnosis not present

## 2023-06-28 DIAGNOSIS — I10 Essential (primary) hypertension: Secondary | ICD-10-CM

## 2023-06-28 DIAGNOSIS — Z8249 Family history of ischemic heart disease and other diseases of the circulatory system: Secondary | ICD-10-CM

## 2023-06-28 DIAGNOSIS — Z7189 Other specified counseling: Secondary | ICD-10-CM

## 2023-06-28 NOTE — Progress Notes (Signed)
Cardiology Office Note:  .   Date:  06/28/2023  ID:  Monica Grant, DOB 1956-06-29, MRN [id number] PCP: Swazil[place], Betty G, MD  [facility] Providers Cardiologist:  Jodelle Red, MD {  History of Present Illness: .   Monica Grant is a 67 y.o. female with a hx of hypertension, hyperlipidemia who is seen for follow-up. I initially met her 05/08/18 as a new consult at the request of [place], Timoteo Expose, MD for the evaluation and management of palpitations.   -Family history: brother died of MI at age 67 (smoker, poor health). Sister died of COPD (smoker, drug use). Another sister had cancer and had heart issues, had a 3V CABG (tobacco, alcohol use). All with poor diets. Two other sisters in good health.  Today: Has been more aware of her muscles after having , noticed she favored one side and has been working to make them more even. No significant pain, some mild MSK with activity.  Has occasional lightheadedness, had some presyncope with pain (pressure on an eye sty). Reviewed how to manage this.  ROS: Denies chest pain, shortness of breath at rest or with normal exertion. No PND, orthopnea, LE edema or unexpected weight gain. No syncope or palpitations. ROS otherwise negative except as noted.   Studies Reviewed: Marland Kitchen    EKG:  EKG Interpretation Date/Time:  Friday June 28 2023 09:05:25 EST Ventricular Rate:  70 PR Interval:  134 QRS Duration:  106 QT Interval:  392 QTC Calculation: 423 R Axis:   76  Text Interpretation: Normal sinus rhythm Possible Left atrial enlargement Incomplete right bundle branch block Confirmed by Jodelle Red [phone]) on 06/28/2023 9:45:22 AM    Physical Exam:   VS:  BP 122/82 (BP Location: Right Arm, Patient Position: Sitting, Cuff Size: Normal)   Pulse 70   Ht 5\' 2"  (1.575 m)   Wt 104 lb (47.2 kg)   BMI 19.02 kg/m    Wt Readings from Last 3 Encounters:  06/28/23 104 lb (47.2 kg)  06/26/23 103 lb 6.4 oz (46.9 kg)  12/13/22 103 lb  3.2 oz (46.8 kg)    GEN: Well nourished, well developed in no acute distress HEENT: Normal, moist mucous membranes NECK: No JVD CARDIAC: regular rhythm, normal S1 and S2, no rubs or gallops. No murmur. VASCULAR: Radial and DP pulses 2+ bilaterally. No carotid bruits RESPIRATORY:  Clear to auscultation without rales, wheezing or rhonchi  ABDOMEN: Soft, non-tender, non-distended MUSCULOSKELETAL:  Ambulates independently SKIN: Warm and dry, no edema NEUROLOGIC:  Alert and oriented x 3. No focal neuro deficits noted. PSYCHIATRIC:  Normal affect    ASSESSMENT AND PLAN: .    Chest pain: Has not recurred. Prior ER evaluation unremarkable. Able to exercise without symptoms/limitations. Discussed that if chest pain recurs, I would recommend pursuing additional testing. Reviewed noncardiac causes of chest pain as well.   Hypertension: well controlled at home -continue amlodipine 2.5 mg daily  Hyperlipidemia Family history of heart disease -well controlled on atorvastatin  CV risk counseling and prevention -recommend heart healthy/Mediterranean diet, with whole grains, fruits, vegetable, fish, lean meats, nuts, and olive oil. Limit salt. -recommend moderate walking, 3-5 times/week for 30-50 minutes each session. Aim for at least 150 minutes.week. Goal should be pace of 3 miles/hours, or walking 1.5 miles in 30 minutes -recommend avoidance of tobacco products. Avoid excess alcohol. -ASCVD risk score: The 10-year ASCVD risk score (Arnett DK, et al., 2019) is: 7%   Values used to calculate the score:  Age: 43 years     Sex: Female     Is Non-Hispanic African American: No     Diabetic: No     Tobacco smoker: No     Systolic Blood Pressure: 122 mmHg     Is BP treated: Yes     HDL Cholesterol: 88.3 mg/dL     Total Cholesterol: 186 mg/dL    Dispo: 1 year or sooner as needed  Signed, Jodelle Red, MD   Jodelle Red, MD, PhD, [place]  [facility]  [facility] at [facility] [address] [place], [place] [address] [phone]

## 2023-06-28 NOTE — Patient Instructions (Signed)

## 2023-08-07 NOTE — Progress Notes (Signed)
HPI: [name] is a 67 y.o. female with a PMHx significant for pancreatic cyst, HLD, HTN, and osteoporosis, who is here today for her routine physical.  Last CPE: 08/07/2022 She has seen cardiology and gastroenterology since her last visit.  Exercise: Patient states she has been walking about once per week, but is active in the house. She is more active during warmer weather. Diet: She says she is eating healthy in general. She eats vegetables daily, and eats mainly chicken and fish.  Sleep: 7-8 hours per night.  Alcohol Use: She rarely drinks.  Smoking: She quit smoking in 1988.  Vision: UTD on routine vision care. She mentions she has a stye on her eye right now.  Dental: UTD on routine dental care. She had an appointment last week.   Immunization History  Administered Date(s) Administered   Influenza, High Dose Seasonal PF 07/07/2022   Influenza,inj,Quad PF,6+ Mos 07/05/2020   Influenza-Unspecified 07/07/2021, 06/21/2023   Moderna Covid-19 Vaccine Bivalent Booster 28yrs & up 06/06/2021   PFIZER(Purple Top)SARS-COV-2 Vaccination 11/06/2019, 12/01/2019, 06/10/2020, 12/22/2020, 06/21/2023   PNEUMOCOCCAL CONJUGATE-20 08/07/2022   Pfizer(Comirnaty)Fall Seasonal Vaccine 12 years and older 05/29/2022   Tdap 01/14/2018   Zoster Recombinant(Shingrix) 09/26/2017, 01/30/2018   Health Maintenance  Topic Date Due   COVID-19 Vaccine (8 - 2024-25 season) 08/16/2023   MAMMOGRAM  10/27/2023   Medicare Annual Wellness (AWV)  11/20/2023   DTaP/Tdap/Td (2 - Td or Tdap) 01/15/2028   Colonoscopy  03/14/2029   Pneumonia Vaccine 11+ Years old  Completed   INFLUENZA VACCINE  Completed   DEXA SCAN  Completed   Hepatitis C Screening  Completed   Zoster Vaccines- Shingrix  Completed   HPV VACCINES  Aged Out   Chronic medical problems:   Hypertension:  Medications: Currently on amlodipine 2.5 mg daily.  She has been checking her BP regularly at home and says her readings are in the  120s/70s.  Side effects: none Negative for unusual or severe headache, visual changes, exertional chest pain, dyspnea,  focal weakness, or edema.  Lab Results  Component Value Date   CREATININE 0.65 08/07/2022   BUN 9 08/07/2022   NA 141 08/07/2022   K 4.2 08/07/2022   CL 103 08/07/2022   CO2 30 08/07/2022   Hyperlipidemia: Currently on atorvastatin 10 mg daily.  Side effects from medication: none Lab Results  Component Value Date   CHOL 186 08/07/2022   HDL 88.30 08/07/2022   LDLCALC 85 08/07/2022   TRIG 61.0 08/07/2022   CHOLHDL 2 08/07/2022   Osteoporosis:  She has been taking Fosamax 70 mg weekly for 3 years.  She takes vitamin D supplements, and is trying to get calcium from her diet.  Follows with gyn.  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  Concerns today:   -She mentions she had a recent cold with some ear ache in her right ear, it has improved. Negative for ear drainage or changes in hearing.  -Since her last visit she was seen for acute back pain, thought to be renal colic. Abdominal CT done on 12/24/22:  1. Nonobstructing right nephrolithiasis. No hydronephrosis or obstructive uropathy. 2. 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 This is too small for accurate characterization, also incompletely characterized on this unenhanced exam, however unchanged in size from prior MRIs favoring a nonaggressive/benign process. 3. Sigmoid colonic diverticulosis without diverticulitis. 4. Minimal  wall thickening at the gastroesophageal junction, can be seen with reflux or esophagitis. 5. The small cystic lesions in the pancreas on prior MRI are not well-defined on this unenhanced exam.  Aortic Atherosclerosis (ICD10-I70.0)  Review of Systems  Constitutional:  Negative for activity change, appetite change and fever.  HENT:  Negative for mouth sores, sore  throat and trouble swallowing.   Eyes:  Negative for redness and visual disturbance.  Respiratory:  Negative for cough, shortness of breath and wheezing.   Cardiovascular:  Negative for chest pain and leg swelling.  Gastrointestinal:  Negative for abdominal pain, nausea and vomiting.       No changes in bowel habits.  Endocrine: Negative for cold intolerance, heat intolerance, polydipsia, polyphagia and polyuria.  Genitourinary:  Negative for decreased urine volume, dysuria, hematuria, vaginal bleeding and vaginal discharge.  Musculoskeletal:  Negative for gait problem and myalgias.  Skin:  Negative for color change and rash.  Allergic/Immunologic: Negative for environmental allergies.  Neurological:  Negative for syncope, weakness and headaches.  Hematological:  Negative for adenopathy. Does not bruise/bleed easily.  Psychiatric/Behavioral:  Negative for confusion and hallucinations.   All other systems reviewed and are negative.  Current Outpatient Medications on File Prior to Visit  Medication Sig Dispense Refill   acetaminophen (TYLENOL) 500 MG tablet Take 500 mg by mouth daily as needed for moderate pain.     acyclovir ointment (ZOVIRAX) 5 % Apply 1 application topically every 3 (three) hours as needed. 30 g 1   alendronate (FOSAMAX) 70 MG tablet Take 1 tablet (70 mg total) by mouth once a week. Take with a full glass of water on an empty stomach. 12 tablet 4   amLODipine (NORVASC) 2.5 MG tablet TAKE ONE TABLET BY MOUTH DAILY 90 tablet 3   atorvastatin (LIPITOR) 10 MG tablet TAKE ONE TABLET BY MOUTH DAILY 90 tablet 3   Cholecalciferol (VITAMIN D3) 2000 units TABS Take 2,000 Units by mouth daily.     ondansetron (ZOFRAN) 4 MG tablet Take 1 tablet (4 mg total) by mouth every 8 (eight) hours as needed for nausea or vomiting. 20 tablet 0   No current facility-administered medications on file prior to visit.   Past Medical History:  Diagnosis Date   Elevated CA 19-9 level     Hyperlipidemia    Hypertension    Kidney stones    Osteoporosis    Pancreatic cyst    Vertigo    Past Surgical History:  Procedure Laterality Date   CARDIAC CATHETERIZATION     COLONOSCOPY     ROBOTIC ASSISTED BILATERAL SALPINGO OOPHERECTOMY Bilateral 08/23/2021   Procedure: XI ROBOTIC ASSISTED BILATERAL SALPINGO OOPHORECTOMY;  Surgeon: Carver Fila, MD;  Location: [facility];  Service: Gynecology;  Laterality: Bilateral;   VULVAR LESION REMOVAL N/A 08/23/2021   Procedure: VAGINAL LESION REMOVAL;  Surgeon: Carver Fila, MD;  Location: [facility];  Service: Gynecology;  Laterality: N/A;   Allergies  Allergen Reactions   Aspirin Other (See Comments)    GI upset   Codeine Other (See Comments)    GI upset   Other     Rash to ChloraPrep   Family History  Problem Relation Age of Onset   Ovarian cancer Mother    Lymphoma Mother    Pancreatic cancer Father 4       pancreatic   Cancer Sister        ? type   COPD Sister    Heart disease Sister    Mental  illness Sister    Heart disease Sister    Pancreatic cancer Brother    Heart disease Brother 18       MI   Heart disease Paternal Grandfather    Stomach cancer Neg Hx    Throat cancer Neg Hx     Social History   Socioeconomic History   Marital status: Married    Spouse name: Not on file   Number of children: 2   Years of education: Not on file   Highest education level: Some college, no degree  Occupational History   Not on file  Tobacco Use   Smoking status: Former    Current packs/day: 0.00    Types: Cigarettes    Quit date: 04/04/1987    Years since quitting: 36.3   Smokeless tobacco: Never  Vaping Use   Vaping status: Never Used  Substance and Sexual Activity   Alcohol use: Not Currently   Drug use: No   Sexual activity: Not Currently    Partners: Male    Comment: 1st intercourse 59 yo-1 partner  Other Topics Concern   Not on file  Social History Narrative   Not on file   Social Drivers of Health    Financial Resource Strain: Low Risk  (06/25/2023)   Overall Financial Resource Strain (CARDIA)    Difficulty of Paying Living Expenses: Not hard at all  Food Insecurity: No Food Insecurity (06/25/2023)   Hunger Vital Sign    Worried About Running Out of Food in the Last Year: Never true    Ran Out of Food in the Last Year: Never true  Transportation Needs: No Transportation Needs (06/25/2023)   PRAPARE - Administrator, Civil Service (Medical): No    Lack of Transportation (Non-Medical): No  Physical Activity: Sufficiently Active (06/25/2023)   Exercise Vital Sign    Days of Exercise per Week: 3 days    Minutes of Exercise per Session: 60 min  Stress: No Stress Concern Present (06/25/2023)   [employer] of Occupational Health - Occupational Stress Questionnaire    Feeling of Stress : Not at all  Social Connections: Socially Integrated (06/25/2023)   Social Connection and Isolation Panel [NHANES]    Frequency of Communication with Friends and Family: Three times a week    Frequency of Social Gatherings with Friends and Family: Once a week    Attends Religious Services: 1 to 4 times per year    Active Member of [employer] or Organizations: Yes    Attends Engineer, structural: More than 4 times per year    Marital Status: Married   Today's Vitals   08/12/23 0748  BP: 120/70  Pulse: 94  Resp: 16  Temp: 97.8 F (36.6 C)  TempSrc: Oral  SpO2: 99%  Weight: 106 lb (48.1 kg)  Height: 5\' 2"  (1.575 m)   Body mass index is 19.39 kg/m.  Wt Readings from Last 3 Encounters:  08/12/23 106 lb (48.1 kg)  06/28/23 104 lb (47.2 kg)  06/26/23 103 lb 6.4 oz (46.9 kg)   Physical Exam Vitals and nursing note reviewed.  Constitutional:      General: She is not in acute distress.    Appearance: She is well-developed.  HENT:     Head: Normocephalic and atraumatic.     Right Ear: Ear canal and external ear normal. A middle ear effusion is present. Tympanic membrane is not  erythematous.     Left Ear: Tympanic membrane, ear canal and external  ear normal.     Mouth/Throat:     Mouth: Mucous membranes are moist.     Pharynx: Oropharynx is clear. Uvula midline.  Eyes:     Extraocular Movements: Extraocular movements intact.     Conjunctiva/sclera: Conjunctivae normal.     Pupils: Pupils are equal, round, and reactive to light.  Neck:     Thyroid: No thyroid mass or thyromegaly.     Trachea: No tracheal deviation.  Cardiovascular:     Rate and Rhythm: Normal rate and regular rhythm.     Pulses:          Posterior tibial pulses are 2+ on the right side and 2+ on the left side.     Heart sounds: No murmur heard. Pulmonary:     Effort: Pulmonary effort is normal. No respiratory distress.     Breath sounds: Normal breath sounds.  Abdominal:     Palpations: Abdomen is soft. There is no hepatomegaly or mass.     Tenderness: There is no abdominal tenderness.  Genitourinary:    Comments: Deferred to gyn. Musculoskeletal:     Right lower leg: No edema.     Left lower leg: No edema.     Comments: No signs of synovitis appreciated.  Lymphadenopathy:     Cervical: No cervical adenopathy.     Upper Body:     Right upper body: No supraclavicular adenopathy.     Left upper body: No supraclavicular adenopathy.  Skin:    General: Skin is warm.     Findings: No erythema or rash.  Neurological:     General: No focal deficit present.     Mental Status: She is alert and oriented to person, place, and time.     Cranial Nerves: No cranial nerve deficit.     Sensory: No sensory deficit.     Gait: Gait normal.     Deep Tendon Reflexes:     Reflex Scores:      Bicep reflexes are 2+ on the right side and 2+ on the left side.      Patellar reflexes are 2+ on the right side and 2+ on the left side. Psychiatric:        Mood and Affect: Mood and affect normal.    ASSESSMENT AND PLAN:  [name] was here today for her annual physical examination.  Lab Results   Component Value Date   CHOL 167 08/12/2023   HDL 75.90 08/12/2023   LDLCALC 79 08/12/2023   TRIG 59.0 08/12/2023   CHOLHDL 2 08/12/2023   Lab Results  Component Value Date   NA 142 08/12/2023   CL 103 08/12/2023   K 3.5 08/12/2023   CO2 31 08/12/2023   BUN 12 08/12/2023   CREATININE 0.69 08/12/2023   GFR 89.57 08/12/2023   CALCIUM 10.0 08/12/2023   ALBUMIN 4.6 08/12/2023   GLUCOSE 87 08/12/2023   Lab Results  Component Value Date   WBC 6.2 08/12/2023   HGB 14.9 08/12/2023   HCT 44.2 08/12/2023   MCV 93.1 08/12/2023   PLT 270.0 08/12/2023   Lab Results  Component Value Date   ALT 12 08/12/2023   AST 20 08/12/2023   ALKPHOS 83 08/12/2023   BILITOT 0.6 08/12/2023   Lab Results  Component Value Date   TSH 1.33 08/12/2023   Routine general medical examination at a health care facility Assessment & Plan: We discussed the importance of regular physical activity and healthy diet for prevention of chronic illness  and/or complications. Preventive guidelines reviewed. Vaccination up to date. Adequate vit D and calcium intake to continue. Following with gyn for her female preventive care. Next CPE in a year.   Hyperlipidemia, unspecified hyperlipidemia type Assessment & Plan: Continue atorvastatin 10 mg daily, which she has tolerated well, and low-fat diet. Further recommendation will be given according to lipid panel result.  Orders: -     Comprehensive metabolic panel; Future -     Lipid panel; Future  Hypertension, essential, benign Assessment & Plan: BP adequately controlled here today and at home. Continue amlodipine 2.5 mg daily and low-salt diet. Continue monitoring BP at home regularly. Her eye exam is current. F/U in a year as far as BP is stable.  Orders: -     Comprehensive metabolic panel; Future -     TSH; Future -     CBC; Future  Angiomyolipoma of left kidney Assessment & Plan: 8 mm lower pole left kidney seen on abdominal CT. It seems  stable when compared with prior abdominal imaging and asymptomatic. Lesion can be continued to follow with abdominal MRI she has every 2 years to follow on pancreatic cyst. CBC added to labs today.   Atherosclerosis of aorta [facility]) Assessment & Plan: Seen on abdominal CT in 12/2022. Currently on Atorvastatin 10 mg daily. Last LDL 85 in 07/2022.   In regard to right earache, it has improved. Mild middle ear affusion. Auto inflation maneuvers a few times through the day recommended. Monitor for worsening symptoms.  Return in 1 year (on 08/11/2024) for CPE, chronic problems, Labs.  I, Suanne Marker, acting as a scribe for Alleene Stoy [place], MD., have documented all relevant documentation on the behalf of Nicklaus Alviar [place], MD, as directed by  Mackenzy Grumbine [place], MD while in the presence of Lovell Roe [place], MD.   I, Suanne Marker, have reviewed all documentation for this visit. The documentation on 08/07/23 for the exam, diagnosis, procedures, and orders are all accurate and complete.  Laramie Meissner G. [place], MD  [facility] [place] office.

## 2023-08-12 ENCOUNTER — Ambulatory Visit: Payer: Medicare Other | Admitting: Family Medicine

## 2023-08-12 ENCOUNTER — Encounter: Payer: Self-pay | Admitting: Family Medicine

## 2023-08-12 VITALS — BP 120/70 | HR 94 | Temp 97.8°F | Resp 16 | Ht 62.0 in | Wt 106.0 lb

## 2023-08-12 DIAGNOSIS — I700 Atherosclerosis of aorta: Secondary | ICD-10-CM | POA: Insufficient documentation

## 2023-08-12 DIAGNOSIS — I10 Essential (primary) hypertension: Secondary | ICD-10-CM

## 2023-08-12 DIAGNOSIS — E785 Hyperlipidemia, unspecified: Secondary | ICD-10-CM | POA: Diagnosis not present

## 2023-08-12 DIAGNOSIS — D1771 Benign lipomatous neoplasm of kidney: Secondary | ICD-10-CM | POA: Diagnosis not present

## 2023-08-12 DIAGNOSIS — Z0000 Encounter for general adult medical examination without abnormal findings: Secondary | ICD-10-CM

## 2023-08-12 LAB — LIPID PANEL
Cholesterol: 167 mg/dL (ref 0–200)
HDL: 75.9 mg/dL (ref >39.00)
LDL Cholesterol: 79 mg/dL (ref 0–99)
NonHDL: 91
Total CHOL/HDL Ratio: 2
Triglycerides: 59 mg/dL (ref 0.0–149.0)
VLDL: 11.8 mg/dL (ref 0.0–40.0)

## 2023-08-12 LAB — COMPREHENSIVE METABOLIC PANEL
ALT: 12 U/L (ref 0–35)
AST: 20 U/L (ref 0–37)
Albumin: 4.6 g/dL (ref 3.5–5.2)
Alkaline Phosphatase: 83 U/L (ref 39–117)
BUN: 12 mg/dL (ref 6–23)
CO2: 31 meq/L (ref 19–32)
Calcium: 10 mg/dL (ref 8.4–10.5)
Chloride: 103 meq/L (ref 96–112)
Creatinine, Ser: 0.69 mg/dL (ref 0.40–1.20)
GFR: 89.57 mL/min (ref >60.00)
Glucose, Bld: 87 mg/dL (ref 70–99)
Potassium: 3.5 meq/L (ref 3.5–5.1)
Sodium: 142 meq/L (ref 135–145)
Total Bilirubin: 0.6 mg/dL (ref 0.2–1.2)
Total Protein: 7.8 g/dL (ref 6.0–8.3)

## 2023-08-12 LAB — CBC
HCT: 44.2 % (ref 36.0–46.0)
Hemoglobin: 14.9 g/dL (ref 12.0–15.0)
MCHC: 33.7 g/dL (ref 30.0–36.0)
MCV: 93.1 fL (ref 78.0–100.0)
Platelets: 270 10*3/uL (ref 150.0–400.0)
RBC: 4.75 Mil/uL (ref 3.87–5.11)
RDW: 13.5 % (ref 11.5–15.5)
WBC: 6.2 10*3/uL (ref 4.0–10.5)

## 2023-08-12 LAB — TSH: TSH: 1.33 u[IU]/mL (ref 0.35–5.50)

## 2023-08-12 NOTE — Assessment & Plan Note (Signed)
BP adequately controlled here today and at home. Continue amlodipine 2.5 mg daily and low-salt diet. Continue monitoring BP at home regularly. Her eye exam is current. F/U in a year as far as BP is stable.

## 2023-08-12 NOTE — Patient Instructions (Addendum)
A few things to remember from today's visit:  Routine general medical examination at a health care facility  Hyperlipidemia, unspecified hyperlipidemia type - Plan: Comprehensive metabolic panel, Lipid panel  Hypertension, essential, benign - Plan: Comprehensive metabolic panel, TSH, CBC  If you need refills for medications you take chronically, please call your pharmacy. Do not use My Chart to request refills or for acute issues that need immediate attention. If you send a my chart message, it may take a few days to be addressed, specially if I am not in the office.  Please be sure medication list is accurate. If a new problem present, please set up appointment sooner than planned today.

## 2023-08-15 NOTE — Assessment & Plan Note (Signed)
Seen on abdominal CT in 12/2022. Currently on Atorvastatin 10 mg daily. Last LDL 85 in 07/2022.

## 2023-08-15 NOTE — Assessment & Plan Note (Addendum)
Continue atorvastatin 10 mg daily, which she has tolerated well, and low-fat diet. Further recommendation will be given according to lipid panel result.

## 2023-08-15 NOTE — Assessment & Plan Note (Signed)
We discussed the importance of regular physical activity and healthy diet for prevention of chronic illness and/or complications. Preventive guidelines reviewed. Vaccination up to date. Adequate vit D and calcium intake to continue. Following with gyn for her female preventive care. Next CPE in a year.

## 2023-08-15 NOTE — Assessment & Plan Note (Signed)
8 mm lower pole left kidney seen on abdominal CT. It seems stable when compared with prior abdominal imaging and asymptomatic. Lesion can be continued to follow with abdominal MRI she has every 2 years to follow on pancreatic cyst. CBC added to labs today.

## 2023-11-12 ENCOUNTER — Telehealth: Payer: Self-pay

## 2023-11-12 NOTE — Telephone Encounter (Signed)
 Received medication refill request dated 11/11/2023 for Alendronate Sodium (Fosamax) 70 mg Tab #12. This request has already been filled on 10/23/2023 to [facility] ([address], [place])

## 2023-11-13 ENCOUNTER — Other Ambulatory Visit: Payer: Self-pay

## 2023-11-13 NOTE — Telephone Encounter (Signed)
 Med refill request: Alendronate Sodium Last AEX: 10/23/2022 BS Next AEX: not yet scheduled, Last MMG (if hormonal med) n/a Refill authorized: Last Rx sent #12 with 4 refills on 10/23/2022. Please approve or deny as appropriate.

## 2023-11-13 NOTE — Telephone Encounter (Signed)
 Message entered in error.   Rx refill was submitted to pharmacy.

## 2023-11-14 MED ORDER — ALENDRONATE SODIUM 70 MG PO TABS: 70.0000 mg | ORAL_TABLET | ORAL | 0 refills | Status: DC

## 2023-11-14 NOTE — Telephone Encounter (Signed)
 Med refill request: Alendronate Sodium Last AEX: 10/23/2022 BS Next AEX: not yet scheduled, Last MMG (if hormonal med) n/a Refill authorized: Last Rx sent #12 with 4 refills on 10/23/2022. Please approve or deny as appropriate.

## 2023-11-19 ENCOUNTER — Telehealth: Payer: Self-pay | Admitting: Gastroenterology

## 2023-11-19 ENCOUNTER — Other Ambulatory Visit: Payer: Self-pay

## 2023-11-19 DIAGNOSIS — K862 Cyst of pancreas: Secondary | ICD-10-CM

## 2023-11-19 NOTE — Telephone Encounter (Signed)
 Spoke with the patient. MRI of the abd w/wo for follow up of known pancreatitic cyst is due in June. Order placed and indicated she is due for June of this year.

## 2023-11-19 NOTE — Telephone Encounter (Signed)
 Inbound call from patient about scheduling an MRI FU. Please advise.

## 2023-11-19 NOTE — Addendum Note (Signed)
 Addended by: Heber Sussex A on: 11/19/2023 11:40 AM   Modules accepted: Orders

## 2023-12-25 ENCOUNTER — Other Ambulatory Visit: Payer: Self-pay | Admitting: Family Medicine

## 2023-12-25 DIAGNOSIS — I10 Essential (primary) hypertension: Secondary | ICD-10-CM

## 2023-12-25 DIAGNOSIS — E785 Hyperlipidemia, unspecified: Secondary | ICD-10-CM

## 2024-01-27 ENCOUNTER — Other Ambulatory Visit: Payer: Self-pay | Admitting: Gastroenterology

## 2024-01-27 ENCOUNTER — Other Ambulatory Visit: Payer: Self-pay | Admitting: Obstetrics and Gynecology

## 2024-01-27 ENCOUNTER — Ambulatory Visit (HOSPITAL_COMMUNITY)
Admission: RE | Admit: 2024-01-27 | Discharge: 2024-01-27 | Disposition: A | Discharge status: Home / Self Care | Source: Ambulatory Visit | Attending: Gastroenterology | Admitting: Gastroenterology

## 2024-01-27 DIAGNOSIS — K862 Cyst of pancreas: Secondary | ICD-10-CM

## 2024-01-27 MED ORDER — GADOBUTROL 1 MMOL/ML IV SOLN
5.0000 mL | Freq: Once | INTRAVENOUS | Status: AC | PRN
Start: 2024-01-27 — End: 2024-01-27
  Administered 2024-01-27: 5 mL via INTRAVENOUS

## 2024-01-29 NOTE — Telephone Encounter (Signed)
 Med refill request: Fosamax   Last AEX: 10/23/22  Next AEX: not scheduled, message has been sent to fd Last MMG (if hormonal med) 10/26/21 Birads cat 1 neg  Refill authorized: last rx 11/14/23 #12 with 0 refills. Please approve or deny

## 2024-01-30 NOTE — Telephone Encounter (Signed)
 Message has been sent to scheduling department to get patient scheduled for her aex per Dr. Newt Barefoot note.

## 2024-02-01 ENCOUNTER — Ambulatory Visit: Payer: Self-pay | Admitting: Gastroenterology

## 2024-02-03 ENCOUNTER — Telehealth: Payer: Self-pay

## 2024-02-03 NOTE — Telephone Encounter (Signed)
 The pt returned call from MRI results. She has been advised and staff message to call pt in 2 years.

## 2024-02-20 ENCOUNTER — Encounter (INDEPENDENT_AMBULATORY_CARE_PROVIDER_SITE_OTHER): Admitting: Family Medicine

## 2024-02-20 NOTE — Progress Notes (Signed)
 error

## 2024-03-17 ENCOUNTER — Ambulatory Visit (INDEPENDENT_AMBULATORY_CARE_PROVIDER_SITE_OTHER): Admitting: Obstetrics and Gynecology

## 2024-03-17 ENCOUNTER — Encounter: Payer: Self-pay | Admitting: Obstetrics and Gynecology

## 2024-03-17 VITALS — BP 124/80 | HR 81 | Wt 99.4 lb

## 2024-03-17 DIAGNOSIS — M810 Age-related osteoporosis without current pathological fracture: Secondary | ICD-10-CM | POA: Diagnosis not present

## 2024-03-17 DIAGNOSIS — Z5181 Encounter for therapeutic drug level monitoring: Secondary | ICD-10-CM | POA: Diagnosis not present

## 2024-03-17 LAB — VITAMIN D 25 HYDROXY (VIT D DEFICIENCY, FRACTURES): Vit D, 25-Hydroxy: 34 ng/mL (ref 30–100)

## 2024-03-17 MED ORDER — ALENDRONATE SODIUM 70 MG PO TABS: 70.0000 mg | ORAL_TABLET | ORAL | 3 refills | Status: AC

## 2024-03-17 NOTE — Patient Instructions (Signed)
 Calcium in Foods Calcium is a mineral in the body. Of all the minerals in your body, you have the most calcium. Most of the body's calcium supply is stored in bones and teeth. Calcium helps many parts of the body work, including: Blood and blood vessels. Nerves. Hormones. Muscles. Bones and teeth. When your calcium stores are low, you may be at risk for low bone mass, bone loss, and broken bones. When you get enough calcium, it helps to support strong bones and teeth throughout your life. Calcium is especially important for: Children during growth spurts. Females during adolescence. Females who are pregnant or breastfeeding. Females after their menstrual cycle stops (postmenopausal). Females whose menstrual cycle has stopped because of an eating disorder or regular intense exercise. People who can't eat or digest dairy products. People who eat a vegan diet. Recommended daily amounts of calcium: Females (ages 77 to 67): 1,000 mg per day. Females (ages 33 and older): 1,200 mg per day. Males (ages 42 to 12): 1,000 mg per day. Males (ages 45 and older): 1,200 mg per day. Females (ages 37 to 70): 1,300 mg per day. Males (ages 76 to 69): 1,300 mg per day. General information Eat foods that are high in calcium. Try to get most of your calcium from food. Some people may benefit from taking calcium supplements. Check with your health care provider or an expert in healthy eating called a dietitian before starting any calcium supplements. Calcium supplements may interact with certain medicines. Too much calcium may cause other health problems, such as trouble pooping and kidney stones. For the body to absorb calcium, it needs vitamin D. Sources of vitamin D include: Skin exposure to direct sunlight. Foods, such as egg yolks, liver, mushrooms, saltwater fish, and fortified milk. Vitamin D supplements. Check with your provider or dietitian before starting any vitamin D supplements. The amount of  calcium that is absorbed in the body varies with type of food. Talk to a dietitian about what foods are best for you, especially if you are eat a vegan diet or don't eat dairy. What foods are high in calcium?  Foods that are high in calcium contain more than 100 milligrams per serving. Fruits Fortified orange juice or other fruit juice, 300 mg per 8 oz (237 mL) serving. Vegetables Collard greens, 260 mg per 1 cup (130 g) serving, cooked. Kale, 180 mg per 1 cup (118 g) serving, cooked. Bok choy, 180 mg per 1 cup (170 g) serving, cooked Grains Fortified frozen waffles, 200 mg in 2 waffles. Oatmeal, 180 mg in 1 cup (234 g) serving, cooked. Fortified white bread, 175 mg per slice. Meats and other proteins Sardines, canned with bones, 350 mg per 3.75 oz (92 g) serving. Salmon, canned with bones, 168 mg per 3 oz (85 g) serving. Canned shrimp, 125 mg per 3 oz (85 g) serving. Baked beans, 120 mg per 1 cup (266 g) serving. Tofu, firm, made with calcium sulfate, 861 mg per  cup (126 g) serving. Dairy Yogurt, plain, low-fat, 448 mg per 1 cup (245 g) serving Nonfat milk, 300 mg per 1 cup (245 g) serving. American cheese, 145 mg per 1 oz (21 g) serving or 1 slice. Cheddar cheese, 200 mg per 1 oz (28 g) serving or 1 slice. Cottage cheese 2%, 125 mg per  cup (113 g) serving. Fortified soy, rice, or almond milk, 300 mg per 1 cup (237 mL) serving. Mozzarella, part skim, 210 mg per 1 oz (21 g) serving. The items listed  above may not be a complete list of foods high in calcium. Actual amounts of calcium may be different depending on processing. Contact a dietitian for more information. What foods are lower in calcium? Foods that are lower in calcium contain 50 mg or less per serving. Fruits Apple, 1 medium, about 6 mg. Banana, 1 medium, about 12 mg. Vegetables Lettuce, 19 mg per 1 cup (35 g) serving. Tomato, 1 small, about 11 mg. Grains Rice, white, 8 mg per  cup (79 g) serving. Boiled  potatoes, 14 mg per 1 cup (160 g) serving. White bread, 6 mg per slice. Meats and other proteins Egg, 24 mg per 1 egg (50 g). Red meat, 7 mg per 4 oz (80 g) serving. Chicken, 17 mg per 4 oz (113 g) serving. Fish, cod, or trout, 20 mg per 4 oz (140 g) serving. Dairy Cream cheese, regular, 14 mg per 1 Tbsp (15 g) serving. Brie cheese, 50 mg per 1 oz (32 g) serving. The items listed above may not be a complete list of foods lower in calcium. Actual amounts of calcium may be different depending on processing. Contact a dietitian for more information. This information is not intended to replace advice given to you by your health care provider. Make sure you discuss any questions you have with your health care provider. Document Revised: 05/04/2023 Document Reviewed: 05/04/2023 Elsevier Patient Education  2024 [employer].

## 2024-03-17 NOTE — Progress Notes (Signed)
 GYNECOLOGY  VISIT   HPI: 68 y.o.   Married  Caucasian female   G2P2 with No LMP recorded. Patient is postmenopausal.   here for: 1 year med check- Fosamax     She is treating osteoporosis with Fosamax  since 2022.   No problems with Fosamax .   Occasionally her toes hurt.    No hx of fractures.  Likes to do gardening and walking.   Taking vit D 2000 international units daily.    Not taking calcium  supplements.  Calcium  10.0, Cr 0.69, and GFR 89.57 08/12/23.   Her BMD is done at [facility] on [address].   07/2022:  T score spine -1.9 (was -2.4), T score of right hip -2.9 (was -3.1)  Brother decreased from pancreatic cancer in July.   GYNECOLOGIC HISTORY: No LMP recorded. Patient is postmenopausal. 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   Atherosclerosis of aorta (HCC) 08/12/2023   Routine general medical examination at a health care facility 08/07/2022   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   Family history of ovarian cancer 05/29/2021   Osteoporosis left hip (-2.7) 07/04/2020   Ovarian cyst, left 10/21/2019   Palpitations 07/14/2018   Elevated CA 19-9 level 04/16/2018   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   Hypertension, essential, benign 04/03/2017   Hyperlipidemia 04/03/2017   FHx: coronary artery disease 04/03/2017    Past Medical History:  Diagnosis Date   Elevated CA 19-9 level    Hyperlipidemia    Hypertension    Kidney stones    Osteoporosis    Pancreatic cyst    Vertigo     Past Surgical History:   Procedure Laterality Date   CARDIAC CATHETERIZATION     COLONOSCOPY     ROBOTIC ASSISTED BILATERAL SALPINGO OOPHERECTOMY Bilateral 08/23/2021   Procedure: XI ROBOTIC ASSISTED BILATERAL SALPINGO OOPHORECTOMY;  Surgeon: Viktoria Comer SAUNDERS, MD;  Location: [facility];  Service: Gynecology;  Laterality: Bilateral;   VULVAR LESION REMOVAL N/A 08/23/2021   Procedure: VAGINAL LESION REMOVAL;  Surgeon: Viktoria Comer SAUNDERS, MD;  Location: [facility];  Service: Gynecology;  Laterality: N/A;    Current Outpatient Medications  Medication Sig Dispense Refill   acetaminophen  (TYLENOL ) 500 MG tablet Take 500 mg by mouth daily as needed for moderate pain.     acyclovir  ointment (ZOVIRAX ) 5 % Apply 1 application topically every 3 (three) hours as needed. 30 g 1   alendronate  (FOSAMAX ) 70 MG tablet TAKE 1 TABLET BY MOUTH ONCE WEEKLY ON AN EMPTY STOMACH BEFORE BREAKFAST. REMAIN UPRIGHT FOR 30 MINUTES AND TAKE WITH 8 OUNCES OF WATER  12 tablet 0   amLODipine  (NORVASC ) 2.5 MG tablet TAKE 1 TABLET BY MOUTH DAILY 90 tablet 3  atorvastatin  (LIPITOR) 10 MG tablet TAKE 1 TABLET BY MOUTH DAILY 90 tablet 3   Cholecalciferol (VITAMIN D3) 2000 units TABS Take 2,000 Units by mouth daily.     ondansetron  (ZOFRAN ) 4 MG tablet Take 1 tablet (4 mg total) by mouth every 8 (eight) hours as needed for nausea or vomiting. 20 tablet 0   No current facility-administered medications for this visit.     ALLERGIES: Aspirin, Codeine, and Other  Family History  Problem Relation Age of Onset   Ovarian cancer Mother    Lymphoma Mother    Pancreatic cancer Father 7       pancreatic   Cancer Sister        ? type   COPD Sister    Heart disease Sister    Mental illness Sister    Heart disease Sister    Pancreatic cancer Brother    Heart disease Brother 76       MI   Heart disease Paternal Grandfather    Stomach cancer Neg Hx    Throat cancer Neg Hx     Social History   Socioeconomic History   Marital status: Married    Spouse  name: Not on file   Number of children: 2   Years of education: Not on file   Highest education level: Some college, no degree  Occupational History   Not on file  Tobacco Use   Smoking status: Former    Current packs/day: 0.00    Types: Cigarettes    Quit date: 04/04/1987    Years since quitting: 36.9   Smokeless tobacco: Never  Vaping Use   Vaping status: Never Used  Substance and Sexual Activity   Alcohol use: Not Currently   Drug use: No   Sexual activity: Not Currently    Partners: Male    Birth control/protection: Post-menopausal    Comment: 1st intercourse 15 yo-1 partner  Other Topics Concern   Not on file  Social History Narrative   Not on file   Social Drivers of Health   Financial Resource Strain: Low Risk  (03/15/2024)   Overall Financial Resource Strain (CARDIA)    Difficulty of Paying Living Expenses: Not hard at all  Food Insecurity: No Food Insecurity (03/15/2024)   Hunger Vital Sign    Worried About Running Out of Food in the Last Year: Never true    Ran Out of Food in the Last Year: Never true  Transportation Needs: No Transportation Needs (03/15/2024)   PRAPARE - Administrator, Civil Service (Medical): No    Lack of Transportation (Non-Medical): No  Physical Activity: Sufficiently Active (03/15/2024)   Exercise Vital Sign    Days of Exercise per Week: 4 days    Minutes of Exercise per Session: 40 min  Stress: No Stress Concern Present (03/15/2024)   [employer] of Occupational Health - Occupational Stress Questionnaire    Feeling of Stress: Only a little  Social Connections: Socially Integrated (03/15/2024)   Social Connection and Isolation Panel    Frequency of Communication with Friends and Family: More than three times a week    Frequency of Social Gatherings with Friends and Family: Once a week    Attends Religious Services: 1 to 4 times per year    Active Member of [employer] or Organizations: Yes    Attends Banker  Meetings: 1 to 4 times per year    Marital Status: Married  Catering manager Violence: Unknown (11/23/2021)   Received  from [facility]   HITS    Physically Hurt: Not on file    Insult or Talk Down To: Not on file    Threaten Physical Harm: Not on file    Scream or Curse: Not on file    Review of Systems  All other systems reviewed and are negative.   PHYSICAL EXAMINATION:   BP 124/80   Pulse 81   Wt 99 lb 6.4 oz (45.1 kg)   SpO2 98%   BMI 18.18 kg/m     General appearance: alert, cooperative and appears stated age  ASSESSMENT:  Osteoporosis of right hip.  On Fosamax  and doing well overall.   PLAN:  BMD reviewed.  Calcium  and vit D discussed.  Check vit D today.  Weight bearing exercise recommended. Refill of Fosamax  70 mg weekly.  #12, RF 3.  We reviewed drug holiday at 5 years of use.   Prolia may be an alternative to Fosamax  if change is needed. BMD at [facility] on [address] in December, 2025.  Breast and pelvic exam in March, 2026.  26 min  total time was spent for this patient encounter, including preparation, face-to-face counseling with the patient, coordination of care, and documentation of the encounter.

## 2024-03-19 ENCOUNTER — Ambulatory Visit: Admitting: Family Medicine

## 2024-03-19 ENCOUNTER — Ambulatory Visit: Payer: Self-pay | Admitting: Obstetrics and Gynecology

## 2024-03-19 DIAGNOSIS — Z0000 Encounter for general adult medical examination without abnormal findings: Secondary | ICD-10-CM | POA: Diagnosis not present

## 2024-03-19 NOTE — Progress Notes (Signed)
 PATIENT CHECK-IN and HEALTH RISK ASSESSMENT QUESTIONNAIRE:  -completed by phone/video for upcoming Medicare Preventive Visit  Pre-Visit Check-in: 1)Vitals (height, wt, BP, etc) - record in vitals section for visit on day of visit Request home vitals (wt, BP, etc.) and enter into vitals, THEN update Vital Signs SmartPhrase below at the top of the HPI. See below.  2)Review and Update Medications, Allergies PMH, Surgeries, Social history in Epic 3)Hospitalizations in the last year with date/reason? n  4)Review and Update Care Team (patient's specialists) in Epic 5) Complete PHQ9 in Epic  6) Complete Fall Screening in Epic 7)Review all Health Maintenance Due and order if not done.  Medicare Wellness Patient Questionnaire:  Answer theses question about your habits: How often do you have a drink containing alcohol?rare How many drinks containing alcohol do you have on a typical day when you are drinking? 1 drink How often do you have six or more drinks on one occasion?never Have you ever smoked?yes Quit date if applicable? 39 years ago  How many packs a day do/did you smoke? 1 ppd Do you use smokeless tobacco?n Do you use an illicit drugs?n On average, how many days per week do you engage in moderate to strenuous exercise (like a brisk walk)?gardens a lot, yard work, when not too hot walks - most days, may start doing kenya chi Typical breakfast: eggs, whole wheat toast, sometimes cherries, sometimes a banana, sometimes a waffle or muffin, fruits Typical lunch: leftovers, avocado and salmon on crackers, [place] hot dogs sometimes, salads Typical dinner: beef once a week, usually chicken or seafood, sometimes pork, always veggies Typical snacks:fruits  Beverages:  decaf spearmint tea  Answer theses question about your everyday activities: Can you perform most household chores?y Are you deaf or have significant trouble hearing?n Do you feel that you have a problem with memory?n Do you feel  safe at home?y Last dentist visit? Usually goes twice a year 8. Do you have any difficulty performing your everyday activities?n Are you having any difficulty walking, taking medications on your own, and or difficulty managing daily home needs?n Do you have difficulty walking or climbing stairs?n Do you have difficulty dressing or bathing?n Do you have difficulty doing errands alone such as visiting a doctor's office or shopping?n Do you currently have any difficulty preparing food and eating?n Do you currently have any difficulty using the toilet?n Do you have any difficulty managing your finances?n Do you have any difficulties with housekeeping of managing your housekeeping?n   Do you have Advanced Directives in place (Living Will, Healthcare Power or Attorney)? n   Last eye Exam and location?Dr. Waylan   Do you currently use prescribed or non-prescribed narcotic or opioid pain medications?n  Do you have a history or close family history of breast, ovarian, tubal or peritoneal cancer or a family member with BRCA (breast cancer susceptibility 1 and 2) gene mutations? 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 Sees gyn for gyn exams.      ----------------------------------------------------------------------------------------------------------------------------------------------------------------------------------------------------------------------  Because this visit was a virtual/telehealth visit, some criteria may be missing or patient reported. Any vitals not documented were not able to be obtained and vitals that have been documented are patient reported.    MEDICARE ANNUAL PREVENTIVE VISIT WITH PROVIDER: (Welcome to Medicare, initial annual wellness or annual wellness exam)  Virtual Visit via Video Note  I connected with Monica Grant on 03/19/24 by a video enabled telemedicine application and verified that I am speaking  with the  correct person using two identifiers.  Location patient: home Location provider:work or home office Persons participating in the virtual visit: patient, provider  Concerns and/or follow up today: no new concerns.    See HM section in Epic for other details of completed HM.    ROS: negative for report of fevers, unintentional weight loss, vision changes, vision loss, hearing loss or change, chest pain, sob, hemoptysis, melena, hematochezia, hematuria, falls, bleeding or bruising, thoughts of suicide or self harm, memory loss  Patient-completed extensive health risk assessment - reviewed and discussed with the patient: See Health Risk Assessment completed with patient prior to the visit either above or in recent phone note. This was reviewed in detailed with the patient today and appropriate recommendations, orders and referrals were placed as needed per Summary below and patient instructions.   Review of Medical History: -PMH, PSH, Family History and current specialty and care providers reviewed and updated and listed below   Patient Care Team: [place], Betty G, MD as PCP - General (Family Medicine) Lonni Slain, MD as PCP - Cardiology (Cardiology) Cathlyn JAYSON Cary, Bobie BRAVO, MD as Consulting Physician (Obstetrics and Gynecology) Legrand Victory LITTIE MOULD, MD as Consulting Physician (Gastroenterology) Waylan Cain, MD as Consulting Physician (Ophthalmology)   Past Medical History:  Diagnosis Date   Elevated CA 19-9 level    Hyperlipidemia    Hypertension    Kidney stones    Osteoporosis    Pancreatic cyst    Vertigo     Past Surgical History:  Procedure Laterality Date   CARDIAC CATHETERIZATION     COLONOSCOPY     ROBOTIC ASSISTED BILATERAL SALPINGO OOPHERECTOMY Bilateral 08/23/2021   Procedure: XI ROBOTIC ASSISTED BILATERAL SALPINGO OOPHORECTOMY;  Surgeon: Viktoria Comer SAUNDERS, MD;  Location: [facility];  Service: Gynecology;  Laterality: Bilateral;   VULVAR LESION REMOVAL N/A  08/23/2021   Procedure: VAGINAL LESION REMOVAL;  Surgeon: Viktoria Comer SAUNDERS, MD;  Location: [facility];  Service: Gynecology;  Laterality: N/A;    Social History   Socioeconomic History   Marital status: Married    Spouse name: Not on file   Number of children: 2   Years of education: Not on file   Highest education level: Some college, no degree  Occupational History   Not on file  Tobacco Use   Smoking status: Former    Current packs/day: 0.00    Types: Cigarettes    Quit date: 04/04/1987    Years since quitting: 36.9   Smokeless tobacco: Never  Vaping Use   Vaping status: Never Used  Substance and Sexual Activity   Alcohol use: Not Currently   Drug use: No   Sexual activity: Not Currently    Partners: Male    Birth control/protection: Post-menopausal    Comment: 1st intercourse 5 yo-1 partner  Other Topics Concern   Not on file  Social History Narrative   Not on file   Social Drivers of Health   Financial Resource Strain: Low Risk  (03/15/2024)   Overall Financial Resource Strain (CARDIA)    Difficulty of Paying Living Expenses: Not hard at all  Food Insecurity: No Food Insecurity (03/15/2024)   Hunger Vital Sign    Worried About Running Out of Food in the Last Year: Never true    Ran Out of Food in the Last Year: Never true  Transportation Needs: No Transportation Needs (03/15/2024)   PRAPARE - Transportation    Lack of Transportation (Medical): No    Lack of  Transportation (Non-Medical): No  Physical Activity: Sufficiently Active (03/15/2024)   Exercise Vital Sign    Days of Exercise per Week: 4 days    Minutes of Exercise per Session: 40 min  Stress: No Stress Concern Present (03/15/2024)   [employer] of Occupational Health - Occupational Stress Questionnaire    Feeling of Stress: Only a little  Social Connections: Socially Integrated (03/15/2024)   Social Connection and Isolation Panel    Frequency of Communication with Friends and Family: More than three  times a week    Frequency of Social Gatherings with Friends and Family: Once a week    Attends Religious Services: 1 to 4 times per year    Active Member of [employer] or Organizations: Yes    Attends Banker Meetings: 1 to 4 times per year    Marital Status: Married  Catering manager Violence: Unknown (11/23/2021)   Received from [facility]   HITS    Physically Hurt: Not on file    Insult or Talk Down To: Not on file    Threaten Physical Harm: Not on file    Scream or Curse: Not on file    Family History  Problem Relation Age of Onset   Ovarian cancer Mother    Lymphoma Mother    Pancreatic cancer Father 42       pancreatic   Cancer Sister        ? type   COPD Sister    Heart disease Sister    Mental illness Sister    Heart disease Sister    Pancreatic cancer Brother    Heart disease Brother 38       MI   Heart disease Paternal Grandfather    Stomach cancer Neg Hx    Throat cancer Neg Hx     Current Outpatient Medications on File Prior to Visit  Medication Sig Dispense Refill   acetaminophen  (TYLENOL ) 500 MG tablet Take 500 mg by mouth daily as needed for moderate pain.     acyclovir  ointment (ZOVIRAX ) 5 % Apply 1 application topically every 3 (three) hours as needed. 30 g 1   alendronate  (FOSAMAX ) 70 MG tablet Take 1 tablet (70 mg total) by mouth once a week. Take with a full glass of water  on an empty stomach. 12 tablet 3   amLODipine  (NORVASC ) 2.5 MG tablet TAKE 1 TABLET BY MOUTH DAILY 90 tablet 3   atorvastatin  (LIPITOR) 10 MG tablet TAKE 1 TABLET BY MOUTH DAILY 90 tablet 3   Cholecalciferol (VITAMIN D3) 2000 units TABS Take 2,000 Units by mouth daily.     ondansetron  (ZOFRAN ) 4 MG tablet Take 1 tablet (4 mg total) by mouth every 8 (eight) hours as needed for nausea or vomiting. 20 tablet 0   No current facility-administered medications on file prior to visit.    Allergies  Allergen Reactions   Aspirin Other (See Comments)    GI upset   Codeine Other  (See Comments)    GI upset   Other     Rash to ChloraPrep       Physical Exam Vitals requested from patient and listed below if patient had equipment and was able to obtain at home for this virtual visit: There were no vitals filed for this visit. Estimated body mass index is 18.18 kg/m as calculated from the following:   Height as of 08/12/23: 5' 2 (1.575 m).   Weight as of 03/17/24: 99 lb 6.4 oz (45.1 kg).  EKG (optional):  deferred due to virtual visit  GENERAL: alert, oriented, no acute distress detected, full vision exam deferred due to pandemic and/or virtual encounter  HEENT: atraumatic, conjunttiva clear, no obvious abnormalities on inspection of external nose and ears  NECK: normal movements of the head and neck  LUNGS: on inspection no signs of respiratory distress, breathing rate appears normal, no obvious gross SOB, gasping or wheezing  CV: no obvious cyanosis  MS: moves all visible extremities without noticeable abnormality  PSYCH/NEURO: pleasant and cooperative, no obvious depression or anxiety, speech and thought processing grossly intact, Cognitive function grossly intact  Flowsheet Row Video Visit from [facility]/20/2022 in [facility]  PHQ-9 Total Score 0        03/19/2024    6:41 PM 08/12/2023    7:56 AM 11/20/2022    4:41 PM 11/20/2022    4:40 PM 08/07/2022    9:54 AM  Depression screen PHQ 2/9  Decreased Interest 0 0 0 0 0  Down, Depressed, Hopeless 0 0 0 0 0  PHQ - 2 Score 0 0 0 0 0  Altered sleeping   0    Tired, decreased energy   0    Change in appetite   0    Feeling bad or failure about yourself    0    Trouble concentrating   0    Moving slowly or fidgety/restless   0    Suicidal thoughts   0    PHQ-9 Score   0    Difficult doing work/chores   Not difficult at all         10/22/2022    6:10 PM 10/23/2022    8:32 AM 11/20/2022    4:40 PM 03/15/2024    9:06 AM 03/19/2024    6:41 PM  Fall Risk  Falls in the past year?  0 0 0 0 0  Was there an injury with Fall?  0 0 0 0  Fall Risk Category Calculator  0 0 0  0  Fall risk Follow up     Falls evaluation completed     Patient-reported     SUMMARY AND PLAN:  Encounter for Medicare annual wellness exam     Discussed applicable health maintenance/preventive health measures and advised and referred or ordered per patient preferences: -reports had mammogram in may with [facility] and agrees to send the report to the office -gets vaccines at pharmacy - discussed fall vaccines due recs/risks Health Maintenance  Topic Date Due   COVID-19 Vaccine (8 - 2024-25 season) 08/16/2023   MAMMOGRAM  10/27/2023   INFLUENZA VACCINE  03/20/2024   Medicare Annual Wellness (AWV)  03/19/2025   DTaP/Tdap/Td (2 - Td or Tdap) 01/15/2028   Colonoscopy  03/14/2029   Pneumococcal Vaccine: 50+ Years  Completed   DEXA SCAN  Completed   Hepatitis C Screening  Completed   Zoster Vaccines- Shingrix  Completed   Hepatitis B Vaccines  Aged Out   HPV VACCINES  Aged Out   Meningococcal B Vaccine  Aged Out      Education and counseling on the following was provided based on the above review of health and a plan/checklist for the patient, along with additional information discussed, was provided for the patient in the patient instructions :  -Advised on importance of completing advanced directives, discussed options for completing and provided information in patient instructions as well -Advised and counseled on a healthy lifestyle - including the importance of a healthy diet, regular  physical activity -Reviewed patient's current diet. Advised and counseled on a whole foods based healthy diet. A summary of a healthy diet was provided in the Patient Instructions.  -reviewed patient's current physical activity level and discussed exercise guidelines for adults. Further resources provided in patient instructions.  -Advise yearly dental visits at minimum and regular eye exams    Follow  up: see patient instructions     Patient Instructions  I really enjoyed getting to talk with you today! I am available on Tuesdays and Thursdays for virtual visits if you have any questions or concerns, or if I can be of any further assistance.   CHECKLIST FROM ANNUAL WELLNESS VISIT:  -Follow up (please call to schedule if not scheduled after visit):   -yearly for annual wellness visit with primary care office  Here is a list of your preventive care/health maintenance measures and the plan for each if any are due:  PLAN For any measures below that may be due:    1. Can get vaccines at the pharmacy. Please let us  know when you do so that we can update your record.    2. Please send us  a copy of your mammogram report. Thank you!  Health Maintenance  Topic Date Due   COVID-19 Vaccine (8 - 2024-25 season) 08/16/2023   MAMMOGRAM  10/27/2023   Medicare Annual Wellness (AWV)  11/20/2023   INFLUENZA VACCINE  03/20/2024   DTaP/Tdap/Td (2 - Td or Tdap) 01/15/2028   Colonoscopy  03/14/2029   Pneumococcal Vaccine: 50+ Years  Completed   DEXA SCAN  Completed   Hepatitis C Screening  Completed   Zoster Vaccines- Shingrix  Completed   Hepatitis B Vaccines  Aged Out   HPV VACCINES  Aged Out   Meningococcal B Vaccine  Aged Out    -See a dentist at least yearly  -Get your eyes checked and then per your eye specialist's recommendations  -Other issues addressed today:   -I have included below further information regarding a healthy whole foods based diet, physical activity guidelines for adults, stress management and opportunities for social connections. I hope you find this information useful.   -----------------------------------------------------------------------------------------------------------------------------------------------------------------------------------------------------------------------------------------------------------    NUTRITION: -eat real food: lots of  colorful vegetables (half the plate) and fruits -5-7 servings of vegetables and fruits per day (fresh or steamed is best), exp. 2 servings of vegetables with lunch and dinner and 2 servings of fruit per day. Berries and greens such as kale and collards are great choices.  -consume on a regular basis:  fresh fruits, fresh veggies, fish, nuts, seeds, healthy oils (such as olive oil, avocado oil), whole grains (make sure for bread/pasta/crackers/etc., that the first ingredient on label contains the word whole), legumes. -can eat small amounts of dairy and lean meat (no larger than the palm of your hand), but avoid processed meats such as ham, bacon, lunch meat, etc. -drink water  -try to avoid fast food and pre-packaged foods, processed meat, ultra processed foods/beverages (donuts, candy, etc.) -most experts advise limiting sodium to < 2300mg  per day, should limit further is any chronic conditions such as high blood pressure, heart disease, diabetes, etc. The American Heart Association advised that < 1500mg  is is ideal -try to avoid foods/beverages that contain any ingredients with names you do not recognize  -try to avoid foods/beverages  with added sugar or sweeteners/sweets  -try to avoid sweet drinks (including diet drinks): soda, juice, Gatorade, sweet tea, power drinks, diet drinks -try to avoid white rice, white bread,  pasta (unless whole grain)  EXERCISE GUIDELINES FOR ADULTS: -if you wish to increase your physical activity, do so gradually and with the approval of your doctor -STOP and seek medical care immediately if you have any chest pain, chest discomfort or trouble breathing when starting or increasing exercise  -move and stretch your body, legs, feet and arms when sitting for long periods -Physical activity guidelines for optimal health in adults: -get at least 150 minutes per week of moderate exercise (can talk, but not sing); this is about 20-30 minutes of sustained activity 5-7 days  per week or two 10-15 minute episodes of sustained activity 5-7 days per week -do some muscle building/resistance training/strength training at least 2 days per week  -balance exercises 3+ days per week:   Stand somewhere where you have something sturdy to hold onto if you lose balance    1) lift up on toes, then back down, start with 5x per day and work up to 20x   2) stand and lift one leg straight out to the side so that foot is a few inches of the floor, start with 5x each side and work up to 20x each side   3) stand on one foot, start with 5 seconds each side and work up to 20 seconds on each side  If you need ideas or help with getting more active:  -Silver sneakers https://tools.silversneakers.com  -Walk with a Doc: http://www.duncan-williams.com/  -try to include resistance (weight lifting/strength building) and balance exercises twice per week: or the following link for ideas: http://castillo-powell.com/  BuyDucts.dk  STRESS MANAGEMENT: -can try meditating, or just sitting quietly with deep breathing while intentionally relaxing all parts of your body for 5 minutes daily -if you need further help with stress, anxiety or depression please follow up with your primary doctor or contact the wonderful folks at [facility]: [phone]  SOCIAL CONNECTIONS: -options in [place] if you wish to engage in more social and exercise related activities:  -Silver sneakers https://tools.silversneakers.com  -Walk with a Doc: http://www.duncan-williams.com/  -Check out the [facility] Active Adults 50+ section on the [place] of [employer] (hiking clubs, book clubs, cards and games, chess, exercise classes, aquatic classes and much more) - see the website for details: https://www.[place].gov/departments/parks-recreation/active-adults50  -YouTube has lots of exercise videos for different  ages and abilities as well  -[facility] (a variety of indoor and outdoor inperson activities for adults). 2198070127. [address]  -Virtual Online Classes (a variety of topics): see seniorplanet.org or call [phone]  -consider volunteering at a school, hospice center, church, senior center or elsewhere    ADVANCED HEALTHCARE DIRECTIVES:  [place] Advanced Directives assistance:   ExpressWeek.com.cy  Everyone should have advanced health care directives in place. This is so that you get the care you want, should you ever be in a situation where you are unable to make your own medical decisions.   From the [place] Advanced Directive Website: Advance Health Care Directives are legal documents in which you give written instructions about your health care if, in the future, you cannot speak for yourself.   A health care power of attorney allows you to name a person you trust to make your health care decisions if you cannot make them yourself. A declaration of a desire for a natural death (or living will) is document, which states that you desire not to have your life prolonged by extraordinary measures if you have a terminal or incurable illness or if you are in  a vegetative state. An advance instruction for mental health treatment makes a declaration of instructions, information and preferences regarding your mental health treatment. It also states that you are aware that the advance instruction authorizes a mental health treatment provider to act according to your wishes. It may also outline your consent or refusal of mental health treatment. A declaration of an anatomical gift allows anyone over the age of 19 to make a gift by will, organ donor card or other document.   Please see the following website or an elder law attorney for forms, FAQs and for completion of advanced directives: [place]  Nurse, learning disability Health Care Directives Advance Health Care Directives (http://guzman.com/)  Or copy and paste the following to your web browser: [email or web address]          Chiquita JONELLE Cramp, DO

## 2024-03-19 NOTE — Patient Instructions (Addendum)
 I really enjoyed getting to talk with you today! I am available on Tuesdays and Thursdays for virtual visits if you have any questions or concerns, or if I can be of any further assistance.   CHECKLIST FROM ANNUAL WELLNESS VISIT:  -Follow up (please call to schedule if not scheduled after visit):   -yearly for annual wellness visit with primary care office  Here is a list of your preventive care/health maintenance measures and the plan for each if any are due:  PLAN For any measures below that may be due:    1. Can get vaccines at the pharmacy. Please let us  know when you do so that we can update your record.    2. Please send us  a copy of your mammogram report. Thank you!  Health Maintenance  Topic Date Due   COVID-19 Vaccine (8 - 2024-25 season) 08/16/2023   MAMMOGRAM  10/27/2023   Medicare Annual Wellness (AWV)  11/20/2023   INFLUENZA VACCINE  03/20/2024   DTaP/Tdap/Td (2 - Td or Tdap) 01/15/2028   Colonoscopy  03/14/2029   Pneumococcal Vaccine: 50+ Years  Completed   DEXA SCAN  Completed   Hepatitis C Screening  Completed   Zoster Vaccines- Shingrix  Completed   Hepatitis B Vaccines  Aged Out   HPV VACCINES  Aged Out   Meningococcal B Vaccine  Aged Out    -See a dentist at least yearly  -Get your eyes checked and then per your eye specialist's recommendations  -Other issues addressed today:   -I have included below further information regarding a healthy whole foods based diet, physical activity guidelines for adults, stress management and opportunities for social connections. I hope you find this information useful.   -----------------------------------------------------------------------------------------------------------------------------------------------------------------------------------------------------------------------------------------------------------    NUTRITION: -eat real food: lots of colorful vegetables (half the plate) and fruits -5-7 servings  of vegetables and fruits per day (fresh or steamed is best), exp. 2 servings of vegetables with lunch and dinner and 2 servings of fruit per day. Berries and greens such as kale and collards are great choices.  -consume on a regular basis:  fresh fruits, fresh veggies, fish, nuts, seeds, healthy oils (such as olive oil, avocado oil), whole grains (make sure for bread/pasta/crackers/etc., that the first ingredient on label contains the word whole), legumes. -can eat small amounts of dairy and lean meat (no larger than the palm of your hand), but avoid processed meats such as ham, bacon, lunch meat, etc. -drink water  -try to avoid fast food and pre-packaged foods, processed meat, ultra processed foods/beverages (donuts, candy, etc.) -most experts advise limiting sodium to < 2300mg  per day, should limit further is any chronic conditions such as high blood pressure, heart disease, diabetes, etc. The American Heart Association advised that < 1500mg  is is ideal -try to avoid foods/beverages that contain any ingredients with names you do not recognize  -try to avoid foods/beverages  with added sugar or sweeteners/sweets  -try to avoid sweet drinks (including diet drinks): soda, juice, Gatorade, sweet tea, power drinks, diet drinks -try to avoid white rice, white bread, pasta (unless whole grain)  EXERCISE GUIDELINES FOR ADULTS: -if you wish to increase your physical activity, do so gradually and with the approval of your doctor -STOP and seek medical care immediately if you have any chest pain, chest discomfort or trouble breathing when starting or increasing exercise  -move and stretch your body, legs, feet and arms when sitting for long periods -Physical activity guidelines for optimal health in adults: -get at  least 150 minutes per week of moderate exercise (can talk, but not sing); this is about 20-30 minutes of sustained activity 5-7 days per week or two 10-15 minute episodes of sustained activity  5-7 days per week -do some muscle building/resistance training/strength training at least 2 days per week  -balance exercises 3+ days per week:   Stand somewhere where you have something sturdy to hold onto if you lose balance    1) lift up on toes, then back down, start with 5x per day and work up to 20x   2) stand and lift one leg straight out to the side so that foot is a few inches of the floor, start with 5x each side and work up to 20x each side   3) stand on one foot, start with 5 seconds each side and work up to 20 seconds on each side  If you need ideas or help with getting more active:  -Silver sneakers https://tools.silversneakers.com  -Walk with a Doc: http://www.duncan-williams.com/  -try to include resistance (weight lifting/strength building) and balance exercises twice per week: or the following link for ideas: http://castillo-powell.com/  BuyDucts.dk  STRESS MANAGEMENT: -can try meditating, or just sitting quietly with deep breathing while intentionally relaxing all parts of your body for 5 minutes daily -if you need further help with stress, anxiety or depression please follow up with your primary doctor or contact the wonderful folks at [facility]: [phone]  SOCIAL CONNECTIONS: -options in [place] if you wish to engage in more social and exercise related activities:  -Silver sneakers https://tools.silversneakers.com  -Walk with a Doc: http://www.duncan-williams.com/  -Check out the [facility] Active Adults 50+ section on the [place] of [employer] (hiking clubs, book clubs, cards and games, chess, exercise classes, aquatic classes and much more) - see the website for details: https://www.[place].gov/departments/parks-recreation/active-adults50  -YouTube has lots of exercise videos for different ages and abilities as well  -[facility] (a  variety of indoor and outdoor inperson activities for adults). [phone]. [address]  -Virtual Online Classes (a variety of topics): see seniorplanet.org or call 9317744382  -consider volunteering at a school, hospice center, church, senior center or elsewhere    ADVANCED HEALTHCARE DIRECTIVES:  [place] Advanced Directives assistance:   ExpressWeek.com.cy  Everyone should have advanced health care directives in place. This is so that you get the care you want, should you ever be in a situation where you are unable to make your own medical decisions.   From the [place] Advanced Directive Website: Advance Health Care Directives are legal documents in which you give written instructions about your health care if, in the future, you cannot speak for yourself.   A health care power of attorney allows you to name a person you trust to make your health care decisions if you cannot make them yourself. A declaration of a desire for a natural death (or living will) is document, which states that you desire not to have your life prolonged by extraordinary measures if you have a terminal or incurable illness or if you are in a vegetative state. An advance instruction for mental health treatment makes a declaration of instructions, information and preferences regarding your mental health treatment. It also states that you are aware that the advance instruction authorizes a mental health treatment provider to act according to your wishes. It may also outline your consent or refusal of mental health treatment. A declaration of an anatomical gift allows anyone over the age of 55 to make a  gift by will, organ donor card or other document.   Please see the following website or an elder law attorney for forms, FAQs and for completion of advanced directives: [place]  Print production planner Health Care Directives Advance Health Care  Directives ([email or web address]/)  Or copy and paste the following to your web browser: [email or web address]

## 2024-05-11 ENCOUNTER — Encounter: Payer: Self-pay | Admitting: Obstetrics and Gynecology

## 2024-07-12 NOTE — Progress Notes (Unsigned)
 Cardiology Office Note:    Date:  07/13/2024   ID:  Monica Grant, DOB 12-Mar-1956, MRN [id number]  PCP:  Jordan, Betty G, MD   [facility] Providers Cardiologist:  Shelda Bruckner, MD     Referring MD: Jordan, Betty G, MD   Chief complaint: Annual follow-up of HTN/HLD     History of Present Illness:   Monica Grant is a 68 y.o. female with a hx of hypertension, hyperlipidemia, initially referred to cardiology secondary to palpitations presents today for annual follow-up of her chronic cardiovascular conditions.  Was initially referred to cardiology in September 2019 for the evaluation and management of palpitations.  Cardiac event monitor at that time showed approximately 140 patient triggered events recorded during this time. Patient symptoms included skipped beats, heart racing, shortness of breath, and chest pain. All episodes were sinus rhythm. Events were predominantly sinus rhythm, with occasional sinus rhythm with single PAC, single PVC, or rare couplets. The longest run of paroxysmal SVT was 8 beats at 119 bpm. No nonsustained VT seen. No pauses, sustained arrhythmias, or high degree AV block documented.  Patient reported she felt better at the June 2020 visit following the elimination of caffeine from her diet.  At the 2023 visit patient reported precordial pain with mild ST depressions in inferior lateral leads, patient was transported to the ED.  Workup in the ED included unremarkable BMP, CBC, negative troponins, negative CXR, referred back to cardiology outpatient for potential cardiac stress test.  Chest pain did not recur following her ED visit, further testing was not pursued following cessation of symptoms.  Presents independently, doing well, no complaints.  Enjoys gardening.  Reports occasional lightheadedness when going from a crouched position in her garden to standing quickly, does not happen often, no near-syncope, resolves quickly.  Reports BPs at home  range from 110-130 systolic/70s diastolic.  Walks 2 miles 5X per week.  ROS:   Please see the history of present illness.    All other systems reviewed and are negative.     Past Medical History:  Diagnosis Date   Allergy ???   Aspirin & Codeine make me sick   Elevated CA 19-9 level    Hyperlipidemia    Hypertension    Kidney stones    Osteoporosis    Pancreatic cyst    Vertigo     Past Surgical History:  Procedure Laterality Date   CARDIAC CATHETERIZATION     COLONOSCOPY     ROBOTIC ASSISTED BILATERAL SALPINGO OOPHERECTOMY Bilateral 08/23/2021   Procedure: XI ROBOTIC ASSISTED BILATERAL SALPINGO OOPHORECTOMY;  Surgeon: Viktoria Comer SAUNDERS, MD;  Location: [facility];  Service: Gynecology;  Laterality: Bilateral;   VULVAR LESION REMOVAL N/A 08/23/2021   Procedure: VAGINAL LESION REMOVAL;  Surgeon: Viktoria Comer SAUNDERS, MD;  Location: [facility];  Service: Gynecology;  Laterality: N/A;    Current Medications: Current Meds  Medication Sig   acetaminophen  (TYLENOL ) 500 MG tablet Take 500 mg by mouth daily as needed for moderate pain.   acyclovir  ointment (ZOVIRAX ) 5 % Apply 1 application topically every 3 (three) hours as needed.   alendronate  (FOSAMAX ) 70 MG tablet Take 1 tablet (70 mg total) by mouth once a week. Take with a full glass of water  on an empty stomach.   amLODipine  (NORVASC ) 2.5 MG tablet TAKE 1 TABLET BY MOUTH DAILY   atorvastatin  (LIPITOR) 10 MG tablet TAKE 1 TABLET BY MOUTH DAILY   Cholecalciferol (VITAMIN D3) 2000 units TABS Take 2,000 Units by mouth daily.  ondansetron  (ZOFRAN ) 4 MG tablet Take 1 tablet (4 mg total) by mouth every 8 (eight) hours as needed for nausea or vomiting.     Allergies:   Aspirin, Codeine, and Other   Social History   Socioeconomic History   Marital status: Married    Spouse name: Not on file   Number of children: 2   Years of education: Not on file   Highest education level: Some college, no degree  Occupational History   Not on file   Tobacco Use   Smoking status: Former    Current packs/day: 0.00    Types: Cigarettes    Quit date: 04/04/1987    Years since quitting: 37.3    Passive exposure: Past   Smokeless tobacco: Never  Vaping Use   Vaping status: Never Used  Substance and Sexual Activity   Alcohol use: Not Currently   Drug use: No   Sexual activity: Not Currently    Partners: Male    Birth control/protection: Post-menopausal    Comment: 1st intercourse 54 yo-1 partner  Other Topics Concern   Not on file  Social History Narrative   Not on file   Social Drivers of Health   Financial Resource Strain: Low Risk  (03/15/2024)   Overall Financial Resource Strain (CARDIA)    Difficulty of Paying Living Expenses: Not hard at all  Food Insecurity: No Food Insecurity (07/13/2024)   Hunger Vital Sign    Worried About Running Out of Food in the Last Year: Never true    Ran Out of Food in the Last Year: Never true  Transportation Needs: No Transportation Needs (03/15/2024)   PRAPARE - Administrator, Civil Service (Medical): No    Lack of Transportation (Non-Medical): No  Physical Activity: Sufficiently Active (03/15/2024)   Exercise Vital Sign    Days of Exercise per Week: 4 days    Minutes of Exercise per Session: 40 min  Stress: No Stress Concern Present (03/15/2024)   [employer] of Occupational Health - Occupational Stress Questionnaire    Feeling of Stress: Only a little  Social Connections: Socially Integrated (03/15/2024)   Social Connection and Isolation Panel    Frequency of Communication with Friends and Family: More than three times a week    Frequency of Social Gatherings with Friends and Family: Once a week    Attends Religious Services: 1 to 4 times per year    Active Member of [employer] or Organizations: Yes    Attends Banker Meetings: 1 to 4 times per year    Marital Status: Married     Family History: The patient's family history includes COPD in her sister and  sister; Cancer in her brother, father, mother, and sister; Heart disease in her paternal grandfather, sister, and sister; Heart disease (age of onset: 91) in her brother; Lymphoma in her mother; Mental illness in her sister; Ovarian cancer in her mother; Pancreatic cancer in her brother; Pancreatic cancer (age of onset: 84) in her father. There is no history of Stomach cancer or Throat cancer.  EKGs/Labs/Other Studies Reviewed:    The following studies were reviewed today:  EKG Interpretation Date/Time:  Monday July 13 2024 15:08:19 EST Ventricular Rate:  70 PR Interval:  146 QRS Duration:  106 QT Interval:  398 QTC Calculation: 429 R Axis:   85  Text Interpretation: Normal sinus rhythm Possible Left atrial enlargement Incomplete right bundle branch block When compared with ECG of 28-Jun-2023 09:05, No significant change was  found Confirmed by Nathali Vent [phone]) on 07/13/2024 3:38:12 PM    Recent Labs: 08/12/2023: ALT 12; BUN 12; Creatinine, Ser 0.69; Hemoglobin 14.9; Platelets 270.0; Potassium 3.5; Sodium 142; TSH 1.33  Recent Lipid Panel    Component Value Date/Time   CHOL 167 08/12/2023 0829   TRIG 59.0 08/12/2023 0829   HDL 75.90 08/12/2023 0829   CHOLHDL 2 08/12/2023 0829   VLDL 11.8 08/12/2023 0829   LDLCALC 79 08/12/2023 0829   LDLCALC 89 07/05/2020 0814    Physical Exam:    VS:  BP (!) 140/80 (BP Location: Right Arm, Patient Position: Sitting, Cuff Size: Normal)   Pulse 70   Ht 5' 2 (1.575 m)   Wt 105 lb 11.2 oz (47.9 kg)   SpO2 99%   BMI 19.33 kg/m        Wt Readings from Last 3 Encounters:  07/13/24 105 lb 11.2 oz (47.9 kg)  03/17/24 99 lb 6.4 oz (45.1 kg)  08/12/23 106 lb (48.1 kg)     GEN:  Well nourished, well developed in no acute distress HEENT: Normal NECK:  No carotid bruits CARDIAC:  S1-S2 normal, RRR, no murmurs, rubs, gallops RESPIRATORY:  Clear to auscultation without rales, wheezing or rhonchi  MUSCULOSKELETAL:  No edema; No  deformity  SKIN: Warm and dry NEUROLOGIC:  Alert and oriented x 3 PSYCHIATRIC:  Normal affect       Assessment & Plan Hypertension, essential, benign BPs reported well-controlled at home Typically ranging from 110-130 systolic/70s diastolic. Continue amlodipine  2.5 mg daily Hyperlipidemia, unspecified hyperlipidemia type Lipid panel 08/12/2023: Cholesterol 167, LDL 79, HDL 75, triglycerides 59 Is due for fasting labs/lipids at her physical scheduled for December with PCP Continue atorvastatin  10 mg daily Palpitations EKG: 70 bpm, NSR, incomplete RBBB, possible LA enlargement, no significant change since prior study Historical palpitations past None recently/regularly Continue to monitor for recurrence  May follow-up in one year with Dr. Lonni or sooner if symptoms change          Medication Adjustments/Labs and Tests Ordered: Current medicines are reviewed at length with the patient today.  Concerns regarding medicines are outlined above.  Orders Placed This Encounter  Procedures   EKG 12-Lead   No orders of the defined types were placed in this encounter.   Patient Instructions  Medication Instructions:  Continue your current medications.   *If you need a refill on your cardiac medications before your next appointment, please call your pharmacy*  Follow-Up: At [facility], you and your health needs are our priority.  As part of our continuing mission to provide you with exceptional heart care, our providers are all part of one team.  This team includes your primary Cardiologist (physician) and Advanced Practice Providers or APPs (Physician Assistants and Nurse Practitioners) who all work together to provide you with the care you need, when you need it.  Your next appointment:   1 year(s)  Provider:   Shelda Lonni, MD, Rosaline Bane, NP, or Reche Finder, NP    We recommend signing up for the patient portal called MyChart.  Sign up  information is provided on this After Visit Summary.  MyChart is used to connect with patients for Virtual Visits (Telemedicine).  Patients are able to view lab/test results, encounter notes, upcoming appointments, etc.  Non-urgent messages can be sent to your provider as well.   To learn more about what you can do with MyChart, go to [email or web address].   Other Instructions  Follow up with  primary care as scheduled in December. Be sure to fast as they will be checking your labwork!           Signed, Miriam FORBES Shams, NP  07/13/2024 4:21 PM    [facility]

## 2024-07-13 ENCOUNTER — Ambulatory Visit (INDEPENDENT_AMBULATORY_CARE_PROVIDER_SITE_OTHER): Admitting: Emergency Medicine

## 2024-07-13 ENCOUNTER — Encounter (HOSPITAL_BASED_OUTPATIENT_CLINIC_OR_DEPARTMENT_OTHER): Payer: Self-pay | Admitting: Emergency Medicine

## 2024-07-13 VITALS — BP 140/80 | HR 70 | Ht 62.0 in | Wt 105.7 lb

## 2024-07-13 DIAGNOSIS — E785 Hyperlipidemia, unspecified: Secondary | ICD-10-CM | POA: Diagnosis not present

## 2024-07-13 DIAGNOSIS — I10 Essential (primary) hypertension: Secondary | ICD-10-CM

## 2024-07-13 DIAGNOSIS — R002 Palpitations: Secondary | ICD-10-CM | POA: Diagnosis not present

## 2024-07-13 NOTE — Patient Instructions (Signed)
 Medication Instructions:  Continue your current medications.   *If you need a refill on your cardiac medications before your next appointment, please call your pharmacy*  Follow-Up: At [facility], you and your health needs are our priority.  As part of our continuing mission to provide you with exceptional heart care, our providers are all part of one team.  This team includes your primary Cardiologist (physician) and Advanced Practice Providers or APPs (Physician Assistants and Nurse Practitioners) who all work together to provide you with the care you need, when you need it.  Your next appointment:   1 year(s)  Provider:   Shelda Bruckner, MD, Rosaline Bane, NP, or Reche Finder, NP    We recommend signing up for the patient portal called MyChart.  Sign up information is provided on this After Visit Summary.  MyChart is used to connect with patients for Virtual Visits (Telemedicine).  Patients are able to view lab/test results, encounter notes, upcoming appointments, etc.  Non-urgent messages can be sent to your provider as well.   To learn more about what you can do with MyChart, go to [email or web address].   Other Instructions  Follow up with primary care as scheduled in December. Be sure to fast as they will be checking your labwork!

## 2024-07-13 NOTE — Assessment & Plan Note (Signed)
 BPs reported well-controlled at home Typically ranging from 110-130 systolic/70s diastolic. Continue amlodipine  2.5 mg daily

## 2024-07-13 NOTE — Assessment & Plan Note (Signed)
 Lipid panel 08/12/2023: Cholesterol 167, LDL 79, HDL 75, triglycerides 59 Is due for fasting labs/lipids at her physical scheduled for December with PCP Continue atorvastatin  10 mg daily

## 2024-07-13 NOTE — Assessment & Plan Note (Signed)
 EKG: 70 bpm, NSR, incomplete RBBB, possible LA enlargement, no significant change since prior study Historical palpitations past None recently/regularly Continue to monitor for recurrence

## 2024-08-11 LAB — HM DEXA SCAN

## 2024-08-12 ENCOUNTER — Ambulatory Visit (INDEPENDENT_AMBULATORY_CARE_PROVIDER_SITE_OTHER): Admitting: Family Medicine

## 2024-08-12 ENCOUNTER — Encounter: Payer: Self-pay | Admitting: Family Medicine

## 2024-08-12 VITALS — BP 140/85 | HR 75 | Temp 97.7°F | Ht 62.0 in | Wt 104.4 lb

## 2024-08-12 DIAGNOSIS — E785 Hyperlipidemia, unspecified: Secondary | ICD-10-CM

## 2024-08-12 DIAGNOSIS — I10 Essential (primary) hypertension: Secondary | ICD-10-CM

## 2024-08-12 NOTE — Assessment & Plan Note (Signed)
 BP today mildly elevated, improved after a few minutes. BP readings at home 120s/70s. Continue amlodipine  2.5 mg daily and low-salt diet. Continue monitoring BP at home regularly. As far as problem is a stable, we will continue annual follow-ups. She also follows with cardiologist.

## 2024-08-12 NOTE — Progress Notes (Unsigned)
 "  Chief Complaint  Patient presents with   Medical Management of Chronic Issues    Patient is here for a follow-up appointment and for lab work.   Discussed the use of AI scribe software for clinical note transcription with the patient, who gave verbal consent to proceed.  History of Present Illness Monica Grant is a 68 year old female with a PMHx significant for pancreatic cyst, HLD, HTN, and osteoporosis here today for a follow-up visit. She was last seen on 08/12/2023. Since her last visit she has follow-up with gastroenterologist, cardiologist, and gynecologist. No new health problems.  She recently had a bone density test, but the results are not yet available.  She has received her flu and COVID vaccinations.   Hypertension: Her blood pressure at home typically ranges from 117/70 to 120/75. She occasionally experiences lightheadedness when standing up too quickly, particularly when working outside, but manages this by rising more slowly. She is currently taking amlodipine  2.5 mg daily. Negative for unusual or severe headache, visual changes, exertional chest pain, dyspnea,  focal weakness, or edema.  Lab Results  Component Value Date   NA 142 08/12/2023   CL 103 08/12/2023   K 3.5 08/12/2023   CO2 31 08/12/2023   BUN 12 08/12/2023   CREATININE 0.69 08/12/2023   GFR 89.57 08/12/2023   CALCIUM  10.0 08/12/2023   ALBUMIN 4.6 08/12/2023   GLUCOSE 87 08/12/2023   Hyperlipidemia: Currently on atorvastatin  10 mg daily. Her diet includes fish, white meat, beef, nuts, and shrimp, and she tries to avoid saturated fats and sugar. She consumes almond milk for its higher protein content compared to cow's milk. She occasionally eats dark chocolate and apples to manage sugar cravings. Lab Results  Component Value Date   CHOL 167 08/12/2023   HDL 75.90 08/12/2023   LDLCALC 79 08/12/2023   TRIG 59.0 08/12/2023   CHOLHDL 2 08/12/2023   Review of Systems  Constitutional:  Negative  for activity change, appetite change, chills and fever.  HENT:  Negative for sore throat.   Respiratory:  Negative for cough and wheezing.   Gastrointestinal:  Negative for abdominal pain, nausea and vomiting.  Genitourinary:  Negative for decreased urine volume, dysuria and hematuria.  Skin:  Negative for rash.  Neurological:  Negative for syncope and facial asymmetry.  See other pertinent positives and negatives in HPI.  Medications Ordered Prior to Encounter[1]  Past Medical History:  Diagnosis Date   Allergy ???   Aspirin & Codeine make me sick   Elevated CA 19-9 level    Hyperlipidemia    Hypertension    Kidney stones    Osteoporosis    Pancreatic cyst    Vertigo    Allergies[2]  Social History   Socioeconomic History   Marital status: Married    Spouse name: Not on file   Number of children: 2   Years of education: Not on file   Highest education level: Some college, no degree  Occupational History   Not on file  Tobacco Use   Smoking status: Former    Current packs/day: 0.00    Types: Cigarettes    Quit date: 04/04/1987    Years since quitting: 37.3    Passive exposure: Past   Smokeless tobacco: Never  Vaping Use   Vaping status: Never Used  Substance and Sexual Activity   Alcohol use: Not Currently   Drug use: No   Sexual activity: Not Currently    Partners: Male  Birth control/protection: Post-menopausal    Comment: 1st intercourse 68 yo-1 partner  Other Topics Concern   Not on file  Social History Narrative   Not on file   Social Drivers of Health   Tobacco Use: Medium Risk (08/12/2024)   Patient History    Smoking Tobacco Use: Former    Smokeless Tobacco Use: Never    Passive Exposure: Past  Physicist, Medical Strain: Low Risk (08/08/2024)   Overall Financial Resource Strain (CARDIA)    Difficulty of Paying Living Expenses: Not hard at all  Food Insecurity: No Food Insecurity (08/08/2024)   Epic    Worried About Radiation Protection Practitioner of Food in  the Last Year: Never true    Ran Out of Food in the Last Year: Never true  Transportation Needs: No Transportation Needs (08/08/2024)   Epic    Lack of Transportation (Medical): No    Lack of Transportation (Non-Medical): No  Physical Activity: Sufficiently Active (08/08/2024)   Exercise Vital Sign    Days of Exercise per Week: 4 days    Minutes of Exercise per Session: 40 min  Stress: No Stress Concern Present (08/08/2024)   [employer] of Occupational Health - Occupational Stress Questionnaire    Feeling of Stress: Only a little  Social Connections: Moderately Integrated (08/08/2024)   Social Connection and Isolation Panel    Frequency of Communication with Friends and Family: Twice a week    Frequency of Social Gatherings with Friends and Family: Once a week    Attends Religious Services: 1 to 4 times per year    Active Member of [employer] or Organizations: No    Attends Banker Meetings: Not on file    Marital Status: Married  Depression (PHQ2-9): Low Risk (03/19/2024)   Depression (PHQ2-9)    PHQ-2 Score: 0  Alcohol Screen: Low Risk (08/08/2024)   Alcohol Screen    Last Alcohol Screening Score (AUDIT): 1  Housing: Low Risk (08/08/2024)   Epic    Unable to Pay for Housing in the Last Year: No    Number of Times Moved in the Last Year: 0    Homeless in the Last Year: No  Utilities: Not on file  Health Literacy: Not on file   Today's Vitals   08/12/24 0821 08/12/24 0859  BP: (!) 142/80 (!) 140/85  Pulse: 75   Temp: 97.7 F (36.5 C)   TempSrc: Oral   SpO2: 99%   Weight: 104 lb 6.4 oz (47.4 kg)   Height: 5' 2 (1.575 m)    Wt Readings from Last 3 Encounters:  08/12/24 104 lb 6.4 oz (47.4 kg)  07/13/24 105 lb 11.2 oz (47.9 kg)  03/17/24 99 lb 6.4 oz (45.1 kg)   Body mass index is 19.1 kg/m.  Physical Exam Vitals and nursing note reviewed.  Constitutional:      General: She is not in acute distress.    Appearance: She is well-developed.  HENT:      Head: Normocephalic and atraumatic.  Eyes:     Conjunctiva/sclera: Conjunctivae normal.  Cardiovascular:     Rate and Rhythm: Normal rate and regular rhythm.     Pulses:          Posterior tibial pulses are 2+ on the right side and 2+ on the left side.     Heart sounds: No murmur heard. Pulmonary:     Effort: Pulmonary effort is normal. No respiratory distress.     Breath sounds: Normal breath sounds.  Abdominal:  Palpations: Abdomen is soft. There is no hepatomegaly or mass.     Tenderness: There is no abdominal tenderness.  Musculoskeletal:     Right lower leg: No edema.     Left lower leg: No edema.  Skin:    General: Skin is warm.     Findings: No erythema or rash.  Neurological:     General: No focal deficit present.     Mental Status: She is alert and oriented to person, place, and time.     Cranial Nerves: No cranial nerve deficit.     Gait: Gait normal.  Psychiatric:        Mood and Affect: Mood and affect normal.   ASSESSMENT AND PLAN:  [name] was seen today for medical management of chronic issues.  Diagnoses and all orders for this visit:  Orders Placed This Encounter  Procedures   Comprehensive metabolic panel with GFR   Lipid panel  *** Hyperlipidemia, unspecified hyperlipidemia type Assessment & Plan: Last LDL 79 in 07/2023. Currently on lovastatin 10 mg daily and low-fat diet, no changes today. Further recommendation will be given according to lipid panel result.  Orders: -     Comprehensive metabolic panel with GFR; Future -     Lipid panel; Future  Hypertension, essential, benign Assessment & Plan: BP today mildly elevated, improved after a few minutes. BP readings at home 120s/70s. Continue amlodipine  2.5 mg daily and low-salt diet. Continue monitoring BP at home regularly. As far as problem is a stable, we will continue annual follow-ups. She also follows with cardiologist.  Orders: -     Comprehensive metabolic panel with  GFR; Future  Return in about 1 year (around 08/12/2025) for chronic problems.   Rolla Servidio, MD [facility]. [place] office.      [1]  Current Outpatient Medications on File Prior to Visit  Medication Sig Dispense Refill   acetaminophen  (TYLENOL ) 500 MG tablet Take 500 mg by mouth daily as needed for moderate pain.     acyclovir  ointment (ZOVIRAX ) 5 % Apply 1 application topically every 3 (three) hours as needed. 30 g 1   alendronate  (FOSAMAX ) 70 MG tablet Take 1 tablet (70 mg total) by mouth once a week. Take with a full glass of water  on an empty stomach. 12 tablet 3   amLODipine  (NORVASC ) 2.5 MG tablet TAKE 1 TABLET BY MOUTH DAILY 90 tablet 3   atorvastatin  (LIPITOR) 10 MG tablet TAKE 1 TABLET BY MOUTH DAILY 90 tablet 3   Cholecalciferol (VITAMIN D3) 2000 units TABS Take 2,000 Units by mouth daily.     No current facility-administered medications on file prior to visit.  [2]  Allergies Allergen Reactions   Aspirin Other (See Comments)    GI upset   Codeine Other (See Comments)    GI upset   Other     Rash to ChloraPrep   "

## 2024-08-12 NOTE — Patient Instructions (Signed)
 A few things to remember from today's visit:  Hyperlipidemia, unspecified hyperlipidemia type - Plan: Comprehensive metabolic panel with GFR, Lipid panel  Hypertension, essential, benign - Plan: Comprehensive metabolic panel with GFR No changes today.  If you need refills for medications you take chronically, please call your pharmacy. Do not use My Chart to request refills or for acute issues that need immediate attention. If you send a my chart message, it may take a few days to be addressed, specially if I am not in the office.  Please be sure medication list is accurate. If a new problem present, please set up appointment sooner than planned today.

## 2024-08-12 NOTE — Assessment & Plan Note (Deleted)
 Last LDL 79 in 07/2023. Currently on lovastatin 10 mg daily and low-fat diet, no changes today. Further recommendation will be given according to lipid panel result.

## 2024-08-12 NOTE — Assessment & Plan Note (Signed)
 Last LDL 79 in 07/2023. Currently on lovastatin 10 mg daily and low-fat diet, no changes today. Further recommendation will be given according to lipid panel result.

## 2024-08-13 LAB — LIPID PANEL
Chol/HDL Ratio: 2.1 ratio (ref 0.0–4.4)
Cholesterol, Total: 190 mg/dL (ref 100–199)
HDL: 90 mg/dL
LDL Chol Calc (NIH): 88 mg/dL (ref 0–99)
Triglycerides: 66 mg/dL (ref 0–149)
VLDL Cholesterol Cal: 12 mg/dL (ref 5–40)

## 2024-08-13 LAB — COMPREHENSIVE METABOLIC PANEL WITH GFR
ALT: 12 IU/L (ref 0–32)
AST: 19 IU/L (ref 0–40)
Albumin: 4.9 g/dL (ref 3.9–4.9)
Alkaline Phosphatase: 79 IU/L (ref 49–135)
BUN/Creatinine Ratio: 18 (ref 12–28)
BUN: 12 mg/dL (ref 8–27)
Bilirubin Total: 0.6 mg/dL (ref 0.0–1.2)
CO2: 25 mmol/L (ref 20–29)
Calcium: 9.9 mg/dL (ref 8.7–10.3)
Chloride: 103 mmol/L (ref 96–106)
Creatinine, Ser: 0.65 mg/dL (ref 0.57–1.00)
Globulin, Total: 2.5 g/dL (ref 1.5–4.5)
Glucose: 78 mg/dL (ref 70–99)
Potassium: 3.9 mmol/L (ref 3.5–5.2)
Sodium: 143 mmol/L (ref 134–144)
Total Protein: 7.4 g/dL (ref 6.0–8.5)
eGFR: 96 mL/min/1.73

## 2024-08-14 ENCOUNTER — Ambulatory Visit: Payer: Self-pay | Admitting: Family Medicine

## 2024-11-09 ENCOUNTER — Encounter: Admitting: Obstetrics and Gynecology
